# Patient Record
Sex: Female | Born: 1945
Health system: Southern US, Community
[De-identification: ages and names within clinical notes are randomized; demographics above are authoritative.]

## PROBLEM LIST (undated history)

## (undated) DIAGNOSIS — E785 Hyperlipidemia, unspecified: Secondary | ICD-10-CM

## (undated) DIAGNOSIS — D509 Iron deficiency anemia, unspecified: Secondary | ICD-10-CM

## (undated) DIAGNOSIS — Z85828 Personal history of other malignant neoplasm of skin: Secondary | ICD-10-CM

## (undated) DIAGNOSIS — F329 Major depressive disorder, single episode, unspecified: Secondary | ICD-10-CM

## (undated) DIAGNOSIS — G8929 Other chronic pain: Secondary | ICD-10-CM

## (undated) DIAGNOSIS — G43909 Migraine, unspecified, not intractable, without status migrainosus: Secondary | ICD-10-CM

## (undated) DIAGNOSIS — H269 Unspecified cataract: Secondary | ICD-10-CM

## (undated) DIAGNOSIS — N950 Postmenopausal bleeding: Secondary | ICD-10-CM

## (undated) DIAGNOSIS — F419 Anxiety disorder, unspecified: Secondary | ICD-10-CM

## (undated) DIAGNOSIS — C801 Malignant (primary) neoplasm, unspecified: Secondary | ICD-10-CM

## (undated) DIAGNOSIS — M810 Age-related osteoporosis without current pathological fracture: Secondary | ICD-10-CM

## (undated) DIAGNOSIS — Z9889 Other specified postprocedural states: Secondary | ICD-10-CM

## (undated) DIAGNOSIS — K219 Gastro-esophageal reflux disease without esophagitis: Secondary | ICD-10-CM

## (undated) DIAGNOSIS — K7689 Other specified diseases of liver: Secondary | ICD-10-CM

## (undated) DIAGNOSIS — M8580 Other specified disorders of bone density and structure, unspecified site: Secondary | ICD-10-CM

## (undated) DIAGNOSIS — M1990 Unspecified osteoarthritis, unspecified site: Secondary | ICD-10-CM

## (undated) DIAGNOSIS — F411 Generalized anxiety disorder: Secondary | ICD-10-CM

## (undated) DIAGNOSIS — Z973 Presence of spectacles and contact lenses: Secondary | ICD-10-CM

## (undated) DIAGNOSIS — D649 Anemia, unspecified: Secondary | ICD-10-CM

## (undated) DIAGNOSIS — M25569 Pain in unspecified knee: Secondary | ICD-10-CM

## (undated) DIAGNOSIS — T7840XA Allergy, unspecified, initial encounter: Secondary | ICD-10-CM

## (undated) DIAGNOSIS — C4491 Basal cell carcinoma of skin, unspecified: Secondary | ICD-10-CM

## (undated) DIAGNOSIS — J302 Other seasonal allergic rhinitis: Secondary | ICD-10-CM

## (undated) DIAGNOSIS — F32A Depression, unspecified: Secondary | ICD-10-CM

## (undated) DIAGNOSIS — R9389 Abnormal findings on diagnostic imaging of other specified body structures: Secondary | ICD-10-CM

## (undated) DIAGNOSIS — K635 Polyp of colon: Secondary | ICD-10-CM

## (undated) HISTORY — DX: Allergy, unspecified, initial encounter: T78.40XA

## (undated) HISTORY — DX: Presence of spectacles and contact lenses: Z97.3

## (undated) HISTORY — PX: MOHS SURGERY: SUR867

## (undated) HISTORY — DX: Polyp of colon: K63.5

## (undated) HISTORY — DX: Other specified diseases of liver: K76.89

## (undated) HISTORY — DX: Depression, unspecified: F32.A

## (undated) HISTORY — PX: COLONOSCOPY WITH PROPOFOL: SHX5780

## (undated) HISTORY — PX: APPENDECTOMY: SHX54

## (undated) HISTORY — PX: SKIN CANCER EXCISION: SHX779

## (undated) HISTORY — PX: COLONOSCOPY: SHX174

## (undated) HISTORY — DX: Iron deficiency anemia, unspecified: D50.9

## (undated) HISTORY — PX: ANKLE FRACTURE SURGERY: SHX122

## (undated) HISTORY — DX: Basal cell carcinoma of skin, unspecified: C44.91

## (undated) HISTORY — PX: JOINT REPLACEMENT: SHX530

## (undated) HISTORY — DX: Unspecified osteoarthritis, unspecified site: M19.90

## (undated) HISTORY — DX: Anemia, unspecified: D64.9

## (undated) HISTORY — PX: ORIF WRIST FRACTURE: SHX2133

## (undated) HISTORY — PX: FRACTURE SURGERY: SHX138

## (undated) HISTORY — DX: Age-related osteoporosis without current pathological fracture: M81.0

## (undated) HISTORY — DX: Unspecified cataract: H26.9

## (undated) HISTORY — DX: Major depressive disorder, single episode, unspecified: F32.9

---
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
## 1980-06-04 HISTORY — PX: TUBAL LIGATION: SHX77

## 1998-06-04 HISTORY — PX: ORIF ANKLE FRACTURE: SUR919

## 2000-06-04 HISTORY — PX: BREAST LUMPECTOMY: SHX2

## 2000-06-04 HISTORY — PX: BREAST EXCISIONAL BIOPSY: SUR124

## 2011-02-24 DIAGNOSIS — H9319 Tinnitus, unspecified ear: Secondary | ICD-10-CM | POA: Insufficient documentation

## 2011-02-24 DIAGNOSIS — G43909 Migraine, unspecified, not intractable, without status migrainosus: Secondary | ICD-10-CM | POA: Insufficient documentation

## 2011-02-24 DIAGNOSIS — F411 Generalized anxiety disorder: Secondary | ICD-10-CM | POA: Insufficient documentation

## 2011-02-24 DIAGNOSIS — D249 Benign neoplasm of unspecified breast: Secondary | ICD-10-CM | POA: Insufficient documentation

## 2011-02-26 DIAGNOSIS — I8390 Asymptomatic varicose veins of unspecified lower extremity: Secondary | ICD-10-CM | POA: Insufficient documentation

## 2011-06-05 HISTORY — PX: OTHER SURGICAL HISTORY: SHX169

## 2012-10-02 DIAGNOSIS — B86 Scabies: Secondary | ICD-10-CM | POA: Insufficient documentation

## 2013-01-29 LAB — LIPID PANEL
Cholesterol: 196 mg/dL (ref 0–200)
HDL: 54 mg/dL (ref 35–70)
LDL Cholesterol: 115 mg/dL
Triglycerides: 111 mg/dL (ref 40–160)

## 2013-01-29 LAB — CBC AND DIFFERENTIAL
HCT: 44 % (ref 36–46)
Hemoglobin: 14.4 g/dL (ref 12.0–16.0)
WBC: 4.6 10^3/mL

## 2013-01-29 LAB — T4, FREE
Albumin: 4.3
CO2: 27 mmol/L
Calcium: 9.4 mg/dL
Chloride: 106 mmol/L
Free T4: 0.8
Protein: 7
TSH: 0.818
Vit D, 25-Hydroxy: 31.9

## 2013-01-29 LAB — HEPATIC FUNCTION PANEL
ALT: 18 U/L (ref 7–35)
AST: 20 U/L (ref 13–35)
Alkaline Phosphatase: 43 U/L (ref 25–125)
Bilirubin, Total: 0.7 mg/dL

## 2013-01-29 LAB — BASIC METABOLIC PANEL
BUN: 18 mg/dL (ref 4–21)
Glucose: 105 mg/dL
Potassium: 3.8 mmol/L (ref 3.4–5.3)
Sodium: 140 mmol/L (ref 137–147)

## 2013-02-17 ENCOUNTER — Emergency Department
Admission: EM | Admit: 2013-02-17 | Discharge: 2013-02-17 | Disposition: A | Discharge status: Home / Self Care | Payer: Commercial Managed Care - PPO | Source: Home / Self Care | Attending: Family Medicine | Admitting: Family Medicine

## 2013-02-17 ENCOUNTER — Encounter: Payer: Self-pay | Admitting: *Deleted

## 2013-02-17 DIAGNOSIS — R21 Rash and other nonspecific skin eruption: Secondary | ICD-10-CM

## 2013-02-17 HISTORY — DX: Pain in unspecified knee: M25.569

## 2013-02-17 HISTORY — DX: Migraine, unspecified, not intractable, without status migrainosus: G43.909

## 2013-02-17 HISTORY — DX: Anxiety disorder, unspecified: F41.9

## 2013-02-17 HISTORY — DX: Other chronic pain: G89.29

## 2013-02-17 LAB — COMPREHENSIVE METABOLIC PANEL
ALT: 20 U/L (ref 0–35)
AST: 21 U/L (ref 0–37)
Alkaline Phosphatase: 46 U/L (ref 39–117)
CO2: 29 mEq/L (ref 19–32)
Sodium: 142 mEq/L (ref 135–145)
Total Bilirubin: 0.6 mg/dL (ref 0.3–1.2)
Total Protein: 6.9 g/dL (ref 6.0–8.3)

## 2013-02-17 MED ORDER — PERMETHRIN 5 % EX CREA: TOPICAL_CREAM | Freq: Once | CUTANEOUS | Status: DC

## 2013-02-17 MED ORDER — VALACYCLOVIR HCL 1 G PO TABS: 1000.0000 mg | ORAL_TABLET | Freq: Three times a day (TID) | ORAL | Status: DC

## 2013-02-17 NOTE — ED Notes (Signed)
Pt c/o rash on her buttocks x 1 day.

## 2013-02-17 NOTE — ED Provider Notes (Signed)
CSN: [id number]     Arrival date & time 02/17/13  0940 History   First MD Initiated Contact with Patient 02/17/13 1011     Chief Complaint  Patient presents with  . Rash   HPI  HPI  This patient complains of a RASH  Location: buttocks area   Onset: 1 day  Course: Has been helping with a local camp. Noticed itching on R buttock overnight as well as burning. Had previous diagnosis of scabies in the past. Wanted to make sure that she doesn't have scabies again. + burning over same time frame. Had shingles shot 1-11/2 years ago.    Self-treated with: nothing  Improvement with treatment: n/a  History  Itching: mild  Tenderness: no, burning over para gluteal area   New medications/antibiotics: no  Pet exposure: no  Recent travel or tropical exposure: no  New soaps, shampoos, detergent, clothing: no  Tick/insect exposure: no  Chemical Exposure: no  Red Flags  Feeling ill: no  Fever: no  Facial/tongue swelling/difficulty breathing: no  Diabetic or immunocompromised: no    Past Medical History  Diagnosis Date  . Anxiety   . Migraine   . Knee pain, chronic    Past Surgical History  Procedure Laterality Date  . Appendectomy    . Tubal ligation    . Skin cancer excision    . Upper endoscopy w/ sclerotherapy    . Ankle fracture surgery Left   . Vein surgery Left    Family History  Problem Relation Age of Onset  . Cancer Mother     brain  . Diabetes Father   . Hypertension Father   . CAD Father    History  Substance Use Topics  . Smoking status: Never Smoker   . Smokeless tobacco: Not on file  . Alcohol Use: No   OB History   Grav Para Term Preterm Abortions TAB SAB Ect Mult Living                 Review of Systems  All other systems reviewed and are negative.    Allergies  Excedrin migraine  Home Medications   Current Outpatient Rx  Name  Route  Sig  Dispense  Refill  . sertraline (ZOLOFT) 100 MG tablet   Oral   Take 100 mg by mouth daily.           . SUMAtriptan (IMITREX) 25 MG tablet   Oral   Take 25 mg by mouth every 2 (two) hours as needed for migraine. May repeat in 2 hours if headache persists or recurs.         . valACYclovir (VALTREX) 1000 MG tablet   Oral   Take 1 tablet (1,000 mg total) by mouth 3 (three) times daily.   21 tablet   0    BP 110/68  Pulse 70  Temp(Src) 98.1 F (36.7 C) (Oral)  Resp 16  Wt 189 lb (85.73 kg)  SpO2 97% Physical Exam  Constitutional: She appears well-developed and well-nourished.  HENT:  Head: Normocephalic and atraumatic.  Eyes: Conjunctivae are normal. Pupils are equal, round, and reactive to light.  Neck: Normal range of motion. Neck supple.  Cardiovascular: Normal rate and regular rhythm.   Pulmonary/Chest: Effort normal and breath sounds normal.  Abdominal: Soft.  Musculoskeletal: Normal range of motion.  Neurological: She is alert.  Skin:  ? rash on R lower buttock     ED Course  Procedures (including critical care time) Labs Review Labs Reviewed  VARICELLA ZOSTER ANTIBODY, IGM  VARICELLA ZOSTER ANTIBODY, IGG  HSV(HERPES SIMPLEX VRS) I + II AB-IGM  HSV(HERPES SIMPLEX VRS) I + II AB-IGG  COMPREHENSIVE METABOLIC PANEL   Imaging Review No results found.  MDM   1. Rash and nonspecific skin eruption    No discernible rash on exam. However, given outdoor exposure and recent history will cover her with permethrin for scabies coverage. There is also some concern for patient having a shingles/HSV prodrome given some radicular symptoms. Para gluteal burning does seem to fit distribution of S1/S2, though no definitive rash present currently. Will place patient on course of Valtrex for coverage. Will also check serologies including varicella zoster and HSV. Discussed with patient that if rash develops to come back in such that we can culture any potential lesions. Discuss infectious and dermatologic red flags at length with patient. Followup as needed.    The patient  and/or caregiver has been counseled thoroughly with regard to treatment plan and/or medications prescribed including dosage, schedule, interactions, rationale for use, and possible side effects and they verbalize understanding. Diagnoses and expected course of recovery discussed and will return if not improved as expected or if the condition worsens. Patient and/or caregiver verbalized understanding.         Doree Albee, MD 02/17/13 1046

## 2013-02-18 LAB — HSV(HERPES SIMPLEX VRS) I + II AB-IGG
HSV 1 Glycoprotein G Ab, IgG: 10.74 IV — ABNORMAL HIGH
HSV 2 Glycoprotein G Ab, IgG: 0.17 IV

## 2013-02-18 LAB — VARICELLA ZOSTER ANTIBODY, IGG: Varicella IgG: 2865 Index — ABNORMAL HIGH (ref <135.00)

## 2013-02-19 ENCOUNTER — Telehealth: Payer: Self-pay | Admitting: *Deleted

## 2013-03-03 ENCOUNTER — Ambulatory Visit (INDEPENDENT_AMBULATORY_CARE_PROVIDER_SITE_OTHER): Payer: Commercial Managed Care - PPO | Admitting: Family Medicine

## 2013-03-03 ENCOUNTER — Encounter: Payer: Self-pay | Admitting: Family Medicine

## 2013-03-03 VITALS — BP 116/69 | HR 66 | Ht 70.0 in | Wt 190.0 lb

## 2013-03-03 DIAGNOSIS — R238 Other skin changes: Secondary | ICD-10-CM

## 2013-03-03 DIAGNOSIS — E559 Vitamin D deficiency, unspecified: Secondary | ICD-10-CM

## 2013-03-03 DIAGNOSIS — M25562 Pain in left knee: Secondary | ICD-10-CM

## 2013-03-03 DIAGNOSIS — K7689 Other specified diseases of liver: Secondary | ICD-10-CM

## 2013-03-03 DIAGNOSIS — R233 Spontaneous ecchymoses: Secondary | ICD-10-CM

## 2013-03-03 DIAGNOSIS — F4010 Social phobia, unspecified: Secondary | ICD-10-CM | POA: Insufficient documentation

## 2013-03-03 DIAGNOSIS — L659 Nonscarring hair loss, unspecified: Secondary | ICD-10-CM

## 2013-03-03 DIAGNOSIS — Z0000 Encounter for general adult medical examination without abnormal findings: Secondary | ICD-10-CM

## 2013-03-03 DIAGNOSIS — G43009 Migraine without aura, not intractable, without status migrainosus: Secondary | ICD-10-CM

## 2013-03-03 DIAGNOSIS — Z85828 Personal history of other malignant neoplasm of skin: Secondary | ICD-10-CM

## 2013-03-03 NOTE — Progress Notes (Addendum)
  Subjective:    Patient ID: Lorraine Woods, female    DOB: [birth year], 67 y.o.   MRN: [id number]  HPI Here to estab care. She hs hx of social anxietey, migraine Hx of knee problems.  Had a bakers cyst fupture in her left knee back in July.  Given tramadol for pain relief.  Given volteran.  Given a brace and then got superficial blood clot in her superficial saphenous vein.  Then took baby ASA. Did get a shot and that helped for awhile, about a monbth.   Knee pain is getting worse lately. Hx of meniscal tear in the left knee.   Vit D def - Taking 2000IU daily.   Migraine - usually less than once a mohth overall but sometime they are clusteredd.  Imitrex works 100% of time.   Has felt more off balance for 6 months.  Notice it more when she gets up. Notices worse when bends over to file things.  No ear pain or pressure. Increase in headaches. No true room spinning or lightheadedness. Review of Systems  Constitutional: Negative for fever, diaphoresis and unexpected weight change.  HENT: Positive for rhinorrhea and tinnitus. Negative for hearing loss.   Eyes: Negative for visual disturbance.  Respiratory: Negative for cough and wheezing.   Cardiovascular: Negative for chest pain and palpitations.  Gastrointestinal: Negative for nausea, vomiting, diarrhea and blood in stool.  Genitourinary: Positive for enuresis. Negative for vaginal bleeding, vaginal discharge and difficulty urinating.  Musculoskeletal: Negative for myalgias and arthralgias.  Skin: Negative for rash.  Neurological: Positive for headaches.  Hematological: Negative for adenopathy. Bruises/bleeds easily.  Psychiatric/Behavioral: Negative for sleep disturbance and dysphoric mood. The patient is nervous/anxious.        Objective:   Physical Exam  Constitutional: She is oriented to person, place, and time. She appears well-developed and well-nourished.  HENT:  Head: Normocephalic and atraumatic.  Right Ear: External ear normal.   Left Ear: External ear normal.  Nose: Nose normal.  Tympanic membranes and canals are clear bilaterally. No fluid.  Eyes: Conjunctivae and EOM are normal. Pupils are equal, round, and reactive to light.  Wearing lenses  Neck: Neck supple. No thyromegaly present.  Cardiovascular: Normal rate, regular rhythm and normal heart sounds.   Pulmonary/Chest: Effort normal and breath sounds normal.  Lymphadenopathy:    She has no cervical adenopathy.  Neurological: She is alert and oriented to person, place, and time. No cranial nerve deficit.  Negative Dix-Hallpike maneuver.  Skin: Skin is warm and dry.  Psychiatric: She has a normal mood and affect. Her behavior is normal.          Assessment & Plan:  Vitamin D deficiency-due to recheck levels. Continue 2000 IUs daily.  Migraine-well controlled. Most headaches are once or less per month. Imitrex works well for her. She's not read refills today.  Balance problems-unclear etiology. No fluid on ear exam. Negative Dix-Hallpike maneuver today. Please let me know if changing or getting worse. Blood pressure looks great area  Social anxiety-well-controlled on Zoloft. She's been on for years and is tolerating it well without any side effects or problems.  History of liver cyst-will check liver enzymes.  She will schedule a followup physical in about 3 weeks.  Knee pain-she did get significant relief with the steroid injection. We'll give her the card for my partner Dr. Rodney Langton if she decides she would like to have another injection. Did discuss with her that we do limit these per year

## 2013-03-05 ENCOUNTER — Encounter: Payer: Self-pay | Admitting: *Deleted

## 2013-03-06 LAB — CBC
HCT: 39.8 % (ref 36.0–46.0)
Hemoglobin: 13.8 g/dL (ref 12.0–15.0)
MCH: 29.1 pg (ref 26.0–34.0)
MCHC: 34.7 g/dL (ref 30.0–36.0)
RBC: 4.75 MIL/uL (ref 3.87–5.11)

## 2013-03-06 LAB — COMPLETE METABOLIC PANEL WITH GFR
Alkaline Phosphatase: 43 U/L (ref 39–117)
BUN: 21 mg/dL (ref 6–23)
CO2: 27 mEq/L (ref 19–32)
Creat: 0.72 mg/dL (ref 0.50–1.10)
GFR, Est African American: >89 mL/min
GFR, Est Non African American: 88 mL/min
Glucose, Bld: 85 mg/dL (ref 70–99)
Sodium: 141 mEq/L (ref 135–145)
Total Bilirubin: 0.7 mg/dL (ref 0.3–1.2)

## 2013-03-06 LAB — LIPID PANEL
Cholesterol: 188 mg/dL (ref 0–200)
HDL: 53 mg/dL (ref >39)
Total CHOL/HDL Ratio: 3.5 Ratio

## 2013-03-24 ENCOUNTER — Encounter: Payer: Self-pay | Admitting: Family Medicine

## 2013-03-24 ENCOUNTER — Other Ambulatory Visit (HOSPITAL_COMMUNITY)
Admission: RE | Admit: 2013-03-24 | Discharge: 2013-03-24 | Disposition: A | Discharge status: Home / Self Care | Payer: Commercial Managed Care - PPO | Source: Ambulatory Visit | Attending: Family Medicine | Admitting: Family Medicine

## 2013-03-24 ENCOUNTER — Ambulatory Visit (INDEPENDENT_AMBULATORY_CARE_PROVIDER_SITE_OTHER): Payer: Commercial Managed Care - PPO | Admitting: Family Medicine

## 2013-03-24 VITALS — BP 125/73 | HR 66 | Wt 191.0 lb

## 2013-03-24 DIAGNOSIS — N889 Noninflammatory disorder of cervix uteri, unspecified: Secondary | ICD-10-CM

## 2013-03-24 DIAGNOSIS — Z1151 Encounter for screening for human papillomavirus (HPV): Secondary | ICD-10-CM | POA: Insufficient documentation

## 2013-03-24 DIAGNOSIS — N760 Acute vaginitis: Secondary | ICD-10-CM

## 2013-03-24 DIAGNOSIS — Z0000 Encounter for general adult medical examination without abnormal findings: Secondary | ICD-10-CM

## 2013-03-24 DIAGNOSIS — Z01419 Encounter for gynecological examination (general) (routine) without abnormal findings: Secondary | ICD-10-CM | POA: Insufficient documentation

## 2013-03-24 MED ORDER — SERTRALINE HCL 100 MG PO TABS: 100.0000 mg | ORAL_TABLET | Freq: Every day | ORAL | Status: DC

## 2013-03-24 MED ORDER — SUMATRIPTAN SUCCINATE 100 MG PO TABS: 100.0000 mg | ORAL_TABLET | ORAL | Status: DC | PRN

## 2013-03-24 NOTE — Patient Instructions (Signed)
Keep up a regular exercise program and make sure you are eating a healthy diet Try to eat 4 servings of dairy a day, or if you are lactose intolerant take a calcium with vitamin D daily.  Your vaccines are up to date.   

## 2013-03-24 NOTE — Progress Notes (Signed)
  Subjective:     Lorraine Woods is a 67 y.o. female and is here for a comprehensive physical exam. The patient reports no problems. Has had some vaginal itching for the past 2-3 days.  History   Social History  . Marital Status: Married    Spouse Name: John     Number of Children: 2  . Years of Education: N/A   Occupational History  . Echo sonographer     [employer] [facility]   Social History Main Topics  . Smoking status: Never Smoker   . Smokeless tobacco: Not on file  . Alcohol Use: Yes  . Drug Use: No  . Sexual Activity: Not on file   Other Topics Concern  . Not on file   Social History Narrative  . No narrative on file   Health Maintenance  Topic Date Due  . Pneumococcal Polysaccharide Vaccine Age 63 And Over  08/10/2011  . Influenza Vaccine  01/02/2014  . Mammogram  06/04/2014  . Colonoscopy  06/04/2016  . Tetanus/tdap  03/05/2019  . Zostavax  Completed    The following portions of the patient's history were reviewed and updated as appropriate: allergies, current medications, past family history, past medical history, past social history, past surgical history and problem list.  Review of Systems A comprehensive review of systems was negative.   Objective:     BP 125/73  Pulse 66  Wt 191 lb (86.637 kg)  BMI 27.41 kg/m2     BP 125/73  Pulse 66  Wt 191 lb (86.637 kg)  BMI 27.41 kg/m2 General appearance: alert, cooperative and appears stated age Head: Normocephalic, without obvious abnormality, atraumatic Eyes: conj clear, EOMi, PEERLA Ears: normal TM's and external ear canals both ears Nose: Nares normal. Septum midline. Mucosa normal. No drainage or sinus tenderness. Throat: lips, mucosa, and tongue normal; teeth and gums normal Neck: no adenopathy, no carotid bruit, no JVD, supple, symmetrical, trachea midline and thyroid not enlarged, symmetric, no tenderness/mass/nodules Back: symmetric, no curvature. ROM normal. No CVA tenderness. Lungs:  clear to auscultation bilaterally Breasts: normal appearance, no masses or tenderness Heart: regular rate and rhythm, S1, S2 normal, no murmur, click, rub or gallop Abdomen: soft, non-tender; bowel sounds normal; no masses,  no organomegaly Pelvic: external genitalia normal, no adnexal masses or tenderness, no cervical motion tenderness, rectovaginal septum normal, uterus normal size, shape, and consistency, vagina normal without discharge and cervix with a peduncular lesion at the 8 o'clock position Extremities: extremities normal, atraumatic, no cyanosis or edema Pulses: 2+ and symmetric Skin: Skin color, texture, turgor normal. No rashes or lesions Lymph nodes: Cervical, supraclavicular, and axillary nodes normal. Neurologic: Alert and oriented X 3, normal strength and tone. Normal symmetric reflexes. Normal coordination and gait  .   Assessment:    Healthy female exam.      Plan:     See After Visit Summary for Counseling Recommendations  Keep up a regular exercise program and make sure you are eating a healthy diet Try to eat 4 servings of dairy a day, or if you are lactose intolerant take a calcium with vitamin D daily.  Your vaccines are up to date.   Vaginitis- wet prep sent to evaluate for yeast infection.  Pap smear performed, will call with results as well.  Lesion on the cervix-will refer to GYN for further evaluation and possible biopsy.

## 2013-03-25 ENCOUNTER — Telehealth: Payer: Self-pay

## 2013-03-25 LAB — WET PREP, GENITAL
Clue Cells Wet Prep HPF POC: NONE SEEN
Trich, Wet Prep: NONE SEEN
Yeast Wet Prep HPF POC: NONE SEEN

## 2013-03-25 NOTE — Telephone Encounter (Signed)
Left message for patient that we received referral from Dr. Darra Lis and to call office to schedule appt.

## 2013-04-07 ENCOUNTER — Ambulatory Visit (INDEPENDENT_AMBULATORY_CARE_PROVIDER_SITE_OTHER): Payer: Commercial Managed Care - PPO | Admitting: Obstetrics & Gynecology

## 2013-04-07 ENCOUNTER — Encounter: Payer: Self-pay | Admitting: Obstetrics & Gynecology

## 2013-04-07 VITALS — BP 130/73 | HR 69 | Resp 16 | Ht 70.0 in | Wt 192.0 lb

## 2013-04-07 DIAGNOSIS — L293 Anogenital pruritus, unspecified: Secondary | ICD-10-CM

## 2013-04-07 DIAGNOSIS — N841 Polyp of cervix uteri: Secondary | ICD-10-CM

## 2013-04-07 DIAGNOSIS — L292 Pruritus vulvae: Secondary | ICD-10-CM

## 2013-04-07 MED ORDER — CLOBETASOL PROPIONATE 0.05 % EX OINT: TOPICAL_OINTMENT | CUTANEOUS | Status: DC

## 2013-04-07 NOTE — Progress Notes (Addendum)
  Subjective:    Patient ID: Lorraine Woods, female    DOB: [birth year], 67 y.o.   MRN: [id number]  HPI  Pt had yearly exam with Dr. Eppie Gibson recently and noted to have a cervical lesion.  Pt also c/o 2 weeks of vulvar itching.  Wet prep was negative.  Pt also c/o urinary incontinence and must wear a thin pad.  Pt is not sexually active at this time.  Review of Systems  Constitutional: Negative.   Respiratory: Negative.   Cardiovascular: Negative.   Gastrointestinal: Negative.   Genitourinary:       Urinary incont Vulvar itching  Psychiatric/Behavioral: Negative.        Objective:   Physical Exam  Vitals reviewed. Constitutional: She is oriented to person, place, and time. She appears well-developed and well-nourished. No distress.  HENT:  Head: Normocephalic and atraumatic.  Eyes: Conjunctivae are normal.  Pulmonary/Chest: Effort normal.  Abdominal: Soft.  Genitourinary: Vagina normal. No vaginal discharge found.  Vulvar normal Suspected endocervical polyp at 9 o'clock  Neurological: She is alert and oriented to person, place, and time.  Skin: Skin is warm and dry.  Psychiatric: She has a normal mood and affect.          Assessment & Plan:  67 yo female with   1-Endocervical polyp seen on exam--removed with Kevorkian.  Silver nitrate applied for hemostasis. 2-vulvar itching--No lesion seen.  Temovate for 3 weeks with taper.  RTC if not improving 3-Urinary incontinence--kegal exercises reviewed.

## 2013-04-16 ENCOUNTER — Encounter: Payer: Self-pay | Admitting: Obstetrics & Gynecology

## 2013-04-20 ENCOUNTER — Telehealth: Payer: Self-pay | Admitting: *Deleted

## 2013-04-20 NOTE — Telephone Encounter (Signed)
Pt was notified by Janit Bern, CMA of pathology results.

## 2013-04-21 ENCOUNTER — Encounter: Payer: Self-pay | Admitting: Obstetrics & Gynecology

## 2013-04-28 ENCOUNTER — Telehealth: Payer: Self-pay | Admitting: *Deleted

## 2013-04-28 NOTE — Telephone Encounter (Signed)
Pt notified of benign cervical polyp. 

## 2013-04-28 NOTE — Telephone Encounter (Signed)
Message copied by Granville Lewis on Tue Apr 28, 2013  9:46 AM ------      Message from: Lesly Dukes      Created: Tue Apr 28, 2013  7:18 AM       I was waiting fo r a relook.  Never got it from the pathologist.  Call her nad tell her all is normal.  No vaginitis nor precancer cells ------

## 2013-05-26 ENCOUNTER — Encounter: Payer: Self-pay | Admitting: Family Medicine

## 2013-05-27 ENCOUNTER — Other Ambulatory Visit: Payer: Self-pay | Admitting: Family Medicine

## 2013-05-29 ENCOUNTER — Telehealth: Payer: Self-pay | Admitting: *Deleted

## 2013-05-29 MED ORDER — SERTRALINE HCL 100 MG PO TABS: ORAL_TABLET | ORAL | Status: DC

## 2013-05-29 NOTE — Telephone Encounter (Signed)
Refill sent.Lorraine Woods [name]  

## 2013-07-22 ENCOUNTER — Encounter: Payer: Self-pay | Admitting: Family Medicine

## 2013-07-24 ENCOUNTER — Telehealth: Payer: Self-pay | Admitting: Family Medicine

## 2013-07-24 DIAGNOSIS — K7689 Other specified diseases of liver: Secondary | ICD-10-CM

## 2013-07-24 DIAGNOSIS — R1011 Right upper quadrant pain: Secondary | ICD-10-CM

## 2013-07-24 NOTE — Telephone Encounter (Signed)
Seth Bake, Will you please let Mrs. Rosebrook know that Dr. Jerilynn Mages is out on leave for a family emergency.  As coverage for her I'd encourage her to have a formal ultrasound of her liver so that we have a report on file.  Additionally, I'll send a referral to GI requesting a hepatic sub-specialist consult.  Please let us know if she has not been contacted about this by next week.

## 2013-07-24 NOTE — Telephone Encounter (Signed)
Left message on pt.'s vm.

## 2013-07-27 ENCOUNTER — Telehealth: Payer: Self-pay | Admitting: Internal Medicine

## 2013-07-27 NOTE — Telephone Encounter (Signed)
Left a message for patient to call me. 

## 2013-07-28 ENCOUNTER — Ambulatory Visit (INDEPENDENT_AMBULATORY_CARE_PROVIDER_SITE_OTHER): Payer: Commercial Managed Care - PPO

## 2013-07-28 ENCOUNTER — Telehealth: Payer: Self-pay | Admitting: Family Medicine

## 2013-07-28 DIAGNOSIS — K7689 Other specified diseases of liver: Secondary | ICD-10-CM

## 2013-07-28 DIAGNOSIS — R932 Abnormal findings on diagnostic imaging of liver and biliary tract: Secondary | ICD-10-CM

## 2013-07-28 DIAGNOSIS — R1011 Right upper quadrant pain: Secondary | ICD-10-CM

## 2013-07-28 NOTE — Telephone Encounter (Signed)
Pt notified of results; pt states when [facility] finally called her they insisted that she see Dr. Olevia Perches because that she saw her last. The pt last saw this Dr. Elwin Mocha years ago... And her first avail appt is not until may. Pt wanted to know if another referral could be place somewhere else. (maybe [facility]?)

## 2013-07-28 NOTE — Telephone Encounter (Signed)
Seth Bake, Will you please let Mrs. Jasper know that her formal ultrasound confirmed the presence of two cysts in the liver 11x10cm and 3.5x3.8cm.  The reading radiologist has recommended a CT scan of the abdomen which I've placed an order for in hopes that this makes her upcoming gastroenterology consult more productive.

## 2013-07-28 NOTE — Telephone Encounter (Signed)
New referral has been placed.  °

## 2013-07-28 NOTE — Telephone Encounter (Signed)
Called and the phone rang several times with no vm;will try later

## 2013-07-29 ENCOUNTER — Telehealth: Payer: Self-pay | Admitting: *Deleted

## 2013-07-29 NOTE — Telephone Encounter (Signed)
Spoke with patient and she states she is having a CT scan later this week or early next week. Scheduled OV on 08/07/13 at 2:00 PM with Dr. Olevia Perches.

## 2013-07-29 NOTE — Telephone Encounter (Signed)
Pt called and states she was able to get an appt with Dr. Olevia Perches next Friday she did get a call from [place] specialist also. She wanted to know if it was ok to go ahead and see Dr. Olevia Perches since that was who she preferred...Marland KitchenMarland KitchenShe can see whoever she wishes to see. Called home # and no answer. Closing the encounter

## 2013-07-29 NOTE — Telephone Encounter (Signed)
No PA required for CT ABD w/contrast since it is outpatient per [facility].  Oscar La, LPN

## 2013-07-31 ENCOUNTER — Telehealth: Payer: Self-pay | Admitting: *Deleted

## 2013-07-31 ENCOUNTER — Encounter: Payer: Self-pay | Admitting: *Deleted

## 2013-07-31 DIAGNOSIS — K7689 Other specified diseases of liver: Secondary | ICD-10-CM

## 2013-07-31 NOTE — Telephone Encounter (Signed)
Needs labs for CT. Pt is going to lab today

## 2013-08-01 LAB — BASIC METABOLIC PANEL WITH GFR
BUN: 17 mg/dL (ref 6–23)
CHLORIDE: 104 meq/L (ref 96–112)
CO2: 29 meq/L (ref 19–32)
Calcium: 9.4 mg/dL (ref 8.4–10.5)
Creat: 0.75 mg/dL (ref 0.50–1.10)
GFR, Est African American: >89 mL/min
GFR, Est Non African American: 83 mL/min
Glucose, Bld: 93 mg/dL (ref 70–99)
Potassium: 4.1 mEq/L (ref 3.5–5.3)
SODIUM: 140 meq/L (ref 135–145)

## 2013-08-04 ENCOUNTER — Encounter (HOSPITAL_BASED_OUTPATIENT_CLINIC_OR_DEPARTMENT_OTHER): Payer: Self-pay

## 2013-08-04 ENCOUNTER — Encounter: Payer: Self-pay | Admitting: Family Medicine

## 2013-08-04 ENCOUNTER — Ambulatory Visit (HOSPITAL_BASED_OUTPATIENT_CLINIC_OR_DEPARTMENT_OTHER)
Admission: RE | Admit: 2013-08-04 | Discharge: 2013-08-04 | Disposition: A | Discharge status: Home / Self Care | Payer: Commercial Managed Care - PPO | Source: Ambulatory Visit | Attending: Family Medicine | Admitting: Family Medicine

## 2013-08-04 DIAGNOSIS — K7689 Other specified diseases of liver: Secondary | ICD-10-CM | POA: Insufficient documentation

## 2013-08-04 DIAGNOSIS — R911 Solitary pulmonary nodule: Secondary | ICD-10-CM | POA: Insufficient documentation

## 2013-08-04 HISTORY — DX: Malignant (primary) neoplasm, unspecified: C80.1

## 2013-08-04 IMAGING — CT CT ABDOMEN W/ CM
2 of 5 series · 15 of 46 positions shown, 17 images · IV contrast (APPLIED)
Comparison: US ABDOMEN COMPLETE dated [DATE]

CLINICAL DATA: Right upper quadrant abdominal pain. Follow-up
hepatic cysts demonstrated on ultrasound. History of nasal basal
cell carcinoma.

EXAM:
CT ABDOMEN WITH CONTRAST
TECHNIQUE: Multidetector CT imaging of the abdomen was performed using the
standard protocol following bolus administration of intravenous
contrast.
CONTRAST:  100mL OMNIPAQUE IOHEXOL 300 MG/ML  SOLN

[Series 2: abd/pelvis 5.0 b31f · axial · 0.82mm/px · z∈[-252,+8]mm · 12 of 61 slices shown, 14 images]
[im 5/61  soft-tissue]
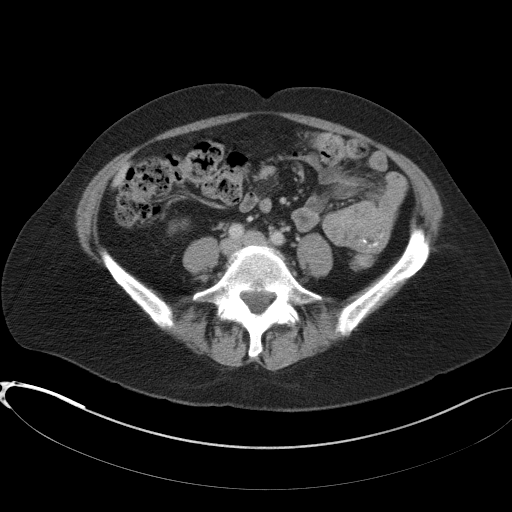
[im 5/61  bone]
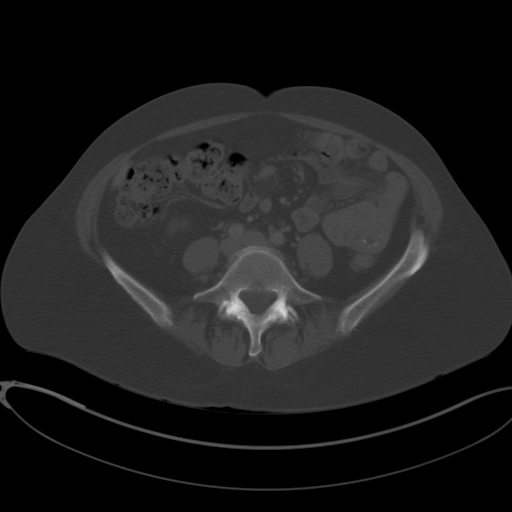
[im 9/61  soft-tissue]
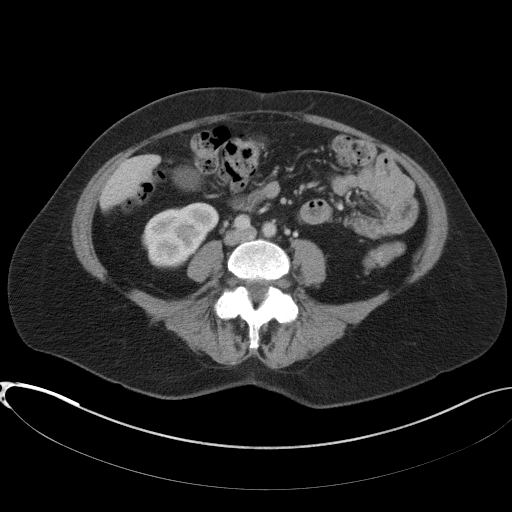
[im 13/61  soft-tissue]
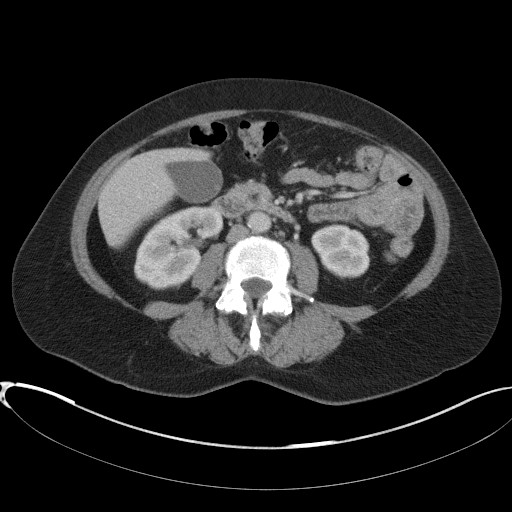
[im 21/61  soft-tissue]
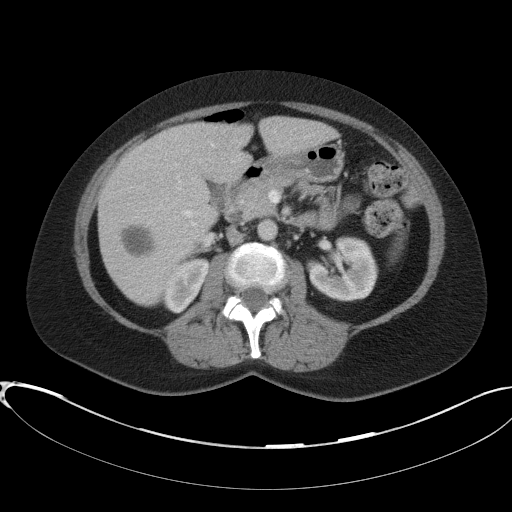
[im 25/61  soft-tissue]
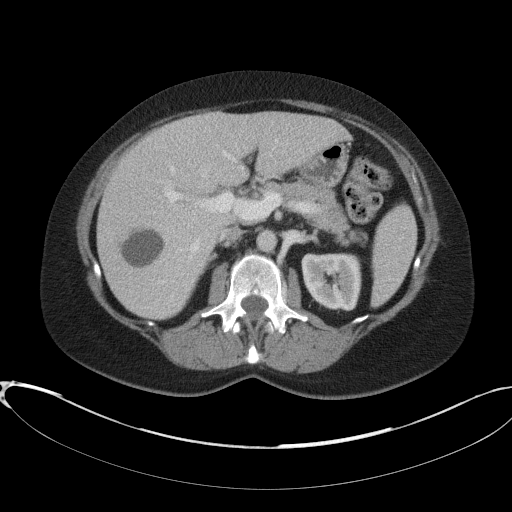
[im 29/61  soft-tissue]
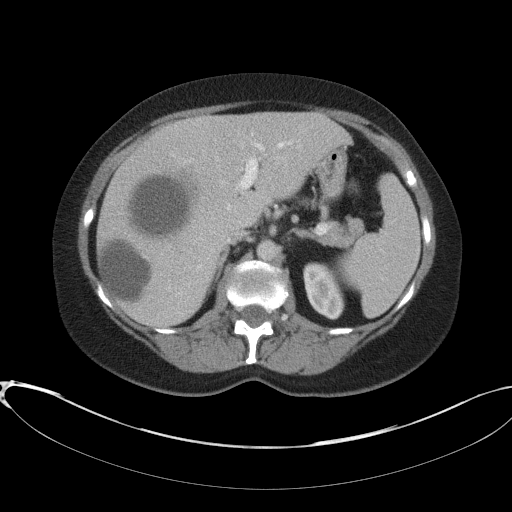
[im 33/61  soft-tissue]
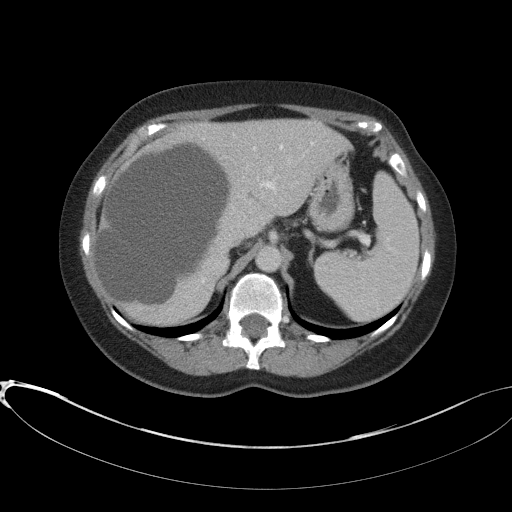
[im 37/61  soft-tissue]
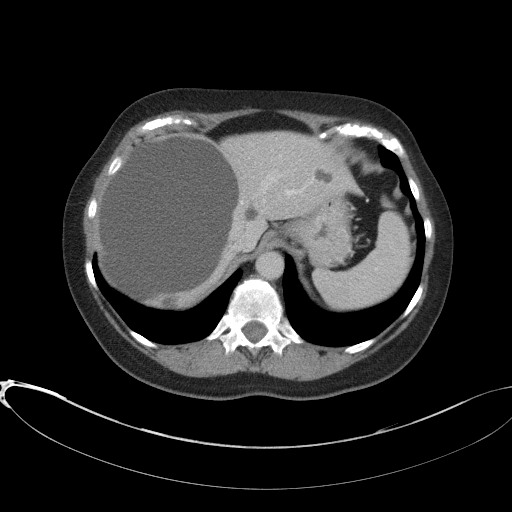
[im 41/61  soft-tissue]
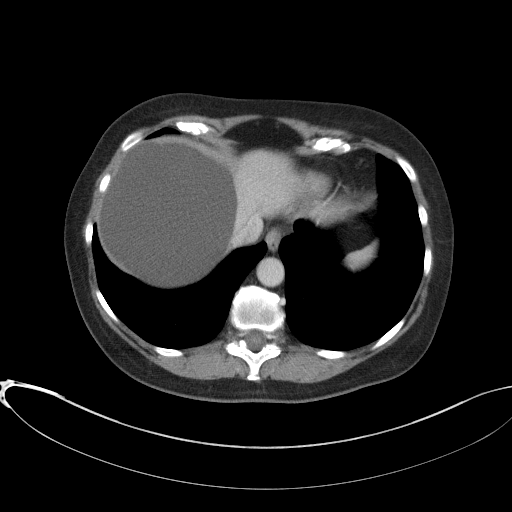
[im 41/61  bone]
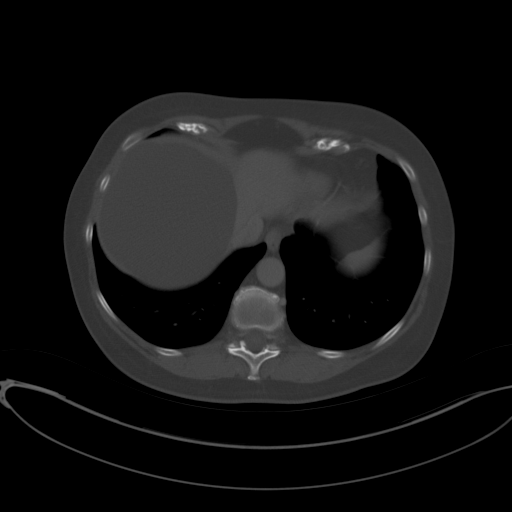
[im 49/61  soft-tissue]
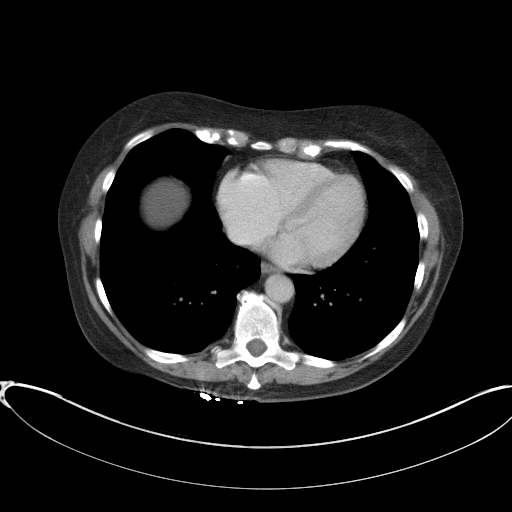
[im 53/61  soft-tissue]
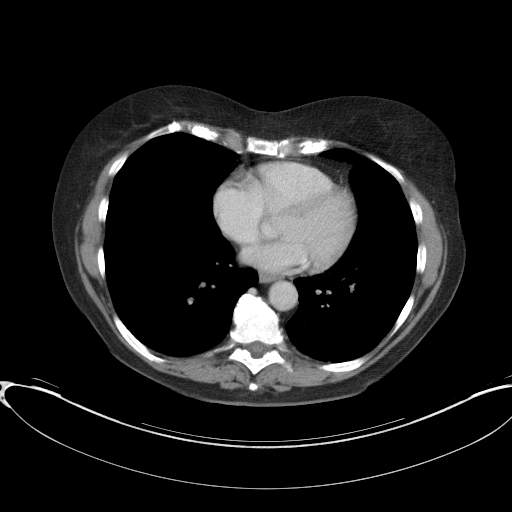
[im 57/61  soft-tissue]
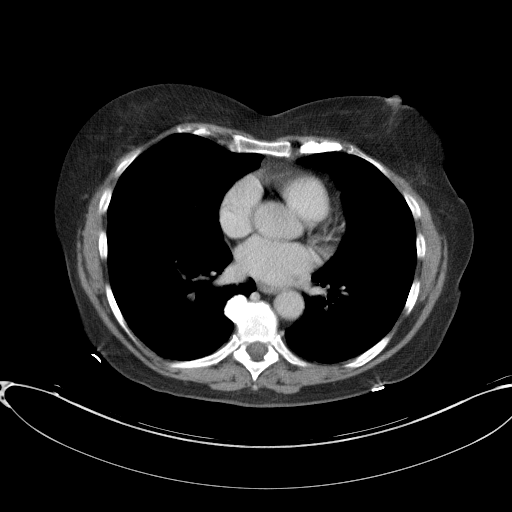

[Series 5: abd/pelvis 3.0 coronal · coronal · 0.70mm/px · 3 of 94 slices shown]
[im 32/94  soft-tissue]
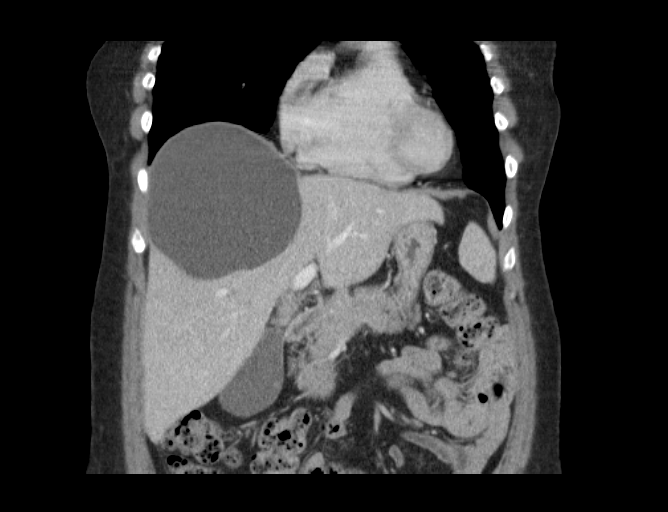
[im 42/94  soft-tissue]
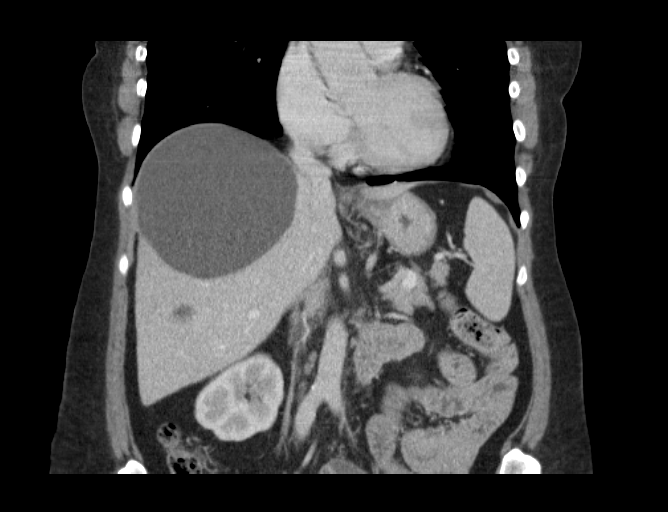
[im 52/94  soft-tissue]
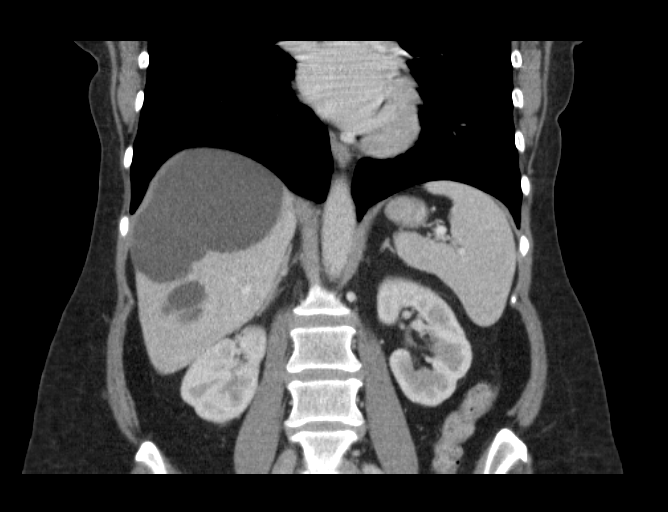

[15 of 46 positions shown; findings below may reference images not displayed]

FINDINGS: There is a 7 mm left lower lobe nodule on image 7. This appears
somewhat tubular and could be vascular; its evaluation is mildly
limited by motion. No other basilar pulmonary nodules are
identified. There is mild dependent atelectasis or scarring in both
lung bases. There is no significant pleural or pericardial effusion.

Multiple hepatic cysts are demonstrated. The dominant lesion is in
the dome of the right hepatic lobe, measuring 11.2 by 12.9 cm on
image 24. There is an immediately adjacent mildly septated cystic
lesion measuring 7.6 x 4.7 cm on image 30. There is a 3.8 cm cystic
lesion inferiorly in the right hepatic lobe on image 39. Multiple
other smaller lesions are identified. No enhancing liver lesions,
thickened septations or associated calcifications are evident.

There is a possible small gallbladder polyp on image 49,
corresponding with the ultrasound finding. The gallbladder otherwise
appears normal. There is no biliary dilatation. There are no
suspicious pancreatic findings. There is invagination of
retroperitoneal fat between the pancreatic body and the duodenum on
axial images 46 through 48. There is no pancreatic ductal
dilatation. The spleen appears normal.

Both adrenal glands and kidneys appear normal. There is no
hydronephrosis.

The stomach and visualized portions of the small bowel and colon
appear normal. No inflammatory changes or enlarged lymph nodes are
demonstrated.

There are degenerative changes within the lower lumbar spine. No
worrisome osseous findings are demonstrated.

The pelvis was not imaged.
IMPRESSION: 1. There are multiple hepatic cysts, some of which demonstrate thin
septations. No aggressive features are demonstrated.
2. Possible small gallbladder polyp as correlated with prior
ultrasound.
3. Left lower lobe pulmonary nodule versus small AVM. If the patient
is at high risk for bronchogenic carcinoma, follow-up chest CT at
3-[PO] is recommended. If the patient is at low risk for
bronchogenic carcinoma, follow-up chest CT at 6-12 months is
recommended. This recommendation follows the consensus statement:
Guidelines for Management of Small Pulmonary Nodules Detected on CT
Scans: A Statement from the [HOSPITAL] as published in

## 2013-08-04 MED ORDER — IOHEXOL 300 MG/ML  SOLN
100.0000 mL | Freq: Once | INTRAMUSCULAR | Status: AC | PRN
  Administered 2013-08-04: 100 mL via INTRAVENOUS

## 2013-08-07 ENCOUNTER — Ambulatory Visit (INDEPENDENT_AMBULATORY_CARE_PROVIDER_SITE_OTHER): Payer: Commercial Managed Care - PPO | Admitting: Internal Medicine

## 2013-08-07 ENCOUNTER — Other Ambulatory Visit (INDEPENDENT_AMBULATORY_CARE_PROVIDER_SITE_OTHER): Payer: Commercial Managed Care - PPO

## 2013-08-07 ENCOUNTER — Encounter: Payer: Self-pay | Admitting: Internal Medicine

## 2013-08-07 ENCOUNTER — Ambulatory Visit: Payer: Commercial Managed Care - PPO | Admitting: Sports Medicine

## 2013-08-07 VITALS — BP 128/70 | HR 80 | Ht 69.0 in | Wt 192.4 lb

## 2013-08-07 DIAGNOSIS — R1011 Right upper quadrant pain: Secondary | ICD-10-CM

## 2013-08-07 DIAGNOSIS — K7689 Other specified diseases of liver: Secondary | ICD-10-CM

## 2013-08-07 LAB — HEPATIC FUNCTION PANEL
ALT: 19 U/L (ref 0–35)
AST: 24 U/L (ref 0–37)
Albumin: 4.3 g/dL (ref 3.5–5.2)
Alkaline Phosphatase: 38 U/L — ABNORMAL LOW (ref 39–117)
BILIRUBIN DIRECT: 0.2 mg/dL (ref 0.0–0.3)
BILIRUBIN TOTAL: 0.8 mg/dL (ref 0.3–1.2)
Total Protein: 7.3 g/dL (ref 6.0–8.3)

## 2013-08-07 LAB — AMYLASE: Amylase: 66 U/L (ref 27–131)

## 2013-08-07 LAB — LIPASE: Lipase: 34 U/L (ref 11.0–59.0)

## 2013-08-07 NOTE — Progress Notes (Signed)
Lorraine Woods [facility] 1945-06-21 [id number]  Note: This dictation was prepared with Dragon digital system. Any transcriptional errors that result from this procedure are unintentional.   History of Present Illness:  This is a 68 year old white female who had a sudden onset of right upper quadrant pain localized to the right costal margin and radiating to the back. The pain  started about 3 weeks ago. It is relieved when she lays down at night and it is worse when she is up, if she coughs or if she reaches with her arms or bends over. It is not associated with eating or with having bowel movements. An upper abdominal ultrasound showed a gallbladder polyp, normal common bile duct of 4.3 mm and large cyst in the right lobe of the liver. A subsequent CT scan of the abdomen on 07/28/2013 showed multiple hepatic cysts, the largest one in the right lobe was 11.2 x12.9 cm with adjacent septated cysts of 7.6 cm and 4.7 cm. There was another 3.8 cm cyst in the right lobe. I saw the patient about 25 years ago for unrelated problems and at that time an upper abdominal ultrasound showed a small liver cyst. She had a screening colonoscopy about 7 years ago. Liver function tests in September and October 2014 were normal. She doesn't drink excessive alcohol. There is no history of liver disease or hepatitis.    Past Medical History  Diagnosis Date  . Anxiety   . Migraine   . Knee pain, chronic   . Wears glasses   . Liver cyst   . Basal cell carcinoma     nose    Past Surgical History  Procedure Laterality Date  . Appendectomy  1959  . Tubal ligation  1982  . Skin cancer excision    . Upper endoscopy w/ sclerotherapy    . Ankle fracture surgery Left   . Vein surgery Left   . Breast lumpectomy Right     on HRT, fibromadenoma    Allergies  Allergen Reactions  . Excedrin Migraine [Aspirin-Acetaminophen-Caffeine] Other (See Comments)    Bruising     Family history and social history have been  reviewed.  Review of Systems: Negative for dyspepsia dysphagia heartburn  The remainder of the 10 point ROS is negative except as outlined in the H&P  Physical Exam: General Appearance Well developed, in no distress healthy appearing Eyes  Non icteric  HEENT  Non traumatic, normocephalic  Mouth No lesion, tongue papillated, no cheilosis Neck Supple without adenopathy, thyroid not enlarged, no carotid bruits, no JVD Lungs Clear to auscultation bilaterally COR Normal S1, normal S2, regular rhythm, no murmur, quiet precordium Abdomen soft nontender with normoactive bowel sounds. Liver edge at costal margin. Overall span about 15 cm by percussion. No CVA tenderness, no tenderness of the liver area and spleen not enlarged Rectal not done Extremities  No pedal edema Skin No lesions Neurological Alert and oriented x 3 Psychological Normal mood and affect  Assessment and Plan:   Problem #1 interval enlargement of hepatic cysts; the largest being 11.2x12.9 cm in the right lobe of the liver. Her symptoms are likely related to the pressure from the cysts. The RUQ abd.pain is not associated with meals or bowel function. The pain feels like a pressure and it is partly positional.The patient has not taken any estrogens. We will repeat liver function tests today. I feel that the cyst should  be assessed from a surgical standpoint. In order to prevent the dominant cyst from accumulating  it may have  To be  unroofed. We will obtain a surgical opinion from Dr. Barry Dienes. Patient agrees with the plan.     [name] 08/07/2013

## 2013-08-07 NOTE — Patient Instructions (Signed)
Your physician has requested that you go to the basement for the following lab work before leaving today: Hepatic Function, Amylase, Lipase  You have been scheduled for an appointment with Dr Barry Dienes at [facility]. Your appointment is on __________ at _________. Please arrive at __________ for registration. Make certain to bring a list of current medications, including any over the counter medications or vitamins. Also bring your co-pay if you have one as well as your insurance cards. [facility] is located at [address], [address]. Should you need to reschedule your appointment, please contact them at [phone].  CC: Dr [name], Dr Barry Dienes

## 2013-08-11 ENCOUNTER — Telehealth: Payer: Self-pay | Admitting: Internal Medicine

## 2013-08-11 DIAGNOSIS — K7689 Other specified diseases of liver: Secondary | ICD-10-CM

## 2013-08-11 NOTE — Telephone Encounter (Signed)
I have advised patient that per [facility] @  [facility], new patient referral coordinator has been out sick and they are "back logged" on appointments at this time. I advised that I will be in contact with them about appointment when we have gotten time and date.

## 2013-08-13 NOTE — Telephone Encounter (Signed)
I have spoken to [facility] @ [place] and she states that patient's information has been sent to [facility], Dr Marlowe Aschoff nurse to get patient worked in sooner for an appointment. I have advised patient's husband of this.

## 2013-08-17 ENCOUNTER — Encounter (INDEPENDENT_AMBULATORY_CARE_PROVIDER_SITE_OTHER): Payer: Self-pay | Admitting: General Surgery

## 2013-08-17 ENCOUNTER — Ambulatory Visit (INDEPENDENT_AMBULATORY_CARE_PROVIDER_SITE_OTHER): Payer: Commercial Managed Care - PPO | Admitting: General Surgery

## 2013-08-17 VITALS — BP 118/68 | HR 80 | Temp 100.0°F | Resp 14 | Ht 70.0 in | Wt 191.8 lb

## 2013-08-17 DIAGNOSIS — K824 Cholesterolosis of gallbladder: Secondary | ICD-10-CM

## 2013-08-17 DIAGNOSIS — K7689 Other specified diseases of liver: Secondary | ICD-10-CM

## 2013-08-17 NOTE — Patient Instructions (Signed)
Laparoscopic Cholecystectomy Laparoscopic cholecystectomy is surgery to remove the gallbladder. The gallbladder is located in the upper right part of the abdomen, behind the liver. It is a storage sac for bile produced in the liver. Bile aids in the digestion and absorption of fats. Cholecystectomy is often done for inflammation of the gallbladder (cholecystitis). This condition is usually caused by a buildup of gallstones (cholelithiasis) in your gallbladder. Gallstones can block the flow of bile, resulting in inflammation and pain. In severe cases, emergency surgery may be required. When emergency surgery is not required, you will have time to prepare for the procedure. Laparoscopic surgery is an alternative to open surgery. Laparoscopic surgery has a shorter recovery time. Your common bile duct may also need to be examined during the procedure. If stones are found in the common bile duct, they may be removed. LET [facility] CARE PROVIDER KNOW ABOUT:  Any allergies you have.  All medicines you are taking, including vitamins, herbs, eye drops, creams, and over-the-counter medicines.  Previous problems you or members of your family have had with the use of anesthetics.  Any blood disorders you have.  Previous surgeries you have had.  Medical conditions you have. RISKS AND COMPLICATIONS Generally, this is a safe procedure. However, as with any procedure, complications can occur. Possible complications include:  Infection.  Damage to the common bile duct, nerves, arteries, veins, or other internal organs such as the stomach, liver, or intestines.  Bleeding.  A stone may remain in the common bile duct.  A bile leak from the cyst duct that is clipped when your gallbladder is removed.  The need to convert to open surgery, which requires a larger incision in the abdomen. This may be necessary if your surgeon thinks it is not safe to continue with a laparoscopic procedure. BEFORE THE  PROCEDURE  Ask your health care provider about changing or stopping any regular medicines. You will need to stop taking aspirin or blood thinners at least 5 days prior to surgery.  Do not eat or drink anything after midnight the night before surgery.  Let your health care provider know if you develop a cold or other infectious problem before surgery. PROCEDURE   You will be given medicine to make you sleep through the procedure (general anesthetic). A breathing tube will be placed in your mouth.  When you are asleep, your surgeon will make several small cuts (incisions) in your abdomen.  A thin, lighted tube with a tiny camera on the end (laparoscope) is inserted through one of the small incisions. The camera on the laparoscope sends a picture to a TV screen in the operating room. This gives the surgeon a good view inside your abdomen.  A gas will be pumped into your abdomen. This expands your abdomen so that the surgeon has more room to perform the surgery.  Other tools needed for the procedure are inserted through the other incisions. The gallbladder is removed through one of the incisions.  After the removal of your gallbladder, the incisions will be closed with stitches, staples, or skin glue. AFTER THE PROCEDURE  You will be taken to a recovery area where your progress will be checked often.  You may be allowed to go home the same day if your pain is controlled and you can tolerate liquids.    Main risks are bleeding, infection, damage to adjacent structures, risk for needing additional surgeries or procedures, risk of bile leak, risk of cardiac or pulmonary issues, risk of blood  clot.

## 2013-08-19 DIAGNOSIS — K824 Cholesterolosis of gallbladder: Secondary | ICD-10-CM | POA: Insufficient documentation

## 2013-08-19 NOTE — Assessment & Plan Note (Addendum)
Pt has symptomatic large right sided liver cyst.   Will plan laparoscopic liver cyst unroofing and cholecystectomy.   I discussed the proximity of the portal vein and hepatic vein branches to the cysts.    The gallbladder will need to go for surgical purposes, but she also has gallbladder polyp and post prandial pain.    We reviewed the risks of bleeding, infection, recurrent cyst, bile leak, fatigue, pain, and other cardiopulmonary complications.    I described the incisions.  She is eager to get this done since she has significant discomfort.

## 2013-08-19 NOTE — Assessment & Plan Note (Signed)
Pt will get cholecystectomy.  The surgical procedure was described to the patient in detail.  The patient was given Neurosurgeon. .  I discussed the incision type and location, the location of the gallbladder, the anatomy of the bile ducts and arteries, and the typical progression of surgery.  I discussed the possibility of converting to an open operation.  I advised of the risks of bleeding, infection, damage to other structures (such as the bile duct, intestine or liver), bile leak, need for other procedures or surgeries, and post op diarrhea/constipation.  We discussed the risk of blood clot.  We discussed the recovery period and post operative restrictions.  The patient was advised against taking blood thinners the week before surgery.

## 2013-08-19 NOTE — Progress Notes (Signed)
Chief Complaint  Patient presents with  . New Evaluation    liver cyst Right side    HISTORY: Patient is a 68 year old female who has been experiencing around 4 weeks of severe right upper quadrant pain and fullness. She describes this as very achy discomfort. She has also started to have a cough and back pain. When she takes a deep breath it hurts worse. It also hurts worse after eating. She has experienced some early satiety as well, but no weight loss.  Dr. Olevia Perches of gastroenterology saw her and found a very large liver cyst when she performed an ultrasound. The patient subsequently had a CT. She has an extremely large dominant right hepatic cyst that does appear to stretch the capsule significantly. There is adjacent cyst that appears to communicate with it. These both appear very bland and banal.  There is another smaller cyst in the parenchyma of the liver that does not appear to communicate with the other cysts. There are several other small cysts in the left lobe. She denies fevers and chills. She has no symptoms of heart failure.  Past Medical History  Diagnosis Date  . Anxiety   . Migraine   . Knee pain, chronic   . Wears glasses   . Liver cyst   . Basal cell carcinoma     nose    Past Surgical History  Procedure Laterality Date  . Appendectomy  1959  . Tubal ligation  1982  . Skin cancer excision    . Upper endoscopy w/ sclerotherapy    . Ankle fracture surgery Left   . Vein surgery Left   . Breast lumpectomy Right     on HRT, fibromadenoma  . Orif ankle fracture  2000    left  . Breast excisional biopsy  2002    noncancerous lump  . Skin cancer excision      basil cell lerion on nose    Current Outpatient Prescriptions  Medication Sig Dispense Refill  . BIOTIN PO Take 1,000 mcg by mouth daily.      . Cholecalciferol (VITAMIN D3) 2000 UNITS TABS Take by mouth daily.      . minoxidil (ROGAINE) 2 % external solution Apply topically 2 (two) times daily.      .  Multiple Vitamin (MULTIVITAMIN) capsule Take 1 capsule by mouth daily.      . sertraline (ZOLOFT) 100 MG tablet TAKE ONE TABLET BY MOUTH DAILY  90 tablet  1  . SUMAtriptan (IMITREX) 100 MG tablet Take 1 tablet (100 mg total) by mouth every 2 (two) hours as needed for migraine. May repeat in 2 hours if headache persists or recurs.  9 tablet  3   No current facility-administered medications for this visit.     Allergies  Allergen Reactions  . Excedrin Migraine [Aspirin-Acetaminophen-Caffeine] Other (See Comments)    Bruising      Family History  Problem Relation Age of Onset  . Brain cancer Mother 33  . Diabetes Father   . Hypertension Father   . CAD Father   . Heart disease Father   . Colon polyps Son     Puetz-jehgers syndrome     History   Social History  . Marital Status: Married    Spouse Name: John     Number of Children: 2  . Years of Education: N/A   Occupational History  . Echo sonographer     [place] History Main Topics  . Smoking  status: Never Smoker   . Smokeless tobacco: Never Used  . Alcohol Use: Yes     Comment: occasional  . Drug Use: No  . Sexual Activity: None   Other Topics Concern  . None   Social History Narrative  . None     REVIEW OF SYSTEMS - PERTINENT POSITIVES ONLY: 12 point review of systems negative other than HPI and PMH except for headaches, easy bruising, and RUQ abdominal pain.    EXAM: Filed Vitals:   08/17/13 1408  BP: 118/68  Pulse: 80  Temp: 100 F (37.8 C)  Resp: 14    Wt Readings from Last 3 Encounters:  08/17/13 191 lb 12.8 oz (87 kg)  08/07/13 192 lb 6 oz (87.261 kg)  04/07/13 192 lb (87.091 kg)     Gen:  No acute distress.  Well nourished and well groomed.   Neurological: Alert and oriented to person, place, and time. Coordination normal.  Head: Normocephalic and atraumatic.  Eyes: Conjunctivae are normal. Pupils are equal, round, and reactive to light. No scleral icterus.   Neck: Normal range of motion. Neck supple. No tracheal deviation or thyromegaly present.  Cardiovascular: Normal rate, regular rhythm, normal heart sounds and intact distal pulses.  Exam reveals no gallop and no friction rub.  No murmur heard. Respiratory: Effort normal.  No respiratory distress. No chest wall tenderness. Breath sounds normal.  No wheezes, rales or rhonchi.  GI: Soft. Bowel sounds are normal. The abdomen is soft and nondistended.  There is some RUQ tenderness.  There is no rebound and no guarding.  Musculoskeletal: Normal range of motion. Extremities are nontender.  Lymphadenopathy: No cervical, preauricular, postauricular or axillary adenopathy is present Skin: Skin is warm and dry. No rash noted. No diaphoresis. No erythema. No pallor. No clubbing, cyanosis, or edema.   Psychiatric: Normal mood and affect. Behavior is normal. Judgment and thought content normal.    LABORATORY RESULTS: Available labs are reviewed   Recent Results (from the past 2160 hour(s))  BASIC METABOLIC PANEL WITH GFR     Status: None   Collection Time    07/31/13 12:01 AM      Result Value Ref Range   Sodium 140  135 - 145 mEq/L   Potassium 4.1  3.5 - 5.3 mEq/L   Chloride 104  96 - 112 mEq/L   CO2 29  19 - 32 mEq/L   Glucose, Bld 93  70 - 99 mg/dL   BUN 17  6 - 23 mg/dL   Creat 0.75  0.50 - 1.10 mg/dL   Calcium 9.4  8.4 - 10.5 mg/dL   GFR, Est African American >89     GFR, Est Non African American 83     Comment:       The estimated GFR is a calculation valid for adults (>=72 years old)     that uses the CKD-EPI algorithm to adjust for age and sex. It is       not to be used for children, pregnant women, hospitalized patients,        patients on dialysis, or with rapidly changing kidney function.     According to the NKDEP, eGFR >89 is normal, 60-89 shows mild     impairment, 30-59 shows moderate impairment, 15-29 shows severe     impairment and <15 is ESRD.        HEPATIC FUNCTION PANEL      Status: Abnormal   Collection Time    08/07/13  2:59  PM      Result Value Ref Range   Total Bilirubin 0.8  0.3 - 1.2 mg/dL   Bilirubin, Direct 0.2  0.0 - 0.3 mg/dL   Alkaline Phosphatase 38 (*) 39 - 117 U/L   AST 24  0 - 37 U/L   ALT 19  0 - 35 U/L   Total Protein 7.3  6.0 - 8.3 g/dL   Albumin 4.3  3.5 - 5.2 g/dL  AMYLASE     Status: None   Collection Time    08/07/13  2:59 PM      Result Value Ref Range   Amylase 66  27 - 131 U/L  LIPASE     Status: None   Collection Time    08/07/13  2:59 PM      Result Value Ref Range   Lipase 34.0  11.0 - 59.0 U/L     RADIOLOGY RESULTS: See E-Chart or I-Site for most recent results.  Images and reports are reviewed.  Ct Abdomen W Contrast  08/04/2013   CLINICAL DATA:  Right upper quadrant abdominal pain. Follow-up hepatic cysts demonstrated on ultrasound. History of nasal basal cell carcinoma.  EXAM: CT ABDOMEN WITH CONTRAST  TECHNIQUE: Multidetector CT imaging of the abdomen was performed using the standard protocol following bolus administration of intravenous contrast.  CONTRAST:  187mL OMNIPAQUE IOHEXOL 300 MG/ML  SOLN  COMPARISON:  US ABDOMEN COMPLETE dated 07/28/2013  FINDINGS: There is a 7 mm left lower lobe nodule on image 7. This appears somewhat tubular and could be vascular; its evaluation is mildly limited by motion. No other basilar pulmonary nodules are identified. There is mild dependent atelectasis or scarring in both lung bases. There is no significant pleural or pericardial effusion.  Multiple hepatic cysts are demonstrated. The dominant lesion is in the dome of the right hepatic lobe, measuring 11.2 by 12.9 cm on image 24. There is an immediately adjacent mildly septated cystic lesion measuring 7.6 x 4.7 cm on image 30. There is a 3.8 cm cystic lesion inferiorly in the right hepatic lobe on image 39. Multiple other smaller lesions are identified. No enhancing liver lesions, thickened septations or associated calcifications are  evident.  There is a possible small gallbladder polyp on image 49, corresponding with the ultrasound finding. The gallbladder otherwise appears normal. There is no biliary dilatation. There are no suspicious pancreatic findings. There is invagination of retroperitoneal fat between the pancreatic body and the duodenum on axial images 46 through 48. There is no pancreatic ductal dilatation. The spleen appears normal.  Both adrenal glands and kidneys appear normal. There is no hydronephrosis.  The stomach and visualized portions of the small bowel and colon appear normal. No inflammatory changes or enlarged lymph nodes are demonstrated.  There are degenerative changes within the lower lumbar spine. No worrisome osseous findings are demonstrated.  The pelvis was not imaged.  IMPRESSION: 1. There are multiple hepatic cysts, some of which demonstrate thin septations. No aggressive features are demonstrated. 2. Possible small gallbladder polyp as correlated with prior ultrasound. 3. Left lower lobe pulmonary nodule versus small AVM. If the patient is at high risk for bronchogenic carcinoma, follow-up chest CT at 3-10months is recommended. If the patient is at low risk for bronchogenic carcinoma, follow-up chest CT at 6-12 months is recommended. This recommendation follows the consensus statement: Guidelines for Management of Small Pulmonary Nodules Detected on CT Scans: A Statement from the [place] as published in Radiology 2005; 237:395-400.   Electronically Signed  By: Camie Patience M.D.   On: 08/04/2013 09:57   US Abdomen Complete  07/28/2013   CLINICAL DATA:  Pain.  EXAM: ULTRASOUND ABDOMEN COMPLETE  COMPARISON:  None.  FINDINGS: Gallbladder:  Non mobile 5 mm echogenic focus on the posterior wall of gallbladder. This could represent tumefactive sludge, non shadowing gallstone, or polyp. Gallbladder wall thickness 1.9 mm. Negative sonographic Murphy sign.  Common bile duct:  Diameter: 4.3 mm.  Liver:  A  11.3 x 10.2 x 10.6 cm complex septated cyst right lobe of the liver. There is adjacent 3.8 x 3.5 x 3.8 cm complex septated cyst right lobe of the liver. CT of the abdomen is suggested for further evaluation.  IVC:  No abnormality visualized.  Pancreas:  Visualized portion unremarkable.  Spleen:  Size and appearance within normal limits.  Right Kidney:  Length: 10.6 cm. Echogenicity within normal limits. No mass or hydronephrosis visualized.  Left Kidney:  Length: 11.4 cm. Echogenicity within normal limits. No mass or hydronephrosis visualized.  Abdominal aorta:  No aneurysm visualized.  Other findings:  None.  IMPRESSION: 1. Two large complex septated cysts right lobe of the liver for which CT of the abdomen is suggested for further evaluation. 2. 5 mm echogenic non mobile focus along posterior gallbladder wall . Differential diagnosis includes tumefactive sludge, non shadowing gallstone, or polyp.   Electronically Signed   By: Marcello Moores  Register   On: 07/28/2013 13:29      ASSESSMENT AND PLAN: Liver cyst Pt has symptomatic large right sided liver cyst.   Will plan laparoscopic liver cyst unroofing and cholecystectomy.   I discussed the proximity of the portal vein and hepatic vein branches to the cysts.    The gallbladder will need to go for surgical purposes, but she also has gallbladder polyp and post prandial pain.    We reviewed the risks of bleeding, infection, recurrent cyst, bile leak, fatigue, pain, and other cardiopulmonary complications.    I described the incisions.  She is eager to get this done since she has significant discomfort.     Gallbladder polyp Pt will get cholecystectomy.  The surgical procedure was described to the patient in detail.  The patient was given Neurosurgeon. .  I discussed the incision type and location, the location of the gallbladder, the anatomy of the bile ducts and arteries, and the typical progression of surgery.  I discussed the possibility of  converting to an open operation.  I advised of the risks of bleeding, infection, damage to other structures (such as the bile duct, intestine or liver), bile leak, need for other procedures or surgeries, and post op diarrhea/constipation.  We discussed the risk of blood clot.  We discussed the recovery period and post operative restrictions.  The patient was advised against taking blood thinners the week before surgery.          Milus Height MD Surgical Oncology, General and [facility]      Visit Diagnoses: 1. Liver cyst   2. Gallbladder polyp     Primary Care Physician: [name], MD

## 2013-08-28 NOTE — Patient Instructions (Addendum)
[place]  09/01/2013   Your procedure is scheduled on:4-16   -2015  Report to [facility] at      [place]  AM .  Call this number if you have problems the morning of surgery: [phone]  Or Presurgical Testing [phone](Lorraine Woods) For Living Will and/or Health Care Power Attorney Forms: please provide copy for your medical record,may bring AM of surgery(Forms should be already notarized -we do not provide this service).(09-01-13 Pt. Has both forms-will provide if absolutely needed -safe deposit box-prefer not to provide at this time.)     Do not eat food:After Midnight.    Take these medicines the morning of surgery with A SIP OF WATER: Sertraline(Zoloft).   Do not wear jewelry, make-up or nail polish.  Do not wear lotions, powders, or perfumes. You may wear deodorant.  Do not shave 48 hours(2 days) prior to first CHG shower(legs and under arms).(Shaving face and neck okay.)  Do not bring valuables to the hospital.(Hospital is not responsible for lost valuables).  Contacts, dentures or removable bridgework, body piercing, hair pins may not be worn into surgery.  Leave suitcase in the car. After surgery it may be brought to your room.  For patients admitted to the hospital, checkout time is 11:00 AM the day of discharge.(Restricted visitors-Any Persons displaying flu-like symptoms or illness).    Patients discharged the day of surgery will not be allowed to drive home. Must have responsible person with you x 24 hours once discharged.  Name and phone number of your driver: Lorraine Woods, spouse [phone] h Special Instructions: CHG(Chlorhedine 4%-"Hibiclens","Betasept","Aplicare") Shower Use Special Wash: see special instructions.(avoid face and genitals)     Failure to follow these instructions may result in Cancellation of your surgery.   Patient signature_______________________________________________________

## 2013-09-01 ENCOUNTER — Encounter (HOSPITAL_COMMUNITY)
Admission: RE | Admit: 2013-09-01 | Discharge: 2013-09-01 | Disposition: A | Discharge status: Home / Self Care | Payer: Commercial Managed Care - PPO | Source: Ambulatory Visit | Attending: General Surgery | Admitting: General Surgery

## 2013-09-01 ENCOUNTER — Encounter (HOSPITAL_COMMUNITY): Payer: Self-pay | Admitting: Pharmacy Technician

## 2013-09-01 ENCOUNTER — Encounter (HOSPITAL_COMMUNITY): Payer: Self-pay

## 2013-09-01 DIAGNOSIS — Z01812 Encounter for preprocedural laboratory examination: Secondary | ICD-10-CM | POA: Insufficient documentation

## 2013-09-01 LAB — COMPREHENSIVE METABOLIC PANEL
ALT: 16 U/L (ref 0–35)
AST: 19 U/L (ref 0–37)
Albumin: 3.9 g/dL (ref 3.5–5.2)
Alkaline Phosphatase: 48 U/L (ref 39–117)
BILIRUBIN TOTAL: 0.5 mg/dL (ref 0.3–1.2)
BUN: 21 mg/dL (ref 6–23)
CALCIUM: 9.6 mg/dL (ref 8.4–10.5)
CHLORIDE: 104 meq/L (ref 96–112)
CO2: 27 mEq/L (ref 19–32)
Creatinine, Ser: 0.67 mg/dL (ref 0.50–1.10)
GFR calc Af Amer: >90 mL/min (ref >90)
GFR, EST NON AFRICAN AMERICAN: 89 mL/min — AB (ref >90)
Glucose, Bld: 95 mg/dL (ref 70–99)
Potassium: 4.1 mEq/L (ref 3.7–5.3)
SODIUM: 142 meq/L (ref 137–147)
Total Protein: 7.3 g/dL (ref 6.0–8.3)

## 2013-09-01 LAB — CBC WITH DIFFERENTIAL/PLATELET
BASOS ABS: 0 10*3/uL (ref 0.0–0.1)
Basophils Relative: 1 % (ref 0–1)
Eosinophils Absolute: 0.2 10*3/uL (ref 0.0–0.7)
Eosinophils Relative: 3 % (ref 0–5)
HCT: 42 % (ref 36.0–46.0)
Hemoglobin: 14 g/dL (ref 12.0–15.0)
LYMPHS PCT: 28 % (ref 12–46)
Lymphs Abs: 1.5 10*3/uL (ref 0.7–4.0)
MCH: 29.1 pg (ref 26.0–34.0)
MCHC: 33.3 g/dL (ref 30.0–36.0)
MCV: 87.3 fL (ref 78.0–100.0)
Monocytes Absolute: 0.5 10*3/uL (ref 0.1–1.0)
Monocytes Relative: 10 % (ref 3–12)
NEUTROS ABS: 3.1 10*3/uL (ref 1.7–7.7)
Neutrophils Relative %: 58 % (ref 43–77)
PLATELETS: 222 10*3/uL (ref 150–400)
RBC: 4.81 MIL/uL (ref 3.87–5.11)
RDW: 12.5 % (ref 11.5–15.5)
WBC: 5.4 10*3/uL (ref 4.0–10.5)

## 2013-09-01 NOTE — Pre-Procedure Instructions (Signed)
09-01-13 CT abd, U/S - 2'15 Epic.

## 2013-09-16 NOTE — Anesthesia Preprocedure Evaluation (Addendum)
Anesthesia Evaluation  Patient identified by MRN, date of birth, ID band Patient awake    Reviewed: Allergy & Precautions, H&P , NPO status , Patient's Chart, lab work & pertinent test results  Airway Mallampati: II  TM Distance: >3 FB Neck ROM: Full    Dental no notable dental hx.    Pulmonary neg pulmonary ROS,  breath sounds clear to auscultation  Pulmonary exam normal       Cardiovascular negative cardio ROS  Rhythm:Regular Rate:Normal     Neuro/Psych negative neurological ROS  negative psych ROS   GI/Hepatic negative GI ROS, Neg liver ROS,   Endo/Other  negative endocrine ROS  Renal/GU negative Renal ROS  negative genitourinary   Musculoskeletal negative musculoskeletal ROS (+)   Abdominal   Peds negative pediatric ROS (+)  Hematology negative hematology ROS (+)   Anesthesia Other Findings   Reproductive/Obstetrics negative OB ROS                            Anesthesia Physical Anesthesia Plan  ASA: II  Anesthesia Plan: General   Post-op Pain Management:    Induction: Intravenous  Airway Management Planned: Oral ETT  Additional Equipment:   Intra-op Plan:   Post-operative Plan: Extubation in OR  Informed Consent: I have reviewed the patients History and Physical, chart, labs and discussed the procedure including the risks, benefits and alternatives for the proposed anesthesia with the patient or authorized representative who has indicated his/her understanding and acceptance.   Dental advisory given  Plan Discussed with: CRNA and Surgeon  Anesthesia Plan Comments:         Anesthesia Quick Evaluation  

## 2013-09-17 ENCOUNTER — Encounter (HOSPITAL_COMMUNITY): Payer: Commercial Managed Care - PPO | Admitting: Anesthesiology

## 2013-09-17 ENCOUNTER — Ambulatory Visit (HOSPITAL_COMMUNITY): Payer: Commercial Managed Care - PPO

## 2013-09-17 ENCOUNTER — Ambulatory Visit (HOSPITAL_COMMUNITY)
Admission: RE | Admit: 2013-09-17 | Discharge: 2013-09-18 | Disposition: A | Discharge status: Home / Self Care | Payer: Commercial Managed Care - PPO | Source: Ambulatory Visit | Attending: General Surgery | Admitting: General Surgery

## 2013-09-17 ENCOUNTER — Encounter (HOSPITAL_COMMUNITY)
Admission: RE | Disposition: A | Discharge status: Home / Self Care | Payer: Self-pay | Source: Ambulatory Visit | Attending: General Surgery

## 2013-09-17 ENCOUNTER — Ambulatory Visit (HOSPITAL_COMMUNITY): Payer: Commercial Managed Care - PPO | Admitting: Anesthesiology

## 2013-09-17 ENCOUNTER — Encounter (HOSPITAL_COMMUNITY): Payer: Self-pay | Admitting: *Deleted

## 2013-09-17 DIAGNOSIS — Z85828 Personal history of other malignant neoplasm of skin: Secondary | ICD-10-CM | POA: Insufficient documentation

## 2013-09-17 DIAGNOSIS — K7689 Other specified diseases of liver: Secondary | ICD-10-CM

## 2013-09-17 DIAGNOSIS — K824 Cholesterolosis of gallbladder: Secondary | ICD-10-CM | POA: Insufficient documentation

## 2013-09-17 DIAGNOSIS — K811 Chronic cholecystitis: Secondary | ICD-10-CM | POA: Insufficient documentation

## 2013-09-17 DIAGNOSIS — Z79899 Other long term (current) drug therapy: Secondary | ICD-10-CM | POA: Insufficient documentation

## 2013-09-17 DIAGNOSIS — Z9089 Acquired absence of other organs: Secondary | ICD-10-CM | POA: Insufficient documentation

## 2013-09-17 HISTORY — PX: LAPAROSCOPIC LIVER CYST UNROOFING: SHX5901

## 2013-09-17 HISTORY — PX: CHOLECYSTECTOMY: SHX55

## 2013-09-17 SURGERY — UNROOFING, CYST, LIVER, LAPAROSCOPIC
Anesthesia: General | Site: Abdomen

## 2013-09-17 MED ORDER — SUCCINYLCHOLINE CHLORIDE 20 MG/ML IJ SOLN
INTRAMUSCULAR | Status: DC | PRN
  Administered 2013-09-17: 100 mg via INTRAVENOUS

## 2013-09-17 MED ORDER — FENTANYL CITRATE 0.05 MG/ML IJ SOLN
INTRAMUSCULAR | Status: DC | PRN
  Administered 2013-09-17 (×5): 50 ug via INTRAVENOUS

## 2013-09-17 MED ORDER — CHLORHEXIDINE GLUCONATE 0.12 % MT SOLN
15.0000 mL | Freq: Two times a day (BID) | OROMUCOSAL | Status: DC
  Filled 2013-09-17 (×2): qty 15

## 2013-09-17 MED ORDER — ONDANSETRON HCL 4 MG/2ML IJ SOLN: 4.0000 mg | Freq: Four times a day (QID) | INTRAMUSCULAR | Status: DC | PRN

## 2013-09-17 MED ORDER — GLYCOPYRROLATE 0.2 MG/ML IJ SOLN
INTRAMUSCULAR | Status: DC | PRN
  Administered 2013-09-17: 0.6 mg via INTRAVENOUS
  Administered 2013-09-17: 0.2 mg via INTRAVENOUS

## 2013-09-17 MED ORDER — OXYCODONE-ACETAMINOPHEN 5-325 MG PO TABS: 1.0000 | ORAL_TABLET | ORAL | Status: DC | PRN

## 2013-09-17 MED ORDER — OXYCODONE-ACETAMINOPHEN 5-325 MG PO TABS
1.0000 | ORAL_TABLET | ORAL | Status: DC | PRN
  Filled 2013-09-17: qty 2

## 2013-09-17 MED ORDER — PHENYLEPHRINE 40 MCG/ML (10ML) SYRINGE FOR IV PUSH (FOR BLOOD PRESSURE SUPPORT)
PREFILLED_SYRINGE | INTRAVENOUS | Status: AC
  Filled 2013-09-17: qty 10

## 2013-09-17 MED ORDER — FENTANYL CITRATE 0.05 MG/ML IJ SOLN
INTRAMUSCULAR | Status: AC
  Filled 2013-09-17: qty 5

## 2013-09-17 MED ORDER — CEFAZOLIN SODIUM-DEXTROSE 2-3 GM-% IV SOLR
2.0000 g | INTRAVENOUS | Status: AC
Start: 2013-09-17 — End: 2013-09-17
  Administered 2013-09-17: 2 g via INTRAVENOUS

## 2013-09-17 MED ORDER — ONDANSETRON HCL 4 MG PO TABS: 4.0000 mg | ORAL_TABLET | Freq: Four times a day (QID) | ORAL | Status: DC | PRN

## 2013-09-17 MED ORDER — DEXAMETHASONE SODIUM PHOSPHATE 10 MG/ML IJ SOLN
INTRAMUSCULAR | Status: DC | PRN
  Administered 2013-09-17: 10 mg via INTRAVENOUS

## 2013-09-17 MED ORDER — CEFAZOLIN SODIUM-DEXTROSE 2-3 GM-% IV SOLR
INTRAVENOUS | Status: AC
  Filled 2013-09-17: qty 50

## 2013-09-17 MED ORDER — IOHEXOL 300 MG/ML  SOLN
INTRAMUSCULAR | Status: DC | PRN
  Administered 2013-09-17: 4 mL

## 2013-09-17 MED ORDER — PHENYLEPHRINE HCL 10 MG/ML IJ SOLN
INTRAMUSCULAR | Status: DC | PRN
  Administered 2013-09-17 (×3): 80 ug via INTRAVENOUS

## 2013-09-17 MED ORDER — LIDOCAINE HCL (CARDIAC) 20 MG/ML IV SOLN
INTRAVENOUS | Status: DC | PRN
  Administered 2013-09-17: 100 mg via INTRAVENOUS

## 2013-09-17 MED ORDER — MORPHINE SULFATE 2 MG/ML IJ SOLN
1.0000 mg | INTRAMUSCULAR | Status: DC | PRN
  Administered 2013-09-17 (×2): 2 mg via INTRAVENOUS
  Filled 2013-09-17 (×2): qty 1

## 2013-09-17 MED ORDER — BUPIVACAINE-EPINEPHRINE PF 0.25-1:200000 % IJ SOLN
INTRAMUSCULAR | Status: AC
Start: 2013-09-17 — End: 2013-09-17
  Filled 2013-09-17: qty 30

## 2013-09-17 MED ORDER — KCL IN DEXTROSE-NACL 20-5-0.45 MEQ/L-%-% IV SOLN
INTRAVENOUS | Status: DC
  Administered 2013-09-17: 100 mL/h via INTRAVENOUS
  Administered 2013-09-18: via INTRAVENOUS
  Filled 2013-09-17 (×5): qty 1000

## 2013-09-17 MED ORDER — OXYCODONE-ACETAMINOPHEN 5-325 MG PO TABS
1.0000 | ORAL_TABLET | ORAL | Status: DC | PRN
  Administered 2013-09-17 – 2013-09-18 (×2): 2 via ORAL
  Filled 2013-09-17: qty 2

## 2013-09-17 MED ORDER — ONDANSETRON HCL 4 MG/2ML IJ SOLN
INTRAMUSCULAR | Status: AC
  Filled 2013-09-17: qty 2

## 2013-09-17 MED ORDER — HYDROMORPHONE HCL PF 1 MG/ML IJ SOLN
INTRAMUSCULAR | Status: DC | PRN
  Administered 2013-09-17 (×2): 0.5 mg via INTRAVENOUS

## 2013-09-17 MED ORDER — BUPIVACAINE-EPINEPHRINE 0.25% -1:200000 IJ SOLN
INTRAMUSCULAR | Status: DC | PRN
  Administered 2013-09-17: 6.5 mL

## 2013-09-17 MED ORDER — SUMATRIPTAN SUCCINATE 100 MG PO TABS
100.0000 mg | ORAL_TABLET | ORAL | Status: DC | PRN
  Filled 2013-09-17: qty 1

## 2013-09-17 MED ORDER — MIDAZOLAM HCL 5 MG/5ML IJ SOLN
INTRAMUSCULAR | Status: DC | PRN
  Administered 2013-09-17: 2 mg via INTRAVENOUS

## 2013-09-17 MED ORDER — LIDOCAINE HCL (CARDIAC) 20 MG/ML IV SOLN
INTRAVENOUS | Status: AC
  Filled 2013-09-17: qty 5

## 2013-09-17 MED ORDER — LIDOCAINE HCL 1 % IJ SOLN
INTRAMUSCULAR | Status: DC | PRN
  Administered 2013-09-17: 6.5 mL

## 2013-09-17 MED ORDER — PROPOFOL 10 MG/ML IV BOLUS
INTRAVENOUS | Status: AC
  Filled 2013-09-17: qty 20

## 2013-09-17 MED ORDER — ONDANSETRON HCL 4 MG/2ML IJ SOLN
INTRAMUSCULAR | Status: DC | PRN
  Administered 2013-09-17: 4 mg via INTRAVENOUS

## 2013-09-17 MED ORDER — HYDROMORPHONE HCL PF 2 MG/ML IJ SOLN
INTRAMUSCULAR | Status: AC
  Filled 2013-09-17: qty 1

## 2013-09-17 MED ORDER — GLYCOPYRROLATE 0.2 MG/ML IJ SOLN
INTRAMUSCULAR | Status: AC
  Filled 2013-09-17: qty 3

## 2013-09-17 MED ORDER — LIDOCAINE HCL 1 % IJ SOLN
INTRAMUSCULAR | Status: AC
  Filled 2013-09-17: qty 20

## 2013-09-17 MED ORDER — NEOSTIGMINE METHYLSULFATE 1 MG/ML IJ SOLN
INTRAMUSCULAR | Status: AC
  Filled 2013-09-17: qty 10

## 2013-09-17 MED ORDER — PROMETHAZINE HCL 25 MG/ML IJ SOLN: 6.2500 mg | INTRAMUSCULAR | Status: DC | PRN

## 2013-09-17 MED ORDER — CHLORHEXIDINE GLUCONATE 4 % EX LIQD: 1.0000 "application " | Freq: Once | CUTANEOUS | Status: DC

## 2013-09-17 MED ORDER — LACTATED RINGERS IV SOLN
INTRAVENOUS | Status: DC | PRN
  Administered 2013-09-17 (×2): via INTRAVENOUS

## 2013-09-17 MED ORDER — SERTRALINE HCL 100 MG PO TABS
100.0000 mg | ORAL_TABLET | Freq: Every morning | ORAL | Status: DC
  Administered 2013-09-18: 100 mg via ORAL
  Filled 2013-09-17: qty 1

## 2013-09-17 MED ORDER — EPHEDRINE SULFATE 50 MG/ML IJ SOLN
INTRAMUSCULAR | Status: DC | PRN
  Administered 2013-09-17 (×2): 10 mg via INTRAVENOUS

## 2013-09-17 MED ORDER — CEFAZOLIN SODIUM 1-5 GM-% IV SOLN
1.0000 g | Freq: Four times a day (QID) | INTRAVENOUS | Status: AC
  Administered 2013-09-17 – 2013-09-18 (×3): 1 g via INTRAVENOUS
  Filled 2013-09-17 (×3): qty 50

## 2013-09-17 MED ORDER — BIOTENE DRY MOUTH MT LIQD: 15.0000 mL | OROMUCOSAL | Status: DC | PRN

## 2013-09-17 MED ORDER — PROPOFOL 10 MG/ML IV BOLUS
INTRAVENOUS | Status: DC | PRN
  Administered 2013-09-17: 140 mg via INTRAVENOUS

## 2013-09-17 MED ORDER — GLYCOPYRROLATE 0.2 MG/ML IJ SOLN
INTRAMUSCULAR | Status: AC
  Filled 2013-09-17: qty 1

## 2013-09-17 MED ORDER — ROCURONIUM BROMIDE 100 MG/10ML IV SOLN
INTRAVENOUS | Status: DC | PRN
  Administered 2013-09-17: 50 mg via INTRAVENOUS
  Administered 2013-09-17: 5 mg via INTRAVENOUS

## 2013-09-17 MED ORDER — DEXAMETHASONE SODIUM PHOSPHATE 10 MG/ML IJ SOLN
INTRAMUSCULAR | Status: AC
  Filled 2013-09-17: qty 1

## 2013-09-17 MED ORDER — EPHEDRINE SULFATE 50 MG/ML IJ SOLN
INTRAMUSCULAR | Status: AC
  Filled 2013-09-17: qty 1

## 2013-09-17 MED ORDER — ROCURONIUM BROMIDE 100 MG/10ML IV SOLN
INTRAVENOUS | Status: AC
  Filled 2013-09-17: qty 1

## 2013-09-17 MED ORDER — SODIUM CHLORIDE 0.9 % IJ SOLN
INTRAMUSCULAR | Status: AC
  Filled 2013-09-17: qty 10

## 2013-09-17 MED ORDER — NEOSTIGMINE METHYLSULFATE 1 MG/ML IJ SOLN
INTRAMUSCULAR | Status: DC | PRN
  Administered 2013-09-17: 5 mg via INTRAVENOUS

## 2013-09-17 MED ORDER — MIDAZOLAM HCL 2 MG/2ML IJ SOLN
INTRAMUSCULAR | Status: AC
  Filled 2013-09-17: qty 2

## 2013-09-17 MED ORDER — HYDROMORPHONE HCL PF 1 MG/ML IJ SOLN: 0.2500 mg | INTRAMUSCULAR | Status: DC | PRN

## 2013-09-17 SURGICAL SUPPLY — 49 items
APPLIER CLIP 5 13 M/L LIGAMAX5 (MISCELLANEOUS) ×2
APPLIER CLIP ROT 10 11.4 M/L (STAPLE) ×2
CANISTER SUCTION 2500CC (MISCELLANEOUS) IMPLANT
CHLORAPREP W/TINT 26ML (MISCELLANEOUS) ×2 IMPLANT
CLIP APPLIE 5 13 M/L LIGAMAX5 (MISCELLANEOUS) ×1 IMPLANT
CLIP APPLIE ROT 10 11.4 M/L (STAPLE) ×1 IMPLANT
CLIP LIGATING HEMO O LOK GREEN (MISCELLANEOUS) IMPLANT
COVER MAYO STAND STRL (DRAPES) ×2 IMPLANT
DECANTER SPIKE VIAL GLASS SM (MISCELLANEOUS) ×2 IMPLANT
DERMABOND ADVANCED (GAUZE/BANDAGES/DRESSINGS) ×1
DERMABOND ADVANCED .7 DNX12 (GAUZE/BANDAGES/DRESSINGS) ×1 IMPLANT
DRAIN CHANNEL 19F RND (DRAIN) IMPLANT
DRAPE C-ARM 42X120 X-RAY (DRAPES) IMPLANT
DRAPE LAPAROSCOPIC ABDOMINAL (DRAPES) ×2 IMPLANT
DRAPE UTILITY XL STRL (DRAPES) IMPLANT
DRESSING TELFA ISLAND 4X8 (GAUZE/BANDAGES/DRESSINGS) IMPLANT
DRSG TELFA 4X10 ISLAND STR (GAUZE/BANDAGES/DRESSINGS) IMPLANT
DRSG TELFA PLUS 4X6 ADH ISLAND (GAUZE/BANDAGES/DRESSINGS) IMPLANT
ELECT REM PT RETURN 9FT ADLT (ELECTROSURGICAL) ×2
ELECTRODE REM PT RTRN 9FT ADLT (ELECTROSURGICAL) ×1 IMPLANT
EVACUATOR SILICONE 100CC (DRAIN) IMPLANT
GLOVE BIO SURGEON STRL SZ 6 (GLOVE) ×4 IMPLANT
GLOVE INDICATOR 6.5 STRL GRN (GLOVE) ×4 IMPLANT
GOWN SPEC L3 XXLG W/TWL (GOWN DISPOSABLE) ×6 IMPLANT
GOWN STRL REUS W/TWL XL LVL3 (GOWN DISPOSABLE) ×4 IMPLANT
KIT BASIN OR (CUSTOM PROCEDURE TRAY) ×2 IMPLANT
POUCH SPECIMEN RETRIEVAL 10MM (ENDOMECHANICALS) ×4 IMPLANT
SCALPEL HARMONIC ACE (MISCELLANEOUS) IMPLANT
SCISSORS ENDO CVD 5DCS (MISCELLANEOUS) ×2 IMPLANT
SET CHOLANGIOGRAPH MIX (MISCELLANEOUS) ×2 IMPLANT
SET IRRIG TUBING LAPAROSCOPIC (IRRIGATION / IRRIGATOR) ×2 IMPLANT
SLEEVE XCEL OPT CAN 5 100 (ENDOMECHANICALS) ×4 IMPLANT
SOLUTION ANTI FOG 6CC (MISCELLANEOUS) ×2 IMPLANT
SPONGE DRAIN TRACH 4X4 STRL 2S (GAUZE/BANDAGES/DRESSINGS) IMPLANT
SPONGE LAP 18X18 X RAY DECT (DISPOSABLE) IMPLANT
STAPLER VISISTAT 35W (STAPLE) IMPLANT
STRIP CLOSURE SKIN 1/2X4 (GAUZE/BANDAGES/DRESSINGS) IMPLANT
SUT MNCRL AB 4-0 PS2 18 (SUTURE) ×2 IMPLANT
SUT PROLENE 5 0 CC 1 (SUTURE) IMPLANT
TOWEL OR 17X26 10 PK STRL BLUE (TOWEL DISPOSABLE) ×2 IMPLANT
TOWEL OR NON WOVEN STRL DISP B (DISPOSABLE) ×2 IMPLANT
TRAP SPECIMEN MUCOUS 40CC (MISCELLANEOUS) ×2 IMPLANT
TRAY LAP CHOLE (CUSTOM PROCEDURE TRAY) ×2 IMPLANT
TROCAR BLADELESS OPT 5 100 (ENDOMECHANICALS) ×2 IMPLANT
TROCAR BLADELESS OPT 5 75 (ENDOMECHANICALS) IMPLANT
TROCAR SLEEVE XCEL 5X75 (ENDOMECHANICALS) ×4 IMPLANT
TROCAR XCEL BLUNT TIP 100MML (ENDOMECHANICALS) ×2 IMPLANT
TROCAR XCEL NON-BLD 11X100MML (ENDOMECHANICALS) IMPLANT
TUBING INSUFFLATION 10FT LAP (TUBING) ×2 IMPLANT

## 2013-09-17 NOTE — Anesthesia Postprocedure Evaluation (Signed)
  Anesthesia Post-op Note  Patient: Lorraine Woods  Procedure(s) Performed: Procedure(s) (LRB): LAPAROSCOPIC LIVER CYST UNROOFING (N/A) LAPAROSCOPIC CHOLECYSTECTOMY (N/A)  Patient Location: PACU  Anesthesia Type: General  Level of Consciousness: awake and alert   Airway and Oxygen Therapy: Patient Spontanous Breathing  Post-op Pain: mild  Post-op Assessment: Post-op Vital signs reviewed, Patient's Cardiovascular Status Stable, Respiratory Function Stable, Patent Airway and No signs of Nausea or vomiting  Last Vitals:  Filed Vitals:   09/17/13 1015  BP: 146/70  Pulse: 71  Temp: 36.5 C  Resp: 15    Post-op Vital Signs: stable   Complications: No apparent anesthesia complications

## 2013-09-17 NOTE — Interval H&P Note (Signed)
History and Physical Interval Note:  09/17/2013 7:44 AM  Lorraine Woods  has presented today for surgery, with the diagnosis of giant liver cyst and gall bladder polyp   The various methods of treatment have been discussed with the patient and family. After consideration of risks, benefits and other options for treatment, the patient has consented to  Procedure(s): LAPAROSCOPIC LIVER CYST UNROOFING (N/A) LAPAROSCOPIC CHOLECYSTECTOMY (N/A) as a surgical intervention .  The patient's history has been reviewed, patient examined, no change in status, stable for surgery.  I have reviewed the patient's chart and labs.  Questions were answered to the patient's satisfaction.     Stark Klein

## 2013-09-17 NOTE — H&P (View-Only) (Signed)
Chief Complaint  Patient presents with  . New Evaluation    liver cyst Right side    HISTORY: Patient is a 68 year old female who has been experiencing around 4 weeks of severe right upper quadrant pain and fullness. She describes this as very achy discomfort. She has also started to have a cough and back pain. When she takes a deep breath it hurts worse. It also hurts worse after eating. She has experienced some early satiety as well, but no weight loss.  Dr. Olevia Perches of gastroenterology saw her and found a very large liver cyst when she performed an ultrasound. The patient subsequently had a CT. She has an extremely large dominant right hepatic cyst that does appear to stretch the capsule significantly. There is adjacent cyst that appears to communicate with it. These both appear very bland and banal.  There is another smaller cyst in the parenchyma of the liver that does not appear to communicate with the other cysts. There are several other small cysts in the left lobe. She denies fevers and chills. She has no symptoms of heart failure.  Past Medical History  Diagnosis Date  . Anxiety   . Migraine   . Knee pain, chronic   . Wears glasses   . Liver cyst   . Basal cell carcinoma     nose    Past Surgical History  Procedure Laterality Date  . Appendectomy  1959  . Tubal ligation  1982  . Skin cancer excision    . Upper endoscopy w/ sclerotherapy    . Ankle fracture surgery Left   . Vein surgery Left   . Breast lumpectomy Right     on HRT, fibromadenoma  . Orif ankle fracture  2000    left  . Breast excisional biopsy  2002    noncancerous lump  . Skin cancer excision      basil cell lerion on nose    Current Outpatient Prescriptions  Medication Sig Dispense Refill  . BIOTIN PO Take 1,000 mcg by mouth daily.      . Cholecalciferol (VITAMIN D3) 2000 UNITS TABS Take by mouth daily.      . minoxidil (ROGAINE) 2 % external solution Apply topically 2 (two) times daily.      .  Multiple Vitamin (MULTIVITAMIN) capsule Take 1 capsule by mouth daily.      . sertraline (ZOLOFT) 100 MG tablet TAKE ONE TABLET BY MOUTH DAILY  90 tablet  1  . SUMAtriptan (IMITREX) 100 MG tablet Take 1 tablet (100 mg total) by mouth every 2 (two) hours as needed for migraine. May repeat in 2 hours if headache persists or recurs.  9 tablet  3   No current facility-administered medications for this visit.     Allergies  Allergen Reactions  . Excedrin Migraine [Aspirin-Acetaminophen-Caffeine] Other (See Comments)    Bruising      Family History  Problem Relation Age of Onset  . Brain cancer Mother 64  . Diabetes Father   . Hypertension Father   . CAD Father   . Heart disease Father   . Colon polyps Son     Puetz-jehgers syndrome     History   Social History  . Marital Status: Married    Spouse Name: John     Number of Children: 2  . Years of Education: N/A   Occupational History  . Echo sonographer     [place] History Main Topics  . Smoking  status: Never Smoker   . Smokeless tobacco: Never Used  . Alcohol Use: Yes     Comment: occasional  . Drug Use: No  . Sexual Activity: None   Other Topics Concern  . None   Social History Narrative  . None     REVIEW OF SYSTEMS - PERTINENT POSITIVES ONLY: 12 point review of systems negative other than HPI and PMH except for headaches, easy bruising, and RUQ abdominal pain.    EXAM: Filed Vitals:   08/17/13 1408  BP: 118/68  Pulse: 80  Temp: 100 F (37.8 C)  Resp: 14    Wt Readings from Last 3 Encounters:  08/17/13 191 lb 12.8 oz (87 kg)  08/07/13 192 lb 6 oz (87.261 kg)  04/07/13 192 lb (87.091 kg)     Gen:  No acute distress.  Well nourished and well groomed.   Neurological: Alert and oriented to person, place, and time. Coordination normal.  Head: Normocephalic and atraumatic.  Eyes: Conjunctivae are normal. Pupils are equal, round, and reactive to light. No scleral icterus.   Neck: Normal range of motion. Neck supple. No tracheal deviation or thyromegaly present.  Cardiovascular: Normal rate, regular rhythm, normal heart sounds and intact distal pulses.  Exam reveals no gallop and no friction rub.  No murmur heard. Respiratory: Effort normal.  No respiratory distress. No chest wall tenderness. Breath sounds normal.  No wheezes, rales or rhonchi.  GI: Soft. Bowel sounds are normal. The abdomen is soft and nondistended.  There is some RUQ tenderness.  There is no rebound and no guarding.  Musculoskeletal: Normal range of motion. Extremities are nontender.  Lymphadenopathy: No cervical, preauricular, postauricular or axillary adenopathy is present Skin: Skin is warm and dry. No rash noted. No diaphoresis. No erythema. No pallor. No clubbing, cyanosis, or edema.   Psychiatric: Normal mood and affect. Behavior is normal. Judgment and thought content normal.    LABORATORY RESULTS: Available labs are reviewed   Recent Results (from the past 2160 hour(s))  BASIC METABOLIC PANEL WITH GFR     Status: None   Collection Time    07/31/13 12:01 AM      Result Value Ref Range   Sodium 140  135 - 145 mEq/L   Potassium 4.1  3.5 - 5.3 mEq/L   Chloride 104  96 - 112 mEq/L   CO2 29  19 - 32 mEq/L   Glucose, Bld 93  70 - 99 mg/dL   BUN 17  6 - 23 mg/dL   Creat 0.75  0.50 - 1.10 mg/dL   Calcium 9.4  8.4 - 10.5 mg/dL   GFR, Est African American >89     GFR, Est Non African American 83     Comment:       The estimated GFR is a calculation valid for adults (>=30 years old)     that uses the CKD-EPI algorithm to adjust for age and sex. It is       not to be used for children, pregnant women, hospitalized patients,        patients on dialysis, or with rapidly changing kidney function.     According to the NKDEP, eGFR >89 is normal, 60-89 shows mild     impairment, 30-59 shows moderate impairment, 15-29 shows severe     impairment and <15 is ESRD.        HEPATIC FUNCTION PANEL      Status: Abnormal   Collection Time    08/07/13  2:59  PM      Result Value Ref Range   Total Bilirubin 0.8  0.3 - 1.2 mg/dL   Bilirubin, Direct 0.2  0.0 - 0.3 mg/dL   Alkaline Phosphatase 38 (*) 39 - 117 U/L   AST 24  0 - 37 U/L   ALT 19  0 - 35 U/L   Total Protein 7.3  6.0 - 8.3 g/dL   Albumin 4.3  3.5 - 5.2 g/dL  AMYLASE     Status: None   Collection Time    08/07/13  2:59 PM      Result Value Ref Range   Amylase 66  27 - 131 U/L  LIPASE     Status: None   Collection Time    08/07/13  2:59 PM      Result Value Ref Range   Lipase 34.0  11.0 - 59.0 U/L     RADIOLOGY RESULTS: See E-Chart or I-Site for most recent results.  Images and reports are reviewed.  Ct Abdomen W Contrast  08/04/2013   CLINICAL DATA:  Right upper quadrant abdominal pain. Follow-up hepatic cysts demonstrated on ultrasound. History of nasal basal cell carcinoma.  EXAM: CT ABDOMEN WITH CONTRAST  TECHNIQUE: Multidetector CT imaging of the abdomen was performed using the standard protocol following bolus administration of intravenous contrast.  CONTRAST:  OMNIPAQUE IOHEXOL 300 MG/ML  SOLN  COMPARISON:  US ABDOMEN COMPLETE dated 07/28/2013  FINDINGS: There is a 7 mm left lower lobe nodule on image 7. This appears somewhat tubular and could be vascular; its evaluation is mildly limited by motion. No other basilar pulmonary nodules are identified. There is mild dependent atelectasis or scarring in both lung bases. There is no significant pleural or pericardial effusion.  Multiple hepatic cysts are demonstrated. The dominant lesion is in the dome of the right hepatic lobe, measuring 11.2 by 12.9 cm on image 24. There is an immediately adjacent mildly septated cystic lesion measuring 7.6 x 4.7 cm on image 30. There is a 3.8 cm cystic lesion inferiorly in the right hepatic lobe on image 39. Multiple other smaller lesions are identified. No enhancing liver lesions, thickened septations or associated calcifications are  evident.  There is a possible small gallbladder polyp on image 49, corresponding with the ultrasound finding. The gallbladder otherwise appears normal. There is no biliary dilatation. There are no suspicious pancreatic findings. There is invagination of retroperitoneal fat between the pancreatic body and the duodenum on axial images 46 through 48. There is no pancreatic ductal dilatation. The spleen appears normal.  Both adrenal glands and kidneys appear normal. There is no hydronephrosis.  The stomach and visualized portions of the small bowel and colon appear normal. No inflammatory changes or enlarged lymph nodes are demonstrated.  There are degenerative changes within the lower lumbar spine. No worrisome osseous findings are demonstrated.  The pelvis was not imaged.  IMPRESSION: 1. There are multiple hepatic cysts, some of which demonstrate thin septations. No aggressive features are demonstrated. 2. Possible small gallbladder polyp as correlated with prior ultrasound. 3. Left lower lobe pulmonary nodule versus small AVM. If the patient is at high risk for bronchogenic carcinoma, follow-up chest CT at 3-49months is recommended. If the patient is at low risk for bronchogenic carcinoma, follow-up chest CT at 6-12 months is recommended. This recommendation follows the consensus statement: Guidelines for Management of Small Pulmonary Nodules Detected on CT Scans: A Statement from the Fleischner Society as published in Radiology 2005; 237:395-400.   Electronically Signed  By: Camie Patience M.D.   On: 08/04/2013 09:57   US Abdomen Complete  07/28/2013   CLINICAL DATA:  Pain.  EXAM: ULTRASOUND ABDOMEN COMPLETE  COMPARISON:  None.  FINDINGS: Gallbladder:  Non mobile 5 mm echogenic focus on the posterior wall of gallbladder. This could represent tumefactive sludge, non shadowing gallstone, or polyp. Gallbladder wall thickness 1.9 mm. Negative sonographic Murphy sign.  Common bile duct:  Diameter: 4.3 mm.  Liver:  A  11.3 x 10.2 x 10.6 cm complex septated cyst right lobe of the liver. There is adjacent 3.8 x 3.5 x 3.8 cm complex septated cyst right lobe of the liver. CT of the abdomen is suggested for further evaluation.  IVC:  No abnormality visualized.  Pancreas:  Visualized portion unremarkable.  Spleen:  Size and appearance within normal limits.  Right Kidney:  Length: 10.6 cm. Echogenicity within normal limits. No mass or hydronephrosis visualized.  Left Kidney:  Length: 11.4 cm. Echogenicity within normal limits. No mass or hydronephrosis visualized.  Abdominal aorta:  No aneurysm visualized.  Other findings:  None.  IMPRESSION: 1. Two large complex septated cysts right lobe of the liver for which CT of the abdomen is suggested for further evaluation. 2. 5 mm echogenic non mobile focus along posterior gallbladder wall . Differential diagnosis includes tumefactive sludge, non shadowing gallstone, or polyp.   Electronically Signed   By: Marcello Moores  Register   On: 07/28/2013 13:29      ASSESSMENT AND PLAN: Liver cyst Pt has symptomatic large right sided liver cyst.   Will plan laparoscopic liver cyst unroofing and cholecystectomy.   I discussed the proximity of the portal vein and hepatic vein branches to the cysts.    The gallbladder will need to go for surgical purposes, but she also has gallbladder polyp and post prandial pain.    We reviewed the risks of bleeding, infection, recurrent cyst, bile leak, fatigue, pain, and other cardiopulmonary complications.    I described the incisions.  She is eager to get this done since she has significant discomfort.     Gallbladder polyp Pt will get cholecystectomy.  The surgical procedure was described to the patient in detail.  The patient was given Neurosurgeon. .  I discussed the incision type and location, the location of the gallbladder, the anatomy of the bile ducts and arteries, and the typical progression of surgery.  I discussed the possibility of  converting to an open operation.  I advised of the risks of bleeding, infection, damage to other structures (such as the bile duct, intestine or liver), bile leak, need for other procedures or surgeries, and post op diarrhea/constipation.  We discussed the risk of blood clot.  We discussed the recovery period and post operative restrictions.  The patient was advised against taking blood thinners the week before surgery.          Milus Height MD Surgical Oncology, General and [facility]      Visit Diagnoses: 1. Liver cyst   2. Gallbladder polyp     Primary Care Physician: [name], MD

## 2013-09-17 NOTE — Transfer of Care (Signed)
Immediate Anesthesia Transfer of Care Note  Patient: Lorraine Woods  Procedure(s) Performed: Procedure(s) (LRB): LAPAROSCOPIC LIVER CYST UNROOFING (N/A) LAPAROSCOPIC CHOLECYSTECTOMY (N/A)  Patient Location: PACU  Anesthesia Type: General  Level of Consciousness: sedated, patient cooperative and responds to stimulation  Airway & Oxygen Therapy: Patient Spontanous Breathing and Patient connected to face mask oxgen  Post-op Assessment: Report given to PACU RN and Post -op Vital signs reviewed and stable  Post vital signs: Reviewed and stable  Complications: No apparent anesthesia complications

## 2013-09-17 NOTE — Discharge Instructions (Addendum)
[facility], [place] [phone]  ABDOMINAL SURGERY: POST OP INSTRUCTIONS  Always review your discharge instruction sheet given to you by the facility where your surgery was performed.  IF YOU HAVE DISABILITY OR FAMILY LEAVE FORMS, YOU MUST BRING THEM TO THE OFFICE FOR PROCESSING.  PLEASE DO NOT GIVE THEM TO YOUR DOCTOR.  1. A prescription for pain medication may be given to you upon discharge.  Take your pain medication as prescribed, if needed.  If narcotic pain medicine is not needed, then you may take acetaminophen (Tylenol) or ibuprofen (Advil) as needed. 2. Take your usually prescribed medications unless otherwise directed. 3. If you need a refill on your pain medication, please contact your pharmacy. They will contact our office to request authorization.  Prescriptions will not be filled after 5pm or on week-ends. 4. You should follow a light diet the first few days after arrival home, such as soup and crackers, pudding, etc.unless your doctor has advised otherwise. A high-fiber, low fat diet can be resumed as tolerated.   Be sure to include lots of fluids daily. Most patients will experience some swelling and bruising on the chest and neck area.  Ice packs will help.  Swelling and bruising can take several days to resolve 5. Most patients will experience some swelling and bruising in the area of the incision. Ice pack will help. Swelling and bruising can take several days to resolve..  6. It is common to experience some constipation if taking pain medication after surgery.  Increasing fluid intake and taking a stool softener will usually help or prevent this problem from occurring.  A mild laxative (Milk of Magnesia or Miralax) should be taken according to package directions if there are no bowel movements after 48 hours. 7.  You may have steri-strips (small skin tapes) in place directly over the incision.  These strips should be left on the skin for 10-14 days.  If your  surgeon used skin glue on the incision, you may shower in 48 hours.  The glue will flake off over the next 2-3 weeks.  Any sutures or staples will be removed at the office during your follow-up visit. You may find that a light gauze bandage over your incision may keep your staples from being rubbed or pulled. You may shower and replace the bandage daily. 8. ACTIVITIES:  You may resume regular (light) daily activities beginning the next day--such as daily self-care, walking, climbing stairs--gradually increasing activities as tolerated.  You may have sexual intercourse when it is comfortable.  Refrain from any heavy lifting or straining until approved by your doctor. a. You may drive when you no longer are taking prescription pain medication, you can comfortably wear a seatbelt, and you can safely maneuver your car and apply brakes b. Return to Work: __________3-7 weeks if applicable_________________________ 9. You should see your doctor in the office for a follow-up appointment approximately two weeks after your surgery.  Make sure that you call for this appointment within a day or two after you arrive home to insure a convenient appointment time. OTHER INSTRUCTIONS:  _____________________________________________________________ _____________________________________________________________  WHEN TO CALL YOUR DOCTOR: 1. Fever over 101.0 2. Inability to urinate 3. Nausea and/or vomiting 4. Extreme swelling or bruising 5. Continued bleeding from incision. 6. Increased pain, redness, or drainage from the incision. 7. Difficulty swallowing or breathing 8. Muscle cramping or spasms. 9. Numbness or tingling in hands or feet or around lips.  The clinic staff is  available to answer your questions during regular business hours.  Please don’t hesitate to call and ask to speak to one of the nurses if you have concerns. ° °For further questions, please visit [email or web address] ° ° ° °

## 2013-09-17 NOTE — Op Note (Signed)
Laparoscopic liver cyst unroofing and Laparoscopic Cholecystectomy with IOC Procedure Note  Indications: This patient presents with right upper quadrant abdominal pain, liver cyst, and gallbladder sludge vs polyp and will undergo lap liver cyst unroofing and laparoscopic cholecystectomy.  Pre-operative Diagnosis: see above  Post-operative Diagnosis: Same  Surgeon: Stark Klein   Assistants: none  Anesthesia: General endotracheal anesthesia and local  ASA Class: 2  Procedure Details  The patient was seen again in the Holding Room. The risks, benefits, complications, treatment options, and expected outcomes were discussed with the patient. The possibilities of  bleeding, recurrent infection, damage to nearby structures, the need for additional procedures, failure to diagnose a condition, the possible need to convert to an open procedure, and creating a complication requiring transfusion or operation were discussed with the patient. The likelihood of improving the patient's symptoms with return to their baseline status is good.    The patient and/or family concurred with the proposed plan, giving informed consent. The site of surgery properly noted. The patient was taken to Operating Room, and the procedure verified as Laparoscopic Cholecystectomy with Intraoperative Cholangiogram. A Time Out was held and the above information confirmed.  Prior to the induction of general anesthesia, antibiotic prophylaxis was administered. General endotracheal anesthesia was then administered and tolerated well. After the induction, the abdomen was prepped with Chloraprep and draped in the sterile fashion. The patient was positioned in the supine position.  Local anesthetic agent was injected into the skin near the umbilicus and an incision made. We dissected down to the abdominal fascia with blunt dissection.  The fascia was incised vertically and we entered the peritoneal cavity bluntly.  A pursestring suture  of 0-Vicryl was placed around the fascial opening.  The Hasson cannula was inserted and secured with the stay suture.  Pneumoperitoneum was then created with CO2 and tolerated well without any adverse changes in the patient's vital signs. An 11-mm port was placed in the subxiphoid position.  Two 5-mm ports were placed in the right upper quadrant. All skin incisions were infiltrated with a local anesthetic agent before making the incision and placing the trocars.   We positioned the patient in reverse Trendelenburg, tilted slightly to the patient's left.  The gallbladder was identified, the fundus grasped and retracted cephalad. Adhesions were lysed bluntly and with the electrocautery where indicated, taking care not to injure any adjacent organs or viscus. The infundibulum was grasped and retracted laterally, exposing the peritoneum overlying the triangle of Calot. This was then divided and exposed in a blunt fashion. A critical view of the cystic duct and cystic artery was obtained.  The cystic duct was clearly identified and bluntly dissected circumferentially. The cystic duct was ligated with a clip distally.   An incision was made in the cystic duct and the [facility] cholangiogram catheter introduced. The catheter was secured using a clip. A cholangiogram was then performed, demonstrating sl dilated CBD, but no filling defect, and filling into left and right hepatic duct.  .  The cystic duct was then ligated with clips and divided. The cystic artery was identified, dissected free, ligated with clips and divided as well.   The gallbladder was dissected from the liver bed in retrograde fashion with the electrocautery. The gallbladder was removed and placed in an Endocatch bag.  The gallbladder and Endocatch bag were then removed through the umbilical port site.  The liver bed was irrigated and inspected. Hemostasis was achieved with the electrocautery. Copious irrigation was utilized and was repeatedly aspirated  until clear.    The liver cyst was then addressed.  The cyst was opened with the harmonic, and the cyst fluid was sent for cytology.  The cyst wall was then carefully retracted and taken off the dome of the liver with the harmonic.  The posterior aspect was NOT cauterized due to close proximity of the right hepatic vein. A second contiguous cyst was opened and marsupialized as well.  The area was inspected for hemostasis and bile leakage.  None was seen.    Pneumoperitoneum was released as we removed the trocars.   The pursestring suture was used to close the umbilical fascia.  4-0 Monocryl was used to close the skin.   The skin was cleaned and dry, and Dermabond was applied. The patient was then extubated and brought to the recovery room in stable condition. Instrument, sponge, and needle counts were correct at closure and at the conclusion of the case.   Findings: Large liver cyst without complex features, mild chronic inflammation of gallbladder.    Estimated Blood Loss: min         Drains: none          Specimens: Gallbladder to pathology, cyst fluid to cytology, cyst wall to pathology       Complications: None; patient tolerated the procedure well.         Disposition: PACU - hemodynamically stable.         Condition: stable

## 2013-09-18 ENCOUNTER — Encounter (HOSPITAL_COMMUNITY): Payer: Self-pay | Admitting: General Surgery

## 2013-09-18 LAB — CBC
HCT: 37.4 % (ref 36.0–46.0)
HEMOGLOBIN: 12.6 g/dL (ref 12.0–15.0)
MCH: 29.2 pg (ref 26.0–34.0)
MCHC: 33.7 g/dL (ref 30.0–36.0)
MCV: 86.8 fL (ref 78.0–100.0)
Platelets: 203 10*3/uL (ref 150–400)
RBC: 4.31 MIL/uL (ref 3.87–5.11)
RDW: 12.7 % (ref 11.5–15.5)
WBC: 8.6 10*3/uL (ref 4.0–10.5)

## 2013-09-18 LAB — COMPREHENSIVE METABOLIC PANEL
ALK PHOS: 33 U/L — AB (ref 39–117)
ALT: 39 U/L — AB (ref 0–35)
AST: 50 U/L — ABNORMAL HIGH (ref 0–37)
Albumin: 3.2 g/dL — ABNORMAL LOW (ref 3.5–5.2)
BUN: 9 mg/dL (ref 6–23)
CALCIUM: 8.6 mg/dL (ref 8.4–10.5)
CO2: 27 mEq/L (ref 19–32)
Chloride: 106 mEq/L (ref 96–112)
Creatinine, Ser: 0.66 mg/dL (ref 0.50–1.10)
GFR calc Af Amer: >90 mL/min (ref >90)
GFR calc non Af Amer: 89 mL/min — ABNORMAL LOW (ref >90)
Glucose, Bld: 126 mg/dL — ABNORMAL HIGH (ref 70–99)
Potassium: 4.1 mEq/L (ref 3.7–5.3)
SODIUM: 141 meq/L (ref 137–147)
TOTAL PROTEIN: 5.9 g/dL — AB (ref 6.0–8.3)
Total Bilirubin: 0.5 mg/dL (ref 0.3–1.2)

## 2013-09-18 LAB — PHOSPHORUS: Phosphorus: 3.7 mg/dL (ref 2.3–4.6)

## 2013-09-18 LAB — MAGNESIUM: Magnesium: 1.9 mg/dL (ref 1.5–2.5)

## 2013-09-18 MED ORDER — OXYCODONE-ACETAMINOPHEN 5-325 MG PO TABS: 1.0000 | ORAL_TABLET | ORAL | Status: DC | PRN

## 2013-09-18 NOTE — Discharge Summary (Signed)
Physician Discharge Summary  Patient ID: Lorraine Woods MRN: [id number] DOB/AGE: [birth year] 67 y.o.  Admit date: 09/17/2013 Discharge date: 09/18/2013  Admission Diagnoses: Patient Active Problem List   Diagnosis Date Noted  . Gallbladder polyp 08/19/2013    Priority: High  . Solitary pulmonary nodule 08/04/2013  . Vulvar itching 04/07/2013  . Social anxiety disorder 03/03/2013  . Migraine without aura, not refractory 03/03/2013  . Unspecified vitamin D deficiency 03/03/2013  . Alopecia of scalp 03/03/2013  . History of basal cell carcinoma of skin 03/03/2013  . Liver cyst 03/03/2013    Discharge Diagnoses:  Active Problems:   Liver cyst (same)  Discharged Condition: good  Hospital Course:  Pt admitted to the floor following lap liver cyst unroofing and lap chole.  She did well overnight.  She did not have any nausea or vomiting.  Her pain was controlled with oral narcotics.  She is ambulating independently and is voiding spontaneously.  The right upper quadrant pain is improved.    Consults: None  Significant Diagnostic Studies: labs: see epic.  LFTs sl increased, bili OK, HCT 37.4  Treatments: surgery: see above  Discharge Exam: Blood pressure 127/74, pulse 65, temperature 98 F (36.7 C), temperature source Oral, resp. rate 18, height 5\' 10"  (1.778 m), weight 191 lb 8 oz (86.864 kg), SpO2 97.00%. General appearance: alert, cooperative and no distress Resp: breathing comfortably GI: soft, non tender, non distended, bruising at umbilical incision  Disposition: 01-Home or Self Care  Discharge Orders   Future Orders Complete By Expires   Call MD for:  difficulty breathing, headache or visual disturbances  As directed    Call MD for:  hives  As directed    Call MD for:  persistant nausea and vomiting  As directed    Call MD for:  redness, tenderness, or signs of infection (pain, swelling, redness, odor or green/yellow discharge around incision site)  As directed    Call  MD for:  severe uncontrolled pain  As directed    Call MD for:  temperature >100.4  As directed    Diet - low sodium heart healthy  As directed    Increase activity slowly  As directed        Medication List         BIOTIN PO  Take 1,000 mcg by mouth every morning.     minoxidil 2 % external solution  Commonly known as:  ROGAINE  Apply topically 2 (two) times daily.     multivitamin with minerals Tabs tablet  Take 1 tablet by mouth every morning.     oxyCODONE-acetaminophen 5-325 MG per tablet  Commonly known as:  PERCOCET/ROXICET  Take 1-2 tablets by mouth every 4 (four) hours as needed for moderate pain.     oxyCODONE-acetaminophen 5-325 MG per tablet  Commonly known as:  PERCOCET/ROXICET  Take 1-2 tablets by mouth every 4 (four) hours as needed for moderate pain.     sertraline 100 MG tablet  Commonly known as:  ZOLOFT  Take 100 mg by mouth every morning.     SUMAtriptan 100 MG tablet  Commonly known as:  IMITREX  Take 1 tablet (100 mg total) by mouth every 2 (two) hours as needed for migraine. May repeat in 2 hours if headache persists or recurs.     Vitamin D3 2000 UNITS Tabs  Take 2,000 Units by mouth every morning.           Follow-up Information   Follow up with  Tris Howell, MD. Schedule an appointment as soon as possible for a visit in 2 weeks.   Specialty:  General Surgery   Contact information:   [address] [place] [address] [phone]       Signed: Stark Klein 09/18/2013, 8:58 AM

## 2013-09-18 NOTE — Progress Notes (Signed)
Pt was d/c to home and taken down by CNA. Pt verbalized understanding of all D/c meds. Pt is hopeful and anticipating a speedy recovery. No new changes since am assessment.   Birdie Hopes 09/18/2013

## 2013-10-12 ENCOUNTER — Ambulatory Visit (INDEPENDENT_AMBULATORY_CARE_PROVIDER_SITE_OTHER): Payer: Commercial Managed Care - PPO | Admitting: General Surgery

## 2013-10-12 ENCOUNTER — Encounter (INDEPENDENT_AMBULATORY_CARE_PROVIDER_SITE_OTHER): Payer: Self-pay

## 2013-10-12 ENCOUNTER — Encounter (INDEPENDENT_AMBULATORY_CARE_PROVIDER_SITE_OTHER): Payer: Self-pay | Admitting: General Surgery

## 2013-10-12 VITALS — BP 130/76 | HR 74 | Temp 97.8°F | Ht 70.0 in | Wt 186.0 lb

## 2013-10-12 DIAGNOSIS — K824 Cholesterolosis of gallbladder: Secondary | ICD-10-CM

## 2013-10-12 DIAGNOSIS — K7689 Other specified diseases of liver: Secondary | ICD-10-CM

## 2013-10-12 NOTE — Patient Instructions (Signed)
Follow up as needed.  Call for concerns.    Ok to go back to work.

## 2013-10-12 NOTE — Progress Notes (Signed)
HISTORY: Pt is 3-4 weeks s/p lap chole/lap liver cyst unroofing.  She is doing very well other than being slightly tired.  She is not having any pain, nausea, or vomiting.  She is eating OK.      EXAM: General:  Alert and oriented   Incision:  Healing well.  No evidence of infection or hernia.     PATHOLOGY: Diagnosis 1. Gallbladder - MILD CHRONIC CHOLECYSTITIS WITH BENIGN CHOLESTEROL POLYP. 2. Liver, partial resection, cyst wall - BENIGN CYST. - NO EVIDENCE OF MALIGNANCY. - SEE MICROSCOPIC DESCRIPTION.   ASSESSMENT AND PLAN:   Liver cyst Benign pathology  Follow up as needed.    Gallbladder polyp Benign, chronic cholecystitis.    No evidence of surgical complications.        Milus Height, MD Surgical Oncology, [facility]  METHENEY,CATHERINE, MD Hali Marry, *

## 2013-10-12 NOTE — Assessment & Plan Note (Addendum)
Benign, chronic cholecystitis.    No evidence of surgical complications.

## 2013-10-12 NOTE — Assessment & Plan Note (Signed)
Benign pathology  Follow up as needed.

## 2013-12-21 ENCOUNTER — Other Ambulatory Visit: Payer: Self-pay | Admitting: Family Medicine

## 2013-12-29 ENCOUNTER — Ambulatory Visit (INDEPENDENT_AMBULATORY_CARE_PROVIDER_SITE_OTHER): Payer: Commercial Managed Care - PPO | Admitting: Family Medicine

## 2013-12-29 ENCOUNTER — Encounter: Payer: Self-pay | Admitting: Family Medicine

## 2013-12-29 VITALS — BP 118/61 | HR 93 | Wt 190.0 lb

## 2013-12-29 DIAGNOSIS — F4010 Social phobia, unspecified: Secondary | ICD-10-CM

## 2013-12-29 DIAGNOSIS — Z23 Encounter for immunization: Secondary | ICD-10-CM

## 2013-12-29 DIAGNOSIS — J302 Other seasonal allergic rhinitis: Secondary | ICD-10-CM

## 2013-12-29 DIAGNOSIS — J3089 Other allergic rhinitis: Secondary | ICD-10-CM

## 2013-12-29 MED ORDER — SERTRALINE HCL 100 MG PO TABS: 100.0000 mg | ORAL_TABLET | Freq: Every morning | ORAL | Status: DC

## 2013-12-29 NOTE — Progress Notes (Signed)
   Subjective:    Patient ID: Lorraine Woods, female    DOB: 15-Sep-1945, 68 y.o.   MRN: [id number]  HPI Here today for followup of social anxiety-she does describe feeling nervous and on edge several days a week and often feeling like she worries too much about things several days a week. She currently takes sertraline 100 mg daily.No side effects.   She has had cyst removed off her liver and had her gallbladder removed since I last saw her. She was having some right upper quadrant pain and had a CT scan showing cysts and stones in her gallbladder.   Has been having some allergies and right ear pain.  Getting scratchy nose or throat.  Using claritin and nasla spray as recommended by the nurse at her workplace. She says it has helped some but not completely. The sore throat is still coming and going every few days. She's also having some postnasal drip. No cough or significant nasal congestion or fevers or chills or sweats.   Review of Systems     Objective:   Physical Exam  Constitutional: She is oriented to person, place, and time. She appears well-developed and well-nourished.  HENT:  Head: Normocephalic and atraumatic.  Right Ear: External ear normal.  Left Ear: External ear normal.  Nose: Nose normal.  Mouth/Throat: Oropharynx is clear and moist.  TMs and canals are clear.   Eyes: Conjunctivae and EOM are normal. Pupils are equal, round, and reactive to light.  Neck: Neck supple. No thyromegaly present.  Cardiovascular: Normal rate, regular rhythm and normal heart sounds.   Pulmonary/Chest: Effort normal and breath sounds normal. She has no wheezes.  Lymphadenopathy:    She has no cervical adenopathy.  Neurological: She is alert and oriented to person, place, and time.  Skin: Skin is warm and dry.  Psychiatric: She has a normal mood and affect.          Assessment & Plan:  Social anxiety disorder-gad 7 score of 2 today. Well controlled. Continue current regimen. She's not  interested in weaning the medication at this time.  AR- Discussed switching to Allegra versus Claritin. Continue nasal steroid. Avoid exposure. Limit activities outside [employer]. If she's not improving then please let me know. Or she develops any fevers chills or sweats and please call back.

## 2014-03-23 ENCOUNTER — Ambulatory Visit (INDEPENDENT_AMBULATORY_CARE_PROVIDER_SITE_OTHER): Payer: Commercial Managed Care - PPO | Admitting: Family Medicine

## 2014-03-23 ENCOUNTER — Telehealth: Payer: Self-pay | Admitting: *Deleted

## 2014-03-23 ENCOUNTER — Encounter: Payer: Self-pay | Admitting: Family Medicine

## 2014-03-23 VITALS — BP 110/63 | HR 75 | Ht 70.0 in | Wt 193.0 lb

## 2014-03-23 DIAGNOSIS — B351 Tinea unguium: Secondary | ICD-10-CM

## 2014-03-23 DIAGNOSIS — Z780 Asymptomatic menopausal state: Secondary | ICD-10-CM

## 2014-03-23 DIAGNOSIS — N889 Noninflammatory disorder of cervix uteri, unspecified: Secondary | ICD-10-CM

## 2014-03-23 DIAGNOSIS — R911 Solitary pulmonary nodule: Secondary | ICD-10-CM

## 2014-03-23 DIAGNOSIS — E559 Vitamin D deficiency, unspecified: Secondary | ICD-10-CM

## 2014-03-23 DIAGNOSIS — N760 Acute vaginitis: Secondary | ICD-10-CM

## 2014-03-23 DIAGNOSIS — Z0000 Encounter for general adult medical examination without abnormal findings: Secondary | ICD-10-CM

## 2014-03-23 MED ORDER — SUMATRIPTAN SUCCINATE 100 MG PO TABS: 100.0000 mg | ORAL_TABLET | ORAL | Status: DC | PRN

## 2014-03-23 MED ORDER — CICLOPIROX 8 % EX SOLN: Freq: Every day | CUTANEOUS | Status: DC

## 2014-03-23 NOTE — Progress Notes (Signed)
Subjective:     Lorraine Woods is a 68 y.o. female and is here for a comprehensive physical exam. The patient reports problems - toenail fungus. She would like to have something topical for treatment. She does not want to take oral Lamisil. Reports that she did have her mammogram done at [facility] in January. She says it's been several years since her last bone density test.  History   Social History  . Marital Status: Married    Spouse Name: John     Number of Children: 2  . 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 Smoking status: Never Smoker   . Smokeless tobacco: Never Used  . Alcohol Use: Yes     Comment: occasional wine -once monthly  . Drug Use: No  . Sexual Activity: Yes   Other Topics Concern  . Not on file   Social History Narrative  . No narrative on file   Health Maintenance  Topic Date Due  . Pneumococcal Polysaccharide Vaccine Age 51 And Over  08/10/2011  . Influenza Vaccine  01/02/2014  . Mammogram  06/04/2014  . Colonoscopy  06/04/2016  . Tetanus/tdap  03/05/2019  . Zostavax  Completed    The following portions of the patient's history were reviewed and updated as appropriate: allergies, current medications, past family history, past medical history, past social history, past surgical history and problem list.  Review of Systems A comprehensive review of systems was negative.   Objective:    BP 110/63  Pulse 75  Ht 5\' 10"  (1.778 m)  Wt 193 lb (87.544 kg)  BMI 27.69 kg/m2 General appearance: alert, cooperative and appears stated age Head: Normocephalic, without obvious abnormality, atraumatic Eyes: conj clare, EOMI, PEERLA Ears: normal TM's and external ear canals both ears Nose: Nares normal. Septum midline. Mucosa normal. No drainage or sinus tenderness. Throat: lips, mucosa, and tongue normal; teeth and gums normal Neck: no adenopathy, no carotid  bruit, no JVD, supple, symmetrical, trachea midline and thyroid not enlarged, symmetric, no tenderness/mass/nodules Back: symmetric, no curvature. ROM normal. No CVA tenderness. Lungs: clear to auscultation bilaterally Breasts: normal appearance, no masses or tenderness Heart: regular rate and rhythm, S1, S2 normal, no murmur, click, rub or gallop Abdomen: soft, non-tender; bowel sounds normal; no masses,  no organomegaly Pelvic: cervix normal in appearance, external genitalia normal, no adnexal masses or tenderness, no cervical motion tenderness, rectovaginal septum normal, uterus normal size, shape, and consistency and vagina normal without discharge Extremities: extremities normal, atraumatic, no cyanosis or edema Pulses: 2+ and symmetric Skin: Skin color, texture, turgor normal. No rashes or lesions Lymph nodes: Cervical, supraclavicular, and axillary nodes normal. Neurologic: Alert and oriented X 3, normal strength and tone. Normal symmetric reflexes. Normal coordination and gait    Assessment:    Healthy female exam.      Plan:     See After Visit Summary for Counseling Recommendations  Keep up a regular exercise program and make sure you are eating a healthy diet Try to eat 4 servings of dairy a day, or if you are lactose intolerant take a calcium with vitamin D daily.  Your vaccines are up to date.   oncychomycosis - will tx with penlac. She prefers topical tx. Will need to tx for 9-12 months.    We'll plan to order a DEXA scan in January with her mammogram  So  that  times and dates can be coordinated  Due to f/u pulm nodule seen on Abdominal CT.

## 2014-03-23 NOTE — Telephone Encounter (Signed)
No prior auth required as per Amy/[facility] call center. Radiology notified. Margette Fast, CMA

## 2014-03-23 NOTE — Patient Instructions (Signed)
Keep up a regular exercise program and make sure you are eating a healthy diet Try to eat 4 servings of dairy a day, or if you are lactose intolerant take a calcium with vitamin D daily.  Your vaccines are up to date.   

## 2014-03-24 LAB — COMPLETE METABOLIC PANEL WITH GFR
ALT: 16 U/L (ref 0–35)
AST: 20 U/L (ref 0–37)
Albumin: 4.3 g/dL (ref 3.5–5.2)
Alkaline Phosphatase: 47 U/L (ref 39–117)
BILIRUBIN TOTAL: 0.8 mg/dL (ref 0.2–1.2)
BUN: 17 mg/dL (ref 6–23)
CALCIUM: 9.1 mg/dL (ref 8.4–10.5)
CHLORIDE: 106 meq/L (ref 96–112)
CO2: 25 meq/L (ref 19–32)
CREATININE: 0.62 mg/dL (ref 0.50–1.10)
GLUCOSE: 94 mg/dL (ref 70–99)
Potassium: 4.1 mEq/L (ref 3.5–5.3)
Sodium: 140 mEq/L (ref 135–145)
Total Protein: 7 g/dL (ref 6.0–8.3)

## 2014-03-24 LAB — LIPID PANEL
Cholesterol: 200 mg/dL (ref 0–200)
HDL: 57 mg/dL (ref >39)
LDL Cholesterol: 121 mg/dL — ABNORMAL HIGH (ref 0–99)
TRIGLYCERIDES: 108 mg/dL (ref <150)
Total CHOL/HDL Ratio: 3.5 Ratio
VLDL: 22 mg/dL (ref 0–40)

## 2014-03-24 LAB — TSH: TSH: 1.527 u[IU]/mL (ref 0.350–4.500)

## 2014-03-24 LAB — VITAMIN D 25 HYDROXY (VIT D DEFICIENCY, FRACTURES): Vit D, 25-Hydroxy: 54 ng/mL (ref 30–89)

## 2014-03-30 ENCOUNTER — Other Ambulatory Visit: Payer: Commercial Managed Care - PPO

## 2014-04-06 ENCOUNTER — Ambulatory Visit (INDEPENDENT_AMBULATORY_CARE_PROVIDER_SITE_OTHER): Payer: Commercial Managed Care - PPO

## 2014-04-06 DIAGNOSIS — R918 Other nonspecific abnormal finding of lung field: Secondary | ICD-10-CM

## 2014-04-06 DIAGNOSIS — R911 Solitary pulmonary nodule: Secondary | ICD-10-CM

## 2014-04-06 DIAGNOSIS — Z0389 Encounter for observation for other suspected diseases and conditions ruled out: Secondary | ICD-10-CM

## 2014-06-08 ENCOUNTER — Encounter: Payer: Self-pay | Admitting: Family Medicine

## 2014-06-30 ENCOUNTER — Encounter: Payer: Self-pay | Admitting: Family Medicine

## 2014-06-30 ENCOUNTER — Telehealth: Payer: Self-pay | Admitting: Family Medicine

## 2014-06-30 DIAGNOSIS — M8580 Other specified disorders of bone density and structure, unspecified site: Secondary | ICD-10-CM | POA: Insufficient documentation

## 2014-06-30 NOTE — Telephone Encounter (Signed)
Please call patient with her know that I got her bone density report. Her T score in the lumbar spine was -1.3. This does put her into the category of osteopenia which is mildly thin bones. Main treatment is regular exercise in addition to adequate calcium. This is approximately 12 mg total per day. In addition to vitamin D. 800 international units daily. Recommend repeat bone density in 2 years.

## 2014-07-05 ENCOUNTER — Encounter: Payer: Self-pay | Admitting: Family Medicine

## 2014-07-06 ENCOUNTER — Encounter: Payer: Self-pay | Admitting: Family Medicine

## 2014-07-07 NOTE — Telephone Encounter (Signed)
Pt was responded to by Dr. Madilyn Fireman via pt email.Audelia Hives [place]

## 2014-08-10 ENCOUNTER — Encounter: Payer: Self-pay | Admitting: Family Medicine

## 2014-08-10 ENCOUNTER — Ambulatory Visit (INDEPENDENT_AMBULATORY_CARE_PROVIDER_SITE_OTHER): Payer: Commercial Managed Care - PPO | Admitting: Family Medicine

## 2014-08-10 VITALS — BP 134/66 | HR 75 | Wt 193.0 lb

## 2014-08-10 DIAGNOSIS — R1012 Left upper quadrant pain: Secondary | ICD-10-CM

## 2014-08-10 LAB — COMPLETE METABOLIC PANEL WITH GFR
ALBUMIN: 4.3 g/dL (ref 3.5–5.2)
ALK PHOS: 42 U/L (ref 39–117)
ALT: 14 U/L (ref 0–35)
AST: 18 U/L (ref 0–37)
BUN: 20 mg/dL (ref 6–23)
CALCIUM: 9.3 mg/dL (ref 8.4–10.5)
CO2: 27 mEq/L (ref 19–32)
Chloride: 104 mEq/L (ref 96–112)
Creat: 0.62 mg/dL (ref 0.50–1.10)
GFR, Est African American: >89 mL/min
GFR, Est Non African American: >89 mL/min
Glucose, Bld: 97 mg/dL (ref 70–99)
Potassium: 4 mEq/L (ref 3.5–5.3)
SODIUM: 139 meq/L (ref 135–145)
TOTAL PROTEIN: 6.9 g/dL (ref 6.0–8.3)
Total Bilirubin: 0.6 mg/dL (ref 0.2–1.2)

## 2014-08-10 LAB — CBC WITH DIFFERENTIAL/PLATELET
BASOS ABS: 0.1 10*3/uL (ref 0.0–0.1)
Basophils Relative: 1 % (ref 0–1)
EOS ABS: 0.2 10*3/uL (ref 0.0–0.7)
Eosinophils Relative: 3 % (ref 0–5)
HCT: 42.2 % (ref 36.0–46.0)
Hemoglobin: 14.5 g/dL (ref 12.0–15.0)
LYMPHS ABS: 1.5 10*3/uL (ref 0.7–4.0)
Lymphocytes Relative: 28 % (ref 12–46)
MCH: 29.7 pg (ref 26.0–34.0)
MCHC: 34.4 g/dL (ref 30.0–36.0)
MCV: 86.3 fL (ref 78.0–100.0)
MPV: 9.8 fL (ref 8.6–12.4)
Monocytes Absolute: 0.4 10*3/uL (ref 0.1–1.0)
Monocytes Relative: 7 % (ref 3–12)
Neutro Abs: 3.3 10*3/uL (ref 1.7–7.7)
Neutrophils Relative %: 61 % (ref 43–77)
PLATELETS: 266 10*3/uL (ref 150–400)
RBC: 4.89 MIL/uL (ref 3.87–5.11)
RDW: 12.9 % (ref 11.5–15.5)
WBC: 5.4 10*3/uL (ref 4.0–10.5)

## 2014-08-10 LAB — LIPASE: Lipase: 29 U/L (ref 0–75)

## 2014-08-10 LAB — AMYLASE: Amylase: 51 U/L (ref 0–105)

## 2014-08-10 NOTE — Progress Notes (Signed)
Subjective:    Patient ID: Lorraine Woods, female    DOB: December 09, 1945, 69 y.o.   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  Comes and goes, dull pain. Tried some topcial creams with no relief. Taking an OTC NSAID and it did help. Thinks TUMs might help too. Worse in the AM.  Movement doesn't make it worse. No changes with eating.  No N/V/D.Does have some intermittant indigestion when drinks coffee without eating.  Pain was sharp about 3 times. Hx of cholecystectomy 1 years ago.  Says occ wakes up feeling hot with this pain as well. She is a side sleeper and sleeps on the right side. Pain is mild to moderate.  No pleurisy. Never smokes.     Review of Systems     BP 134/66 mmHg  Pulse 75  Wt 193 lb (87.544 kg)  SpO2 96%    Allergies  Allergen Reactions  . Aspirin Other (See Comments)    Bruising.   . Excedrin Migraine [Aspirin-Acetaminophen-Caffeine] Other (See Comments)    Bruising     Past Medical History  Diagnosis Date  . Anxiety   . Knee pain, chronic   . Wears glasses   . Liver cyst   . Basal cell carcinoma     nose  . Migraine     possible 1 every 2 months    Past Surgical History  Procedure Laterality Date  . Appendectomy  1959  . Tubal ligation  1982  . Skin cancer excision    . Upper endoscopy w/ sclerotherapy    . Ankle fracture surgery Left   . Vein surgery Left   . Breast lumpectomy Right     on HRT, fibromadenoma  . Orif ankle fracture  2000    left  . Breast excisional biopsy  2002    noncancerous lump  . Skin cancer excision      basil cell lerion on nose  . Sclerotherapy on left leg  2013  . Laparoscopic liver cyst unroofing N/A 09/17/2013    Procedure: LAPAROSCOPIC LIVER CYST UNROOFING;  Surgeon: Stark Klein, MD;  Location: [facility];  Service: General;   Laterality: N/A;  . Cholecystectomy N/A 09/17/2013    Procedure: LAPAROSCOPIC CHOLECYSTECTOMY;  Surgeon: Stark Klein, MD;  Location: [facility];  Service: General;  Laterality: N/A;    History   Social History  . Marital Status: Married    Spouse Name: Jenny Reichmann   . Number of Children: 2  . 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 Smoking status: Never Smoker   . Smokeless tobacco: Never Used  . Alcohol Use: Yes     Comment: occasional wine -once monthly  . Drug Use: No  . Sexual Activity: Yes   Other Topics Concern  . Not on file   Social History Narrative    Family History  Problem Relation Age of Onset  . Brain cancer Mother 28  . Diabetes Father   . Hypertension Father   . CAD Father   . Heart disease Father   . Colon polyps Son     Puetz-jehgers syndrome  Outpatient Encounter Prescriptions as of 08/10/2014  Medication Sig  . BIOTIN PO Take 1,000 mcg by mouth every morning.   . calcium gluconate (CALCIUM GLUCONATE) 100 mg/mL SOLN Take 500 mg by mouth daily.  . Cholecalciferol (VITAMIN D3) 2000 UNITS TABS Take 2,000 Units by mouth every morning.   . ciclopirox (PENLAC) 8 % solution Apply topically at bedtime. Apply over nail and surrounding skin. Apply daily over previous coat. After seven (7) days, may remove with alcohol and continue cycle.  . loratadine (CLARITIN) 10 MG tablet Take 10 mg by mouth daily.  . minoxidil (ROGAINE) 2 % external solution Apply topically 2 (two) times daily.  . Multiple Vitamin (MULTIVITAMIN WITH MINERALS) TABS tablet Take 1 tablet by mouth every morning.  . sertraline (ZOLOFT) 100 MG tablet Take 1 tablet (100 mg total) by mouth every morning.  . SUMAtriptan (IMITREX) 100 MG tablet Take 1 tablet (100 mg total) by mouth every 2 (two) hours as needed for migraine. May repeat in 2 hours if headache persists or recurs.       Objective:   Physical Exam   Constitutional: She is oriented to person, place, and time. She appears well-developed and well-nourished.  HENT:  Head: Normocephalic and atraumatic.  Cardiovascular: Normal rate, regular rhythm and normal heart sounds.   Pulmonary/Chest: Effort normal and breath sounds normal.  Abdominal: Soft. Bowel sounds are normal. She exhibits no distension and no mass. There is tenderness. There is no rebound and no guarding.  + TTP over the epigastrum.  Nontender over the ribs or over the lower edge of the breast. No nodules.    Neurological: She is alert and oriented to person, place, and time.  Skin: Skin is warm and dry.  Psychiatric: She has a normal mood and affect. Her behavior is normal.          Assessment & Plan:  Flank Pain -  unclear etiology. She does get a little better relief when she eats Tums and she is more tender in epigastric area but her pain is clearly in the right upper quadrant just below the breast area underneath the ribs. Will schedule her for ultrasound. She does have a history of having her gallbladder removed but we can certainly look at the liver for other abnormalities and see if the Doppler duct is dilated. We'll also check liver enzymes as well as a complete blood count to look for infectious causes. If everything comes back normal then consider chest x-ray for further evaluation.

## 2014-08-12 ENCOUNTER — Ambulatory Visit (INDEPENDENT_AMBULATORY_CARE_PROVIDER_SITE_OTHER): Payer: Commercial Managed Care - PPO

## 2014-08-12 DIAGNOSIS — K7689 Other specified diseases of liver: Secondary | ICD-10-CM

## 2014-08-12 DIAGNOSIS — Z9049 Acquired absence of other specified parts of digestive tract: Secondary | ICD-10-CM

## 2014-08-12 DIAGNOSIS — R1012 Left upper quadrant pain: Secondary | ICD-10-CM

## 2014-09-03 ENCOUNTER — Telehealth: Payer: Self-pay | Admitting: Family Medicine

## 2014-09-03 NOTE — Telephone Encounter (Signed)
Received documentation from Pt's insurance stating she was 75% adherence on her medication (Sertraline tab 100mg ). Called Pt to see if there was a barrier preventing her from taking this medication, no answer. Left voicemail and provided callback information.

## 2014-09-21 ENCOUNTER — Ambulatory Visit: Payer: Commercial Managed Care - PPO | Admitting: Family Medicine

## 2014-10-12 ENCOUNTER — Encounter: Payer: Self-pay | Admitting: Family Medicine

## 2014-10-12 ENCOUNTER — Ambulatory Visit (INDEPENDENT_AMBULATORY_CARE_PROVIDER_SITE_OTHER): Payer: Commercial Managed Care - PPO | Admitting: Family Medicine

## 2014-10-12 VITALS — BP 123/55 | HR 74 | Ht 70.0 in | Wt 192.0 lb

## 2014-10-12 DIAGNOSIS — N889 Noninflammatory disorder of cervix uteri, unspecified: Secondary | ICD-10-CM

## 2014-10-12 DIAGNOSIS — F4010 Social phobia, unspecified: Secondary | ICD-10-CM

## 2014-10-12 DIAGNOSIS — M25562 Pain in left knee: Secondary | ICD-10-CM | POA: Diagnosis not present

## 2014-10-12 DIAGNOSIS — N760 Acute vaginitis: Secondary | ICD-10-CM | POA: Diagnosis not present

## 2014-10-12 MED ORDER — SUMATRIPTAN SUCCINATE 100 MG PO TABS: 100.0000 mg | ORAL_TABLET | ORAL | Status: DC | PRN

## 2014-10-12 MED ORDER — SERTRALINE HCL 100 MG PO TABS: 100.0000 mg | ORAL_TABLET | Freq: Every morning | ORAL | Status: DC

## 2014-10-12 MED ORDER — DICLOFENAC SODIUM 2 % TD SOLN: 1.0000 "application " | Freq: Two times a day (BID) | TRANSDERMAL | Status: DC

## 2014-10-12 NOTE — Progress Notes (Signed)
   Subjective:    Patient ID: Lorraine Woods, female    DOB: December 13, 1945, 69 y.o.   MRN: [id number]  HPI Follow up social anxiety disorder-she is currently on sertraline 100 mg daily.gad 7 score of 2 today. Previous was 2 as well. She rates her symptoms as not difficult.  No concern or S.E.  Her husband recently started CPAP and that has been bothering her sleep.  She has been working long days.    Follow-up migraine headaches-she's not on anything for prophylaxis currently but does take Imitrex as needed.  She is having maybe one or less per month.    She's also had left knee pain from her severe arthritis for the last several years. She got an injection of most 2 years ago and it worked well  For him as to year and half. She started having pain again a few months ago. She has a lot of crepitus and notices more discomfort going up steps. She has a ruptured Baker cyst in that knee at one point in time. She wants to know when she should schedule another shot. She has a trip coming up in the fall.  Review of Systems     Objective:   Physical Exam  Constitutional: She is oriented to person, place, and time. She appears well-developed and well-nourished.  HENT:  Head: Normocephalic and atraumatic.  Cardiovascular: Normal rate, regular rhythm and normal heart sounds.   Pulmonary/Chest: Effort normal and breath sounds normal.  Neurological: She is alert and oriented to person, place, and time.  Skin: Skin is warm and dry.  Psychiatric: She has a normal mood and affect. Her behavior is normal.          Assessment & Plan:  Social anxiety  - doing well on current regimen. Will refill sertraline. Follow-up in 6 months for wellness exam. F/U  In 6-9 month.   Migraine HA - well-controlled. Continue. Imitrex PRN.  Left knee pain - recommend a trial of topical Pennsaid. Certainly can schedule for an injection before she leaves for her trip in the fall.

## 2015-04-19 ENCOUNTER — Encounter: Payer: Self-pay | Admitting: Family Medicine

## 2015-04-19 ENCOUNTER — Ambulatory Visit (INDEPENDENT_AMBULATORY_CARE_PROVIDER_SITE_OTHER): Payer: Commercial Managed Care - PPO | Admitting: Family Medicine

## 2015-04-19 VITALS — BP 131/57 | HR 74 | Ht 70.0 in | Wt 191.7 lb

## 2015-04-19 DIAGNOSIS — J069 Acute upper respiratory infection, unspecified: Secondary | ICD-10-CM

## 2015-04-19 DIAGNOSIS — Z1159 Encounter for screening for other viral diseases: Secondary | ICD-10-CM | POA: Diagnosis not present

## 2015-04-19 DIAGNOSIS — N760 Acute vaginitis: Secondary | ICD-10-CM

## 2015-04-19 DIAGNOSIS — Z0000 Encounter for general adult medical examination without abnormal findings: Secondary | ICD-10-CM | POA: Diagnosis not present

## 2015-04-19 DIAGNOSIS — N889 Noninflammatory disorder of cervix uteri, unspecified: Secondary | ICD-10-CM | POA: Diagnosis not present

## 2015-04-19 DIAGNOSIS — M8580 Other specified disorders of bone density and structure, unspecified site: Secondary | ICD-10-CM | POA: Diagnosis not present

## 2015-04-19 DIAGNOSIS — J040 Acute laryngitis: Secondary | ICD-10-CM

## 2015-04-19 LAB — COMPLETE METABOLIC PANEL WITH GFR
ALBUMIN: 4.1 g/dL (ref 3.6–5.1)
ALT: 16 U/L (ref 6–29)
AST: 21 U/L (ref 10–35)
Alkaline Phosphatase: 39 U/L (ref 33–130)
BILIRUBIN TOTAL: 0.8 mg/dL (ref 0.2–1.2)
BUN: 15 mg/dL (ref 7–25)
CALCIUM: 9.2 mg/dL (ref 8.6–10.4)
CHLORIDE: 103 mmol/L (ref 98–110)
CO2: 27 mmol/L (ref 20–31)
CREATININE: 0.65 mg/dL (ref 0.50–0.99)
GFR, Est Non African American: >89 mL/min (ref >=60)
Glucose, Bld: 106 mg/dL — ABNORMAL HIGH (ref 65–99)
Potassium: 4.1 mmol/L (ref 3.5–5.3)
Sodium: 141 mmol/L (ref 135–146)
TOTAL PROTEIN: 6.9 g/dL (ref 6.1–8.1)

## 2015-04-19 LAB — LIPID PANEL
CHOLESTEROL: 188 mg/dL (ref 125–200)
HDL: 56 mg/dL (ref >=46)
LDL Cholesterol: 110 mg/dL (ref <130)
Total CHOL/HDL Ratio: 3.4 Ratio (ref <=5.0)
Triglycerides: 109 mg/dL (ref <150)
VLDL: 22 mg/dL (ref <30)

## 2015-04-19 MED ORDER — SUMATRIPTAN SUCCINATE 100 MG PO TABS: 100.0000 mg | ORAL_TABLET | ORAL | Status: DC | PRN

## 2015-04-19 MED ORDER — HYDROCODONE-HOMATROPINE 5-1.5 MG/5ML PO SYRP: 5.0000 mL | ORAL_SOLUTION | Freq: Every evening | ORAL | Status: DC | PRN
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
## 2015-04-20 LAB — VITAMIN D 25 HYDROXY (VIT D DEFICIENCY, FRACTURES): Vit D, 25-Hydroxy: 42 ng/mL (ref 30–100)

## 2015-04-20 LAB — HEPATITIS C ANTIBODY: HCV AB: NEGATIVE

## 2015-07-07 LAB — HM MAMMOGRAPHY

## 2015-07-12 ENCOUNTER — Encounter: Payer: Self-pay | Admitting: Family Medicine

## 2015-07-26 ENCOUNTER — Other Ambulatory Visit: Payer: Self-pay

## 2015-07-26 ENCOUNTER — Encounter: Payer: Self-pay | Admitting: Family Medicine

## 2015-07-26 ENCOUNTER — Ambulatory Visit (INDEPENDENT_AMBULATORY_CARE_PROVIDER_SITE_OTHER): Payer: Commercial Managed Care - PPO | Admitting: Family Medicine

## 2015-07-26 VITALS — BP 121/64 | HR 120 | Temp 100.2°F | Wt 188.0 lb

## 2015-07-26 DIAGNOSIS — R509 Fever, unspecified: Secondary | ICD-10-CM | POA: Diagnosis not present

## 2015-07-26 DIAGNOSIS — J101 Influenza due to other identified influenza virus with other respiratory manifestations: Secondary | ICD-10-CM | POA: Diagnosis not present

## 2015-07-26 LAB — POCT INFLUENZA A/B
INFLUENZA A, POC: POSITIVE — AB
INFLUENZA B, POC: NEGATIVE

## 2015-07-26 MED ORDER — OSELTAMIVIR PHOSPHATE 75 MG PO CAPS: 75.0000 mg | ORAL_CAPSULE | Freq: Two times a day (BID) | ORAL | Status: DC

## 2015-07-26 NOTE — Patient Instructions (Signed)

## 2015-07-26 NOTE — Progress Notes (Signed)
   Subjective:    Patient ID: Lorraine Woods, female    DOB: 1946-02-03, 70 y.o.   MRN: [id number]  HPI Patient says yesterday afternoon she started coughing. By last night she started to feel worse. She started getting a runny nose, sinus drainage, sinus pressure increased cough fevers bodyaches and chills. Around someone at work he was out with the flu. No GI symptoms. She did take some ibuprofen for the fever. Highest temperature was 101.   Review of Systems     Objective:   Physical Exam  Constitutional: She is oriented to person, place, and time. She appears well-developed and well-nourished.  HENT:  Head: Normocephalic and atraumatic.  Right Ear: External ear normal.  Left Ear: External ear normal.  Nose: Nose normal.  Mouth/Throat: Oropharynx is clear and moist.  TMs and canals are clear.   Eyes: Conjunctivae and EOM are normal. Pupils are equal, round, and reactive to light.  Neck: Neck supple. No thyromegaly present.  Cardiovascular: Normal rate, regular rhythm and normal heart sounds.   Pulmonary/Chest: Effort normal and breath sounds normal. She has no wheezes.  Lymphadenopathy:    She has no cervical adenopathy.  Neurological: She is alert and oriented to person, place, and time.  Skin: Skin is warm and dry.  Psychiatric: She has a normal mood and affect.          Assessment & Plan:  Influenza A-Tamiflu prescribed. Recommend sent Medicare. Work note given for today and tomorrow. She needs to be fever free for at least 24 hours before returning to work. Call if not improving or if not better by the end of the week. Make sure stay well-hydrated.

## 2015-08-25 ENCOUNTER — Ambulatory Visit (INDEPENDENT_AMBULATORY_CARE_PROVIDER_SITE_OTHER): Payer: Commercial Managed Care - PPO | Admitting: Family Medicine

## 2015-08-25 ENCOUNTER — Encounter: Payer: Self-pay | Admitting: Family Medicine

## 2015-08-25 VITALS — BP 127/73 | HR 80 | Wt 189.0 lb

## 2015-08-25 DIAGNOSIS — M722 Plantar fascial fibromatosis: Secondary | ICD-10-CM

## 2015-08-25 DIAGNOSIS — M2242 Chondromalacia patellae, left knee: Secondary | ICD-10-CM | POA: Diagnosis not present

## 2015-08-25 NOTE — Progress Notes (Signed)
   Subjective:    I'm seeing this patient as a consultation for:  Dr. Madilyn Fireman  CC: Foot pain  HPI: Right foot pain. Patient is a 3 week history of right plantar calcaneal foot pain. Pain is worse with activity and better with rest. She denies any specific injury. She's tried using some over-the-counter orthotics have not helped much.  Additionally she notes continued left anterior knee pain. She's had an injection in the past that helped some. She notes a crunching and popping and pain with climbing stairs. Otherwise she is essentially pain free. She denies significant swelling. She denies any significant injury. She's been told that she has arthritis under her kneecap.  Past medical history, Surgical history, Family history not pertinant except as noted below, Social history, Allergies, and medications have been entered into the medical record, reviewed, and no changes needed.   Review of Systems: No headache, visual changes, nausea, vomiting, diarrhea, constipation, dizziness, abdominal pain, skin rash, fevers, chills, night sweats, weight loss, swollen lymph nodes, body aches, joint swelling, muscle aches, chest pain, shortness of breath, mood changes, visual or auditory hallucinations.   Objective:    Filed Vitals:   08/25/15 0903  BP: 127/73  Pulse: 80   General: Well Developed, well nourished, and in no acute distress.  Neuro/Psych: Alert and oriented x3, extra-ocular muscles intact, able to move all 4 extremities, sensation grossly intact. Skin: Warm and dry, no rashes noted.  Respiratory: Not using accessory muscles, speaking in full sentences, trachea midline.  Cardiovascular: Pulses palpable, no extremity edema. Abdomen: Does not appear distended. MSK: Right foot is relatively normal-appearing Tender palpation plantar calcaneus. Foot and ankle with normal motion otherwise nontender. Pulses capillary refill sensation intact. Left knee normal-appearing nontender range of  motion 0-120 with 2+ retropatellar crepitations on extension. Nontender stable ligaments exam negative McMurray's testing negative anterior posterior drawer and valgus varus stress.  No results found for this or any previous visit (from the past 24 hour(s)). No results found.  Impression and Recommendations:   This case required medical decision making of moderate complexity.

## 2015-08-25 NOTE — Assessment & Plan Note (Signed)
Right foot. Treat with gel heel cups eccentric exercises and ice massage. Recheck in one month. Consider orthotics at that time.

## 2015-08-25 NOTE — Assessment & Plan Note (Signed)
Plan for straight leg raise exercises. Discussed injections. Return as needed for injection.

## 2015-08-25 NOTE — Patient Instructions (Signed)
Thank you for coming in today. Do the heel exercises and ice massage for plantar fasciitis.  Return in 1 month or so. Schedule a 30 minute visit for orthotics if you want orthotics.   Plantar Fasciitis With Rehab The plantar fascia is a fibrous, ligament-like, soft-tissue structure that spans the bottom of the foot. Plantar fasciitis, also called heel spur syndrome, is a condition that causes pain in the foot due to inflammation of the tissue. SYMPTOMS   Pain and tenderness on the underneath side of the foot.  Pain that worsens with standing or walking. CAUSES  Plantar fasciitis is caused by irritation and injury to the plantar fascia on the underneath side of the foot. Common mechanisms of injury include:  Direct trauma to bottom of the foot.  Damage to a small nerve that runs under the foot where the main fascia attaches to the heel bone.  Stress placed on the plantar fascia due to bone spurs. RISK INCREASES WITH:   Activities that place stress on the plantar fascia (running, jumping, pivoting, or cutting).  Poor strength and flexibility.  Improperly fitted shoes.  Tight calf muscles.  Flat feet.  Failure to warm-up properly before activity.  Obesity. PREVENTION  Warm up and stretch properly before activity.  Allow for adequate recovery between workouts.  Maintain physical fitness:  Strength, flexibility, and endurance.  Cardiovascular fitness.  Maintain a health body weight.  Avoid stress on the plantar fascia.  Wear properly fitted shoes, including arch supports for individuals who have flat feet. PROGNOSIS  If treated properly, then the symptoms of plantar fasciitis usually resolve without surgery. However, occasionally surgery is necessary. RELATED COMPLICATIONS   Recurrent symptoms that may result in a chronic condition.  Problems of the lower back that are caused by compensating for the injury, such as limping.  Pain or weakness of the foot during  push-off following surgery.  Chronic inflammation, scarring, and partial or complete fascia tear, occurring more often from repeated injections. TREATMENT  Treatment initially involves the use of ice and medication to help reduce pain and inflammation. The use of strengthening and stretching exercises may help reduce pain with activity, especially stretches of the Achilles tendon. These exercises may be performed at home or with a therapist. Your caregiver may recommend that you use heel cups of arch supports to help reduce stress on the plantar fascia. Occasionally, corticosteroid injections are given to reduce inflammation. If symptoms persist for greater than 6 months despite non-surgical (conservative), then surgery may be recommended.  MEDICATION   If pain medication is necessary, then nonsteroidal anti-inflammatory medications, such as aspirin and ibuprofen, or other minor pain relievers, such as acetaminophen, are often recommended.  Do not take pain medication within 7 days before surgery.  Prescription pain relievers may be given if deemed necessary by your caregiver. Use only as directed and only as much as you need.  Corticosteroid injections may be given by your caregiver. These injections should be reserved for the most serious cases, because they may only be given a certain number of times. HEAT AND COLD  Cold treatment (icing) relieves pain and reduces inflammation. Cold treatment should be applied for 10 to 15 minutes every 2 to 3 hours for inflammation and pain and immediately after any activity that aggravates your symptoms. Use ice packs or massage the area with a piece of ice (ice massage).  Heat treatment may be used prior to performing the stretching and strengthening activities prescribed by your caregiver, physical therapist, or  Product/process development scientist. Use a heat pack or soak the injury in warm water. SEEK IMMEDIATE MEDICAL CARE IF:  Treatment seems to offer no benefit, or the  condition worsens.  Any medications produce adverse side effects. EXERCISES RANGE OF MOTION (ROM) AND STRETCHING EXERCISES - Plantar Fasciitis (Heel Spur Syndrome) These exercises may help you when beginning to rehabilitate your injury. Your symptoms may resolve with or without further involvement from your physician, physical therapist or athletic trainer. While completing these exercises, remember:   Restoring tissue flexibility helps normal motion to return to the joints. This allows healthier, less painful movement and activity.  An effective stretch should be held for at least 30 seconds.  A stretch should never be painful. You should only feel a gentle lengthening or release in the stretched tissue. RANGE OF MOTION - Toe Extension, Flexion  Sit with your right / left leg crossed over your opposite knee.  Grasp your toes and gently pull them back toward the top of your foot. You should feel a stretch on the bottom of your toes and/or foot.  Hold this stretch for __________ seconds.  Now, gently pull your toes toward the bottom of your foot. You should feel a stretch on the top of your toes and or foot.  Hold this stretch for __________ seconds. Repeat __________ times. Complete this stretch __________ times per day.  RANGE OF MOTION - Ankle Dorsiflexion, Active Assisted  Remove shoes and sit on a chair that is preferably not on a carpeted surface.  Place right / left foot under knee. Extend your opposite leg for support.  Keeping your heel down, slide your right / left foot back toward the chair until you feel a stretch at your ankle or calf. If you do not feel a stretch, slide your bottom forward to the edge of the chair, while still keeping your heel down.  Hold this stretch for __________ seconds. Repeat __________ times. Complete this stretch __________ times per day.  STRETCH - Gastroc, Standing  Place hands on wall.  Extend right / left leg, keeping the front knee  somewhat bent.  Slightly point your toes inward on your back foot.  Keeping your right / left heel on the floor and your knee straight, shift your weight toward the wall, not allowing your back to arch.  You should feel a gentle stretch in the right / left calf. Hold this position for __________ seconds. Repeat __________ times. Complete this stretch __________ times per day. STRETCH - Soleus, Standing  Place hands on wall.  Extend right / left leg, keeping the other knee somewhat bent.  Slightly point your toes inward on your back foot.  Keep your right / left heel on the floor, bend your back knee, and slightly shift your weight over the back leg so that you feel a gentle stretch deep in your back calf.  Hold this position for __________ seconds. Repeat __________ times. Complete this stretch __________ times per day. STRETCH - Gastrocsoleus, Standing  Note: This exercise can place a lot of stress on your foot and ankle. Please complete this exercise only if specifically instructed by your caregiver.   Place the ball of your right / left foot on a step, keeping your other foot firmly on the same step.  Hold on to the wall or a rail for balance.  Slowly lift your other foot, allowing your body weight to press your heel down over the edge of the step.  You should feel a  stretch in your right / left calf.  Hold this position for __________ seconds.  Repeat this exercise with a slight bend in your right / left knee. Repeat __________ times. Complete this stretch __________ times per day.  STRENGTHENING EXERCISES - Plantar Fasciitis (Heel Spur Syndrome)  These exercises may help you when beginning to rehabilitate your injury. They may resolve your symptoms with or without further involvement from your physician, physical therapist or athletic trainer. While completing these exercises, remember:   Muscles can gain both the endurance and the strength needed for everyday activities  through controlled exercises.  Complete these exercises as instructed by your physician, physical therapist or athletic trainer. Progress the resistance and repetitions only as guided. STRENGTH - Towel Curls  Sit in a chair positioned on a non-carpeted surface.  Place your foot on a towel, keeping your heel on the floor.  Pull the towel toward your heel by only curling your toes. Keep your heel on the floor.  If instructed by your physician, physical therapist or athletic trainer, add ____________________ at the end of the towel. Repeat __________ times. Complete this exercise __________ times per day. STRENGTH - Ankle Inversion  Secure one end of a rubber exercise band/tubing to a fixed object (table, pole). Loop the other end around your foot just before your toes.  Place your fists between your knees. This will focus your strengthening at your ankle.  Slowly, pull your big toe up and in, making sure the band/tubing is positioned to resist the entire motion.  Hold this position for __________ seconds.  Have your muscles resist the band/tubing as it slowly pulls your foot back to the starting position. Repeat __________ times. Complete this exercises __________ times per day.    This information is not intended to replace advice given to you by your health care provider. Make sure you discuss any questions you have with your health care provider.   Document Released: 05/21/2005 Document Revised: 10/05/2014 Document Reviewed: 09/02/2008 Elsevier Interactive Patient Education [employer].

## 2015-09-26 ENCOUNTER — Other Ambulatory Visit: Payer: Self-pay | Admitting: Family Medicine

## 2015-10-18 ENCOUNTER — Ambulatory Visit: Payer: Commercial Managed Care - PPO | Admitting: Family Medicine

## 2015-10-20 ENCOUNTER — Encounter: Payer: Self-pay | Admitting: Family Medicine

## 2015-10-20 ENCOUNTER — Ambulatory Visit (INDEPENDENT_AMBULATORY_CARE_PROVIDER_SITE_OTHER): Payer: Commercial Managed Care - PPO | Admitting: Family Medicine

## 2015-10-20 VITALS — BP 119/63 | HR 67 | Wt 191.0 lb

## 2015-10-20 DIAGNOSIS — M25562 Pain in left knee: Secondary | ICD-10-CM

## 2015-10-20 DIAGNOSIS — Z23 Encounter for immunization: Secondary | ICD-10-CM

## 2015-10-20 DIAGNOSIS — F4010 Social phobia, unspecified: Secondary | ICD-10-CM | POA: Diagnosis not present

## 2015-10-20 MED ORDER — DICLOFENAC SODIUM 2 % TD SOLN: 1.0000 | Freq: Two times a day (BID) | TRANSDERMAL | Status: DC | PRN

## 2015-10-20 NOTE — Patient Instructions (Signed)

## 2015-10-20 NOTE — Progress Notes (Signed)
   Subjective:    Patient ID: Lorraine Woods, female    DOB: 12-Mar-1946, 70 y.o.   MRN: [id number]  HPI Follow-up social anxiety disorder-she's currently on Zoloft 100 mg daily. She is happy with her current regimen and denies any excess or increase in symptoms recently. She is retiring this summer and she says she is a little bit nervous about that. She is also a little bit concerned about her current political situation but those are her 2 main stressors right now. Next  She also complains of left knee pain. It's been bothering her on and off for last couple of years. In fact she had an injection done last summer because she was having hard time going up and down stairs and now it's starting to bother her again. She is using Voltaren gel in the past for her plantar fasciitis in 1 to it would work for her knee as well. She's not the point of wanting an injection would like something additional for pain relief.   Review of Systems     Objective:   Physical Exam  Constitutional: She is oriented to person, place, and time. She appears well-developed and well-nourished.  HENT:  Head: Normocephalic and atraumatic.  Cardiovascular: Normal rate, regular rhythm and normal heart sounds.   Pulmonary/Chest: Effort normal and breath sounds normal.  Neurological: She is alert and oriented to person, place, and time.  Skin: Skin is warm and dry.  Psychiatric: She has a normal mood and affect. Her behavior is normal.        Assessment & Plan:  Social anxiety disorder-GAD 7 score of 1. She rates her symptoms as not difficult. Continue current regimen. Next  Left knee pain secondary to osteoarthritis - will try Pensaid. Prescription sent to mail order pharmacy. Call if not helping and will be happy to get her in with one of our sports medicine providers for a knee injection  Pneumovax 23 updated today.

## 2015-12-20 ENCOUNTER — Encounter: Payer: Self-pay | Admitting: Family Medicine

## 2015-12-21 ENCOUNTER — Other Ambulatory Visit: Payer: Self-pay | Admitting: Family Medicine

## 2015-12-21 DIAGNOSIS — L659 Nonscarring hair loss, unspecified: Secondary | ICD-10-CM

## 2015-12-22 LAB — TSH: TSH: 0.91 m[IU]/L

## 2016-04-18 ENCOUNTER — Ambulatory Visit (INDEPENDENT_AMBULATORY_CARE_PROVIDER_SITE_OTHER): Payer: Medicare Other | Admitting: Family Medicine

## 2016-04-18 ENCOUNTER — Encounter: Payer: Self-pay | Admitting: Family Medicine

## 2016-04-18 VITALS — BP 119/51 | HR 69 | Ht 70.0 in | Wt 190.0 lb

## 2016-04-18 DIAGNOSIS — Z13220 Encounter for screening for lipoid disorders: Secondary | ICD-10-CM | POA: Diagnosis not present

## 2016-04-18 DIAGNOSIS — R7309 Other abnormal glucose: Secondary | ICD-10-CM

## 2016-04-18 DIAGNOSIS — Z1231 Encounter for screening mammogram for malignant neoplasm of breast: Secondary | ICD-10-CM

## 2016-04-18 DIAGNOSIS — Z0000 Encounter for general adult medical examination without abnormal findings: Secondary | ICD-10-CM | POA: Diagnosis not present

## 2016-04-18 DIAGNOSIS — N889 Noninflammatory disorder of cervix uteri, unspecified: Secondary | ICD-10-CM | POA: Diagnosis not present

## 2016-04-18 DIAGNOSIS — Z5181 Encounter for therapeutic drug level monitoring: Secondary | ICD-10-CM

## 2016-04-18 DIAGNOSIS — R413 Other amnesia: Secondary | ICD-10-CM

## 2016-04-18 LAB — COMPLETE METABOLIC PANEL WITH GFR
ALK PHOS: 48 U/L (ref 33–130)
ALT: 13 U/L (ref 6–29)
AST: 18 U/L (ref 10–35)
Albumin: 4.3 g/dL (ref 3.6–5.1)
BILIRUBIN TOTAL: 0.5 mg/dL (ref 0.2–1.2)
BUN: 18 mg/dL (ref 7–25)
CO2: 25 mmol/L (ref 20–31)
Calcium: 9.4 mg/dL (ref 8.6–10.4)
Chloride: 107 mmol/L (ref 98–110)
Creat: 0.79 mg/dL (ref 0.50–0.99)
GFR, EST AFRICAN AMERICAN: 88 mL/min (ref >=60)
GFR, EST NON AFRICAN AMERICAN: 77 mL/min (ref >=60)
GLUCOSE: 108 mg/dL — AB (ref 65–99)
POTASSIUM: 4 mmol/L (ref 3.5–5.3)
SODIUM: 143 mmol/L (ref 135–146)
TOTAL PROTEIN: 7.1 g/dL (ref 6.1–8.1)

## 2016-04-18 LAB — LIPID PANEL
CHOL/HDL RATIO: 3.4 ratio (ref <5.0)
Cholesterol: 202 mg/dL — ABNORMAL HIGH (ref <200)
HDL: 59 mg/dL (ref >50)
LDL CALC: 120 mg/dL — AB (ref <100)
TRIGLYCERIDES: 114 mg/dL (ref <150)
VLDL: 23 mg/dL (ref <30)

## 2016-04-18 MED ORDER — SUMATRIPTAN SUCCINATE 100 MG PO TABS: 100.0000 mg | ORAL_TABLET | ORAL | 6 refills | Status: DC | PRN

## 2016-04-18 MED ORDER — IMIQUIMOD 5 % EX CREA: TOPICAL_CREAM | CUTANEOUS | 0 refills | Status: DC

## 2016-04-18 NOTE — Patient Instructions (Signed)
Keep up a regular exercise program and make sure you are eating a healthy diet Try to eat 4 servings of dairy a day, or if you are lactose intolerant take a calcium with vitamin D daily.  Your vaccines are up to date.   

## 2016-04-18 NOTE — Progress Notes (Addendum)
Subjective:   Lorraine Woods is a 70 y.o. female who presents for Medicare Annual (Subsequent) preventive examination.  Review of Systems:  Comprehensive ROS is negative.         Objective:     Vitals: BP (!) 119/51   Pulse 69   Ht 5\' 10"  (1.778 m)   Wt 190 lb (86.2 kg)   SpO2 100%   BMI 27.26 kg/m   Body mass index is 27.26 kg/m.  Physical Exam  Constitutional: She is oriented to person, place, and time. She appears well-developed and well-nourished.  HENT:  Head: Normocephalic and atraumatic.  Right Ear: External ear normal.  Left Ear: External ear normal.  Nose: Nose normal.  Mouth/Throat: Oropharynx is clear and moist.  TMs and canals are clear.   Eyes: Conjunctivae and EOM are normal. Pupils are equal, round, and reactive to light.  Neck: Neck supple. No thyromegaly present.  Cardiovascular: Normal rate, regular rhythm and normal heart sounds.   Pulmonary/Chest: Effort normal and breath sounds normal. She has no wheezes. Right breast exhibits no inverted nipple, no mass and no nipple discharge. Left breast exhibits no inverted nipple, no mass and no nipple discharge.  Abdominal: Soft. Bowel sounds are normal. She exhibits no distension. There is no tenderness. There is no guarding.  Lymphadenopathy:    She has no cervical adenopathy.  Neurological: She is alert and oriented to person, place, and time.  Skin: Skin is warm and dry.  Psychiatric: She has a normal mood and affect. Her behavior is normal.     Tobacco History  Smoking Status  . Never Smoker  Smokeless Tobacco  . Never Used     Counseling given: Not Answered   Past Medical History:  Diagnosis Date  . Anxiety   . Basal cell carcinoma    nose  . Knee pain, chronic   . Liver cyst   . Migraine    possible 1 every 2 months  . Wears glasses    Past Surgical History:  Procedure Laterality Date  . ANKLE FRACTURE SURGERY Left   . APPENDECTOMY  1959  . BREAST EXCISIONAL BIOPSY  2002    noncancerous lump  . BREAST LUMPECTOMY Right    on HRT, fibromadenoma  . CHOLECYSTECTOMY N/A 09/17/2013   Procedure: LAPAROSCOPIC CHOLECYSTECTOMY;  Surgeon: Stark Klein, MD;  Location: [facility];  Service: General;  Laterality: N/A;  . LAPAROSCOPIC LIVER CYST UNROOFING N/A 09/17/2013   Procedure: LAPAROSCOPIC LIVER CYST UNROOFING;  Surgeon: Stark Klein, MD;  Location: [facility];  Service: General;  Laterality: N/A;  . ORIF ANKLE FRACTURE  2000   left  . sclerotherapy on left leg  2013  . SKIN CANCER EXCISION    . SKIN CANCER EXCISION     basil cell lerion on nose  . TUBAL LIGATION  1982  . UPPER ENDOSCOPY W/ SCLEROTHERAPY    . VEIN SURGERY Left    Family History  Problem Relation Age of Onset  . Brain cancer Mother 10  . Diabetes Father   . Hypertension Father   . CAD Father   . Heart disease Father   . Colon polyps Son     Puetz-jehgers syndrome   History  Sexual Activity  . Sexual activity: Yes  . Partners: Male    Outpatient Encounter Prescriptions as of 04/18/2016  Medication Sig  . BIOTIN PO Take 1,000 mcg by mouth every morning.   . calcium gluconate (CALCIUM GLUCONATE) 100 mg/mL SOLN Take 500  mg by mouth daily.  . Cholecalciferol (VITAMIN D3) 2000 UNITS TABS Take 2,000 Units by mouth every morning.   . loratadine (CLARITIN) 10 MG tablet Take 10 mg by mouth daily.  . Multiple Vitamin (MULTIVITAMIN WITH MINERALS) TABS tablet Take 1 tablet by mouth every morning.  . sertraline (ZOLOFT) 100 MG tablet TAKE 1 TABLET (100 MG TOTAL) BY MOUTH EVERY MORNING.  . SUMAtriptan (IMITREX) 100 MG tablet Take 1 tablet (100 mg total) by mouth every 2 (two) hours as needed for migraine. May repeat in 2 hours if headache persists or recurs.  . [DISCONTINUED] SUMAtriptan (IMITREX) 100 MG tablet Take 1 tablet (100 mg total) by mouth every 2 (two) hours as needed for migraine. May repeat in 2 hours if headache persists or recurs.  . imiquimod (ALDARA) 5 % cream Apply 5 times weekly at bedtime for  16 weeks.  Apply just to affected lesion.  . [DISCONTINUED] Diclofenac Sodium 2 % SOLN Place 1 Act onto the skin 2 (two) times daily as needed (to knees as needed).  . [DISCONTINUED] MINOXIDIL, TOPICAL, (MINOXIDIL FOR MEN) 5 % SOLN Apply topically daily.    No facility-administered encounter medications on file as of 04/18/2016.     Activities of Daily Living In your present state of health, do you have any difficulty performing the following activities: 04/18/2016  Hearing? Y  Vision? N  Difficulty concentrating or making decisions? Y  Walking or climbing stairs? N  Dressing or bathing? N  Doing errands, shopping? N  Some recent data might be hidden    Patient Care Team: Hali Marry, MD as PCP - General (Family Medicine)    Assessment:    Welcome to Medicare Wellness Exam   Exercise Activities and Dietary recommendations Current Exercise Habits: The patient does not participate in regular exercise at present  Goals    None     Fall Risk Fall Risk  04/18/2016 04/19/2015 03/23/2014  Falls in the past year? Yes Yes Yes  Number falls in past yr: 1 1 1   Injury with Fall? No Yes -  Risk for fall due to : Other (Comment) - -  Risk for fall due to (comments): L knee - -  Follow up Falls evaluation completed - -   Depression Screen PHQ 2/9 Scores 04/18/2016 04/19/2015 03/23/2014  PHQ - 2 Score 0 0 0     Cognitive Function     6CIT Screen 04/18/2016  What Year? 0 points  What month? 0 points  What time? 0 points  Count back from 20 0 points  Months in reverse 0 points  Repeat phrase 8 points  Total Score 8    Immunization History  Administered Date(s) Administered  . Influenza, Seasonal, Injecte, Preservative Fre 03/05/2014  . Influenza-Unspecified 03/04/2013, 03/05/2015, 03/14/2016  . Pneumococcal Conjugate-13 12/29/2013  . Pneumococcal Polysaccharide-23 06/04/2005, 10/20/2015  . Tdap 03/04/2009  . Zoster 01/03/2012   Screening Tests Health  Maintenance  Topic Date Due  . COLONOSCOPY  06/04/2016  . MAMMOGRAM  07/06/2017  . TETANUS/TDAP  03/05/2019  . INFLUENZA VACCINE  Completed  . DEXA SCAN  Completed  . ZOSTAVAX  Completed  . Hepatitis C Screening  Completed  . PNA vac Low Risk Adult  Completed      Plan:    During the course of the visit the patient was educated and counseled about the following appropriate screening and preventive services:   Vaccines to include Pneumoccal, Influenza, Hepatitis B, Td, Zostavax, HCV  Cardiovascular Disease  Colorectal cancer screening  Bone density screening  Diabetes screening  Glaucoma screening   Mammography/PAP  Memory Impairment-she did screen positive on the 6 CIT that we performed today. Discussed having her come back in next month or so or after the holidays to do a full evaluation including a Mini-Mental status exam and discuss further in detail the memory issues. She said she's been noticing it over about the last 10 months. Particular she's had difficulty remembering names and names of objects. She denies any family history of mentioned or Alzheimer's.  Patient Instructions (the written plan) was given to the patient.   Martise Waddell, MD  04/18/2016

## 2016-04-19 ENCOUNTER — Encounter: Payer: Commercial Managed Care - PPO | Admitting: Family Medicine

## 2016-04-19 LAB — HEMOGLOBIN A1C
HEMOGLOBIN A1C: 5.3 % (ref <5.7)
Mean Plasma Glucose: 105 mg/dL

## 2016-04-19 NOTE — Addendum Note (Signed)
Addended by: Teddy Spike on: 04/19/2016 04:27 PM   Modules accepted: Orders

## 2016-06-02 ENCOUNTER — Other Ambulatory Visit: Payer: Self-pay | Admitting: Family Medicine

## 2016-06-02 DIAGNOSIS — N889 Noninflammatory disorder of cervix uteri, unspecified: Secondary | ICD-10-CM

## 2016-06-04 DIAGNOSIS — M1990 Unspecified osteoarthritis, unspecified site: Secondary | ICD-10-CM

## 2016-06-04 HISTORY — DX: Unspecified osteoarthritis, unspecified site: M19.90

## 2016-06-19 DIAGNOSIS — H2513 Age-related nuclear cataract, bilateral: Secondary | ICD-10-CM | POA: Diagnosis not present

## 2016-07-11 DIAGNOSIS — Z1231 Encounter for screening mammogram for malignant neoplasm of breast: Secondary | ICD-10-CM | POA: Diagnosis not present

## 2016-07-11 LAB — HM MAMMOGRAPHY

## 2016-07-18 ENCOUNTER — Telehealth: Payer: Self-pay | Admitting: Family Medicine

## 2016-07-18 NOTE — Telephone Encounter (Addendum)
I received an email from [employer] with [employer].  Lorraine Woods's Wellness Exam on 04/18/16 was denied through Medicare because it was coded as [phone] (subsequent visit).  Lorraine Woods has not had her Welcome to [employer] Exam yet and [name] wanted to know if you could do an addendum and change the code to [id number] for the Welcome to Medicare Wellness Exam?

## 2016-07-18 NOTE — Telephone Encounter (Signed)
I corrected code.  Didn't realize this is her first.  [name], MD

## 2016-07-18 NOTE — Addendum Note (Signed)
Addended by: [name] D on: 07/18/2016 02:28 PM   Modules accepted: Level of Service

## 2016-07-20 ENCOUNTER — Encounter: Payer: Self-pay | Admitting: Family Medicine

## 2016-08-07 ENCOUNTER — Other Ambulatory Visit: Payer: Self-pay | Admitting: Family Medicine

## 2016-08-10 ENCOUNTER — Other Ambulatory Visit: Payer: Self-pay | Admitting: Family Medicine

## 2016-10-15 ENCOUNTER — Ambulatory Visit (INDEPENDENT_AMBULATORY_CARE_PROVIDER_SITE_OTHER): Payer: Medicare Other

## 2016-10-15 ENCOUNTER — Ambulatory Visit (INDEPENDENT_AMBULATORY_CARE_PROVIDER_SITE_OTHER): Payer: Medicare Other | Admitting: Physician Assistant

## 2016-10-15 ENCOUNTER — Encounter: Payer: Medicare Other | Admitting: Family Medicine

## 2016-10-15 ENCOUNTER — Ambulatory Visit (INDEPENDENT_AMBULATORY_CARE_PROVIDER_SITE_OTHER): Payer: Medicare Other | Admitting: Family Medicine

## 2016-10-15 VITALS — BP 117/72 | HR 76 | Wt 197.0 lb

## 2016-10-15 DIAGNOSIS — W19XXXA Unspecified fall, initial encounter: Secondary | ICD-10-CM

## 2016-10-15 DIAGNOSIS — S52571A Other intraarticular fracture of lower end of right radius, initial encounter for closed fracture: Secondary | ICD-10-CM

## 2016-10-15 DIAGNOSIS — S7011XA Contusion of right thigh, initial encounter: Secondary | ICD-10-CM

## 2016-10-15 DIAGNOSIS — S59201A Unspecified physeal fracture of lower end of radius, right arm, initial encounter for closed fracture: Secondary | ICD-10-CM

## 2016-10-15 NOTE — Progress Notes (Signed)
HPI:                                                                Lorraine Woods is a 71 y.o. female who presents to [facility] today for fall / right wrist pain  Patient reports 2 weeks ago she was in parking garage and fell from standing on an outstretched right hand and landed on her right hip. She did not hit her head or lose consciousness. She is not on any blood thinners. She did not seek medical attention and she went on vacation in [place] for 2 weeks. She presents today with right wrist pain and swelling and bruising of her right hip/thigh.    Fall  The accident occurred more than 1 week ago. The fall occurred while standing. Distance fallen: standing. She landed on concrete. The point of impact was the right hip and right wrist. The pain is present in the right lower arm and right upper leg. The pain is moderate. The symptoms are aggravated by pressure on injury. Pertinent negatives include no numbness or tingling. She has tried NSAID for the symptoms.     Past Medical History:  Diagnosis Date  . Anxiety   . Basal cell carcinoma    nose  . Knee pain, chronic   . Liver cyst   . Migraine    possible 1 every 2 months  . Wears glasses    Past Surgical History:  Procedure Laterality Date  . ANKLE FRACTURE SURGERY Left   . APPENDECTOMY  1959  . BREAST EXCISIONAL BIOPSY  2002   noncancerous lump  . BREAST LUMPECTOMY Right    on HRT, fibromadenoma  . CHOLECYSTECTOMY N/A 09/17/2013   Procedure: LAPAROSCOPIC CHOLECYSTECTOMY;  Surgeon: Stark Klein, MD;  Location: [facility];  Service: General;  Laterality: N/A;  . LAPAROSCOPIC LIVER CYST UNROOFING N/A 09/17/2013   Procedure: LAPAROSCOPIC LIVER CYST UNROOFING;  Surgeon: Stark Klein, MD;  Location: [facility];  Service: General;  Laterality: N/A;  . ORIF ANKLE FRACTURE  2000   left  . sclerotherapy on left leg  2013  . SKIN CANCER EXCISION    . SKIN CANCER EXCISION     basil cell  lerion on nose  . TUBAL LIGATION  1982  . UPPER ENDOSCOPY W/ SCLEROTHERAPY    . VEIN SURGERY Left    Social History  Substance Use Topics  . Smoking status: Never Smoker  . Smokeless tobacco: Never Used  . Alcohol use 0.0 oz/week     Comment: occasional wine -once monthly   family history includes Brain cancer (age of onset: 29) in her mother; CAD in her father; Colon polyps in her son; Diabetes in her father; Heart disease in her father; Hypertension in her father.  ROS: negative except as noted in the HPI  Medications: Current Outpatient Prescriptions  Medication Sig Dispense Refill  . BIOTIN PO Take 1,000 mcg by mouth every morning.     . calcium gluconate (CALCIUM GLUCONATE) 100 mg/mL SOLN Take 500 mg by mouth daily.    . Cholecalciferol (VITAMIN D3) 2000 UNITS TABS Take 2,000 Units by mouth every morning.     . imiquimod (ALDARA) 5 % cream Apply 5 times weekly at bedtime for 16  weeks.  Apply just to affected lesion. 12 each 0  . loratadine (CLARITIN) 10 MG tablet Take 10 mg by mouth daily.    . Multiple Vitamin (MULTIVITAMIN WITH MINERALS) TABS tablet Take 1 tablet by mouth every morning.    . sertraline (ZOLOFT) 100 MG tablet TAKE 1 TABLET BY MOUTH EVERY MORNING 90 tablet 0  . SUMAtriptan (IMITREX) 100 MG tablet Take 1 tablet (100 mg total) by mouth every 2 (two) hours as needed for migraine. May repeat in 2 hours if headache persists or recurs. 9 tablet 6   No current facility-administered medications for this visit.    Allergies  Allergen Reactions  . Aspirin Other (See Comments)    Bruising.   . Excedrin Migraine [Aspirin-Acetaminophen-Caffeine] Other (See Comments)    Bruising        Objective:  BP 117/72   Pulse 76   Wt 197 lb (89.4 kg)   BMI 28.27 kg/m  Gen: well-groomed, cooperative, not ill-appearing, no distress Pulm: Normal work of breathing, normal phonation CV:  radial pulses 2+ symmetric Neuro: sensation grossly intact MSK: right wrist visibly  swollen, bony tenderness of the distal radius, ROM slightly limited with wrist extension, strength 4/4 Skin: large ecchymosis of posterior and lateral right thigh with a firm, tender approx. 5cm hematoma; brisk capillary refill of the right 2nd phalanx    No results found for this or any previous visit (from the past 72 hour(s)). No results found.    Assessment and Plan: 71 y.o. female with   1. Other closed intra-articular fracture of distal end of right radius, initial encounter - DG Wrist Complete Right showed a displaced, angulated interarticular distal radius fracture; wrist is neurovascularly intact on exam - consulted Sports Medicine, Dr. Georgina Snell, who presented patient with treatment options: 1. Casting in office today with possibility of limited ROM of wrist and poorer outcome 2. Consult with Copy. Patient opted to consult with a surgeon. Since she works in [facility] [employer], patient will be referred to [facility]  2. Hematoma of right thigh, initial encounter - self- massage, compression w/thigh sleeve   Patient education and anticipatory guidance given Patient agrees with treatment plan Follow-up with Sports Medicine in 2 weeks or sooner as needed if symptoms worsen or fail to improve  Darlyne Russian PA-C

## 2016-10-15 NOTE — Patient Instructions (Addendum)
Thank you for coming in today. Use the brace expect when bathing.  Get a thigh sleeve.  Recheck in 2 weeks.  You should hear from hand surgery in a few days.  Let me know if you do not hear anything.  Return sooner if needed.    Wrist Fracture Treated With ORIF, Care After Refer to this sheet in the next few weeks. These instructions provide you with information about caring for yourself after your procedure. Your health care provider may also give you more specific instructions. Your treatment has been planned according to current medical practices, but problems sometimes occur. Call your health care provider if you have any problems or questions after your procedure. What can I expect after the procedure? After the procedure, it is common to have:  Pain.  Swelling.  Stiffness. Follow these instructions at home: If you have a splint:   Wear the splint as told by your health care provider. Remove it only as told by your health care provider.  Loosen the splint if your fingers tingle, become numb, or turn cold and blue.  Do not let your splint get wet if it is not waterproof.  Keep the splint clean. If you have a sling:   Wear it as told by your health care provider. Remove it only as told by your health care provider. If you have a cast:   Do not stick anything inside the cast to scratch your skin. Doing that increases your risk of infection.  Check the skin around the cast every day. Report any concerns to your health care provider. You may put lotion on dry skin around the edges of the cast. Do not apply lotion to the skin underneath the cast.  Do not let your cast get wet if it is not waterproof.  Keep the cast clean. Bathing   Do not take baths, swim, or use a hot tub until your health care provider approves. Ask your health care provider if you can take showers. You may only be allowed to take sponge baths for bathing.  If your cast or splint is not waterproof, cover  it with a watertight plastic bag when you take a bath or a shower.  Keep the bandage (dressing) dry until your health care provider says it can be removed.  If you have a sling, remove it for bathing only if your health care provider tells you that it is safe to do that. Incision care   Follow instructions from your health care provider about how to take care of your incision. Make sure you:  Wash your hands with soap and water before you change your bandage (dressing). If soap and water are not available, use hand sanitizer.  Change your dressing as told by your health care provider.  Leave stitches (sutures), skin glue, or adhesive strips in place. These skin closures may need to stay in place for 2 weeks or longer. If adhesive strip edges start to loosen and curl up, you may trim the loose edges. Do not remove adhesive strips completely unless your health care provider tells you to do that.  Check your incision area every day for signs of infection. Check for:  More redness, swelling, or pain.  More fluid or blood.  Warmth.  Pus or a bad smell. Managing pain, stiffness, and swelling   If directed, apply ice to the injured area.  Put ice in a plastic bag.  Place a towel between your skin and the bag.  Leave the ice on for 20 minutes, 2-3 times a day.  Move your fingers often to avoid stiffness and to lessen swelling.  Raise (elevate) the injured area above the level of your heart while you are sitting or lying down. Driving   Do not drive or operate heavy machinery while taking prescription pain medicine.  Do not drive for 24 hours if you received a sedative.  Ask your health care provider when it is safe to drive if you have a cast, splint, or sling on your wrist. Activity   Return to your normal activities as told by your health care provider. Ask your health care provider what activities are safe for you.  Do exercises as told by your health care  provider. General instructions   Do not put pressure on any part of the cast or splint until it is fully hardened. This may take several hours.  Do not use any tobacco products, such as cigarettes, chewing tobacco, and e-cigarettes. Tobacco can delay bone healing. If you need help quitting, ask your health care provider.  Take over-the-counter and prescription medicines only as told by your health care provider.  Keep all follow-up visits as told by your health care provider. This is important. Contact a health care provider if:  Your cast, splint, or sling is damaged or loose.  Your pain is not controlled with medicine.  You develop a rash.  You have more redness, swelling, or pain around your incision.  Your incision feels warm to the touch.  You have more fluid or blood coming from incision.  You have a fever. Get help right away if:  Your skin or fingers on your injured arm turn blue or gray.  Your arm feels cold or numb.  You have severe pain in your injured wrist.  You have pus or a bad smell coming from your incision.  You have trouble breathing.  You feel faint or light-headed. This information is not intended to replace advice given to you by your health care provider. Make sure you discuss any questions you have with your health care provider. Document Released: 05/02/2015 Document Revised: 10/27/2015 Document Reviewed: 02/02/2015 Elsevier Interactive Patient Education  2017 [employer]   Hematoma A hematoma is a collection of blood. The collection of blood can turn into a hard, painful lump under the skin. Your skin may turn blue or yellow if the hematoma is close to the surface of the skin. Most hematomas get better in a few days to weeks. Some hematomas are serious and need medical care. Hematomas can be very small or very big. Follow these instructions at home:  Apply ice to the injured area:  Put ice in a plastic bag.  Place a towel between your  skin and the bag.  Leave the ice on for 20 minutes, 2-3 times a day for the first 1 to 2 days.  After the first 2 days, switch to using warm packs on the injured area.  Raise (elevate) the injured area to lessen pain and puffiness (swelling). You may also wrap the area with an elastic bandage. Make sure the bandage is not wrapped too tight.  If you have a painful hematoma on your leg or foot, you may use crutches for a couple days.  Only take medicines as told by your doctor. Get help right away if:  Your pain gets worse.  Your pain is not controlled with medicine.  You have a fever.  Your puffiness gets worse.  Your skin turns more blue or yellow.  Your skin over the hematoma breaks or starts bleeding.  Your hematoma is in your chest or belly (abdomen) and you are short of breath, feel weak, or have a change in consciousness.  Your hematoma is on your scalp and you have a headache that gets worse or a change in alertness or consciousness. This information is not intended to replace advice given to you by your health care provider. Make sure you discuss any questions you have with your health care provider. Document Released: 06/28/2004 Document Revised: 10/27/2015 Document Reviewed: 10/29/2012 Elsevier Interactive Patient Education  2017 [employer].

## 2016-10-15 NOTE — Progress Notes (Signed)
Lorraine Woods is a 71 y.o. female who presents to [facility] today for right wrist fracture and right thigh hematoma.  Patient fell a few weeks ago injuring her right wrist and right thigh. She braced herself as she had a trip that she did not want to mess. She returned to clinic today for evaluation. Her x-ray of her right wrist shows a displaced angulated interarticular distal radius fracture. She's been using a thumb spica splint and notes pain reasonably well controlled. She denies any numbness or tingling or weakness distally. She is right-hand dominant and works with her hands for a living.  Additionally she notes her right thigh bruise. She fell landed on her right thigh and notes a large hematoma that somewhat soft in the middle. She is able to walk without significant pain. She's been taking ibuprofen which does help both her wrist and her thigh pain.   Past Medical History:  Diagnosis Date  . Anxiety   . Basal cell carcinoma    nose  . Knee pain, chronic   . Liver cyst   . Migraine    possible 1 every 2 months  . Wears glasses    Past Surgical History:  Procedure Laterality Date  . ANKLE FRACTURE SURGERY Left   . APPENDECTOMY  1959  . BREAST EXCISIONAL BIOPSY  2002   noncancerous lump  . BREAST LUMPECTOMY Right    on HRT, fibromadenoma  . CHOLECYSTECTOMY N/A 09/17/2013   Procedure: LAPAROSCOPIC CHOLECYSTECTOMY;  Surgeon: Stark Klein, MD;  Location: [facility];  Service: General;  Laterality: N/A;  . LAPAROSCOPIC LIVER CYST UNROOFING N/A 09/17/2013   Procedure: LAPAROSCOPIC LIVER CYST UNROOFING;  Surgeon: Stark Klein, MD;  Location: [facility];  Service: General;  Laterality: N/A;  . ORIF ANKLE FRACTURE  2000   left  . sclerotherapy on left leg  2013  . SKIN CANCER EXCISION    . SKIN CANCER EXCISION     basil cell lerion on nose  . TUBAL LIGATION  1982  . UPPER ENDOSCOPY W/ SCLEROTHERAPY    . VEIN SURGERY Left    Social  History  Substance Use Topics  . Smoking status: Never Smoker  . Smokeless tobacco: Never Used  . Alcohol use 0.0 oz/week     Comment: occasional wine -once monthly   family history includes Brain cancer (age of onset: 31) in her mother; CAD in her father; Colon polyps in her son; Diabetes in her father; Heart disease in her father; Hypertension in her father.  ROS:  No headache, visual changes, nausea, vomiting, diarrhea, constipation, dizziness, abdominal pain, skin rash, fevers, chills, night sweats, weight loss, swollen lymph nodes, body aches, joint swelling, muscle aches, chest pain, shortness of breath, mood changes, visual or auditory hallucinations.    Medications: Current Outpatient Prescriptions  Medication Sig Dispense Refill  . BIOTIN PO Take 1,000 mcg by mouth every morning.     . calcium gluconate (CALCIUM GLUCONATE) 100 mg/mL SOLN Take 500 mg by mouth daily.    . Cholecalciferol (VITAMIN D3) 2000 UNITS TABS Take 2,000 Units by mouth every morning.     . imiquimod (ALDARA) 5 % cream Apply 5 times weekly at bedtime for 16 weeks.  Apply just to affected lesion. 12 each 0  . loratadine (CLARITIN) 10 MG tablet Take 10 mg by mouth daily.    . Multiple Vitamin (MULTIVITAMIN WITH MINERALS) TABS tablet Take 1 tablet by mouth every morning.    . sertraline (  ZOLOFT) 100 MG tablet TAKE 1 TABLET BY MOUTH EVERY MORNING 90 tablet 0  . SUMAtriptan (IMITREX) 100 MG tablet Take 1 tablet (100 mg total) by mouth every 2 (two) hours as needed for migraine. May repeat in 2 hours if headache persists or recurs. 9 tablet 6   No current facility-administered medications for this visit.    Allergies  Allergen Reactions  . Aspirin Other (See Comments)    Bruising.   . Excedrin Migraine [Aspirin-Acetaminophen-Caffeine] Other (See Comments)    Bruising      Exam:  There were no vitals taken for this visit. General: Well Developed, well nourished, and in no acute distress.  Neuro/Psych:  Alert and oriented x3, extra-ocular muscles intact, able to move all 4 extremities, sensation grossly intact. Skin: Warm and dry, no rashes noted.  Respiratory: Not using accessory muscles, speaking in full sentences, trachea midline.  Cardiovascular: Pulses palpable, no extremity edema. Abdomen: Does not appear distended. MSK:  Right wrist no obvious deformity. Tender to palpation pulses Refill and sensation intact distally. Hand strength is intact.  Right thigh large hematoma with soft center about the size of a tangerine at the lateral thigh. Not particularly tender. Large ecchymosis distally.    No results found for this or any previous visit (from the past 48 hour(s)). Dg Wrist Complete Right  Result Date: 10/15/2016 CLINICAL DATA:  Status post fall 2 weeks ago with persistent right wrist pain and swelling. EXAM: RIGHT WRIST - COMPLETE 3+ VIEW COMPARISON:  None in PACs FINDINGS: The bones are subjectively adequately mineralized. There is a comminuted, impacted, intra-articular fracture of the distal right radial metaphysis. The ulnar styloid is intact. The carpal bones exhibit no acute fracture. There is moderate degenerative change of the first [facility] joint. The metacarpals appear intact. IMPRESSION: There is an acute comminuted, impacted, and mildly distracted intra-articular fracture of the distal right radial metaphysis. The adjacent ulna is intact. No bony bridging or periosteal reaction is observed. Moderate osteoarthritic change of the first [facility] joint. Electronically Signed   By: David  Martinique M.D.   On: 10/15/2016 12:30      Assessment and Plan: 71 y.o. female with  Right distal radius fracture. This is displaced comminuted and intra-articular. This is her dominant hand and she is still working. I think she probably be best served with consultation with a hand surgeon for potential ORIF. We offered her the possibility of attempted reduction and casting here although as it's been over  2 weeks since her fracture and not optimistic about being able to do that easily.  Plan to refer to hand surgery. As she is doing pretty well in the splint she has she will continue the thumb spica splint.  Right thigh hematoma: Likely liquefied hematoma. This is a bit large but still optimistic about resolution without invasive methods. Plan to recheck in 2 weeks after treated with compression. If no better with compression will proceed with ultrasound-guided aspiration of hematoma.    Orders Placed This Encounter  Procedures  . Ambulatory referral to Hand Surgery    Referral Priority:   Routine    Referral Type:   Surgical    Referral Reason:   Specialty Services Required    Requested Specialty:   Hand Surgery    Number of Visits Requested:   1    Discussed warning signs or symptoms. Please see discharge instructions. Patient expresses understanding.

## 2016-10-16 ENCOUNTER — Ambulatory Visit: Payer: Medicare Other | Admitting: Physician Assistant

## 2016-10-17 ENCOUNTER — Encounter: Payer: Self-pay | Admitting: Family Medicine

## 2016-10-23 ENCOUNTER — Encounter: Payer: Self-pay | Admitting: Family Medicine

## 2016-10-23 ENCOUNTER — Ambulatory Visit (INDEPENDENT_AMBULATORY_CARE_PROVIDER_SITE_OTHER): Payer: Medicare Other | Admitting: Family Medicine

## 2016-10-23 VITALS — BP 126/65 | HR 74 | Ht 70.0 in | Wt 194.0 lb

## 2016-10-23 DIAGNOSIS — S7011XA Contusion of right thigh, initial encounter: Secondary | ICD-10-CM

## 2016-10-23 DIAGNOSIS — F4010 Social phobia, unspecified: Secondary | ICD-10-CM

## 2016-10-23 DIAGNOSIS — S52571A Other intraarticular fracture of lower end of right radius, initial encounter for closed fracture: Secondary | ICD-10-CM

## 2016-10-23 MED ORDER — SERTRALINE HCL 100 MG PO TABS: 100.0000 mg | ORAL_TABLET | Freq: Every morning | ORAL | 1 refills | Status: DC

## 2016-10-23 NOTE — Progress Notes (Addendum)
   Subjective:    Patient ID: Lorraine Woods, female    DOB: [birth year], 71 y.o.   MRN: [id number]  HPI 6 mo f/u for social anxiety disorder - She is currently on sertraline 100 mg daily. She is doing very well. She is very happy with her regimen and would like to continue the medication. She said right after she retired she did feel a little down and depressed but now has a part-time job is doing well.  She is now dealing with a fracture in her right hand. She was finally able to get an appointment with orthopedist on Thursday she might end up needing surgical repair.  She also has some questions about the hematoma on her right thigh. Dr. Georgina Snell had recommended a sleeve for compression but unfortunately it was causing a lot of discomfort on the inner thigh crease because the hematoma is up to high on her hip. So she hasn't been able to wear it.   Review of Systems     Objective:   Physical Exam  Constitutional: She is oriented to person, place, and time. She appears well-developed and well-nourished.  HENT:  Head: Normocephalic and atraumatic.  Cardiovascular: Normal rate, regular rhythm and normal heart sounds.   Pulmonary/Chest: Effort normal and breath sounds normal.  Neurological: She is alert and oriented to person, place, and time.  Skin: Skin is warm and dry.  Psychiatric: She has a normal mood and affect. Her behavior is normal.        Assessment & Plan:  Social anxiety d/o - Doing well on Zoloft. GAD 7 score of 2 today. She marked 1.4 wearing too much and trouble relaxing. She rates her symptoms as not difficult. Continue current regimen. Follow-up in 6 months for Medicare wellness exam  Hematoma, right thigh-recommend compression bandage. Pressure her how to apply this during the office visit today. Also recommended at this point since she's not very tender to start doing some gentle massage over the area and can switch to heat for pain relief.  Right hand fracture-has  appointment on Thursday with orthopedist to determine whether or not she'll need surgical fixation.

## 2016-10-25 DIAGNOSIS — S52571A Other intraarticular fracture of lower end of right radius, initial encounter for closed fracture: Secondary | ICD-10-CM | POA: Diagnosis not present

## 2016-10-25 DIAGNOSIS — M19031 Primary osteoarthritis, right wrist: Secondary | ICD-10-CM | POA: Diagnosis not present

## 2016-10-25 DIAGNOSIS — S52501A Unspecified fracture of the lower end of right radius, initial encounter for closed fracture: Secondary | ICD-10-CM | POA: Insufficient documentation

## 2016-10-25 DIAGNOSIS — M7989 Other specified soft tissue disorders: Secondary | ICD-10-CM | POA: Diagnosis not present

## 2016-10-30 DIAGNOSIS — K219 Gastro-esophageal reflux disease without esophagitis: Secondary | ICD-10-CM | POA: Diagnosis not present

## 2016-10-30 DIAGNOSIS — S52571A Other intraarticular fracture of lower end of right radius, initial encounter for closed fracture: Secondary | ICD-10-CM | POA: Diagnosis not present

## 2016-10-30 DIAGNOSIS — F419 Anxiety disorder, unspecified: Secondary | ICD-10-CM | POA: Diagnosis not present

## 2016-10-30 DIAGNOSIS — Z79899 Other long term (current) drug therapy: Secondary | ICD-10-CM | POA: Diagnosis not present

## 2016-11-01 DIAGNOSIS — M25531 Pain in right wrist: Secondary | ICD-10-CM | POA: Diagnosis not present

## 2016-11-01 DIAGNOSIS — R200 Anesthesia of skin: Secondary | ICD-10-CM | POA: Diagnosis not present

## 2016-11-01 DIAGNOSIS — M79631 Pain in right forearm: Secondary | ICD-10-CM | POA: Diagnosis not present

## 2016-11-01 DIAGNOSIS — M25621 Stiffness of right elbow, not elsewhere classified: Secondary | ICD-10-CM | POA: Diagnosis not present

## 2016-11-01 DIAGNOSIS — S52571D Other intraarticular fracture of lower end of right radius, subsequent encounter for closed fracture with routine healing: Secondary | ICD-10-CM | POA: Diagnosis not present

## 2016-11-02 ENCOUNTER — Encounter: Payer: Self-pay | Admitting: Family Medicine

## 2016-11-02 ENCOUNTER — Ambulatory Visit (INDEPENDENT_AMBULATORY_CARE_PROVIDER_SITE_OTHER): Payer: Medicare Other | Admitting: Family Medicine

## 2016-11-02 VITALS — BP 118/48 | HR 75 | Wt 195.0 lb

## 2016-11-02 DIAGNOSIS — T148XXA Other injury of unspecified body region, initial encounter: Secondary | ICD-10-CM

## 2016-11-02 NOTE — Patient Instructions (Signed)
Thank you for coming in today.   Recheck as needed.   Call or go to the ER if you develop a large red swollen joint with extreme pain or oozing puss.   

## 2016-11-02 NOTE — Progress Notes (Signed)
Lorraine Woods is a 71 y.o. female who presents to [place] today for follow-up large hematoma. Patient was seen a few weeks ago for right distal radius fracture and right lateral hip hematoma after a fall. She has had ORIF of the right distal radius fracture 3 days ago and is feeling well.  The right hematoma persists and is mildly painful. The original injury was 4 weeks ago. The hematoma has persisted despite compression and rest and decreased activity. She denies fevers or chills.   Past Medical History:  Diagnosis Date  . Anxiety   . Basal cell carcinoma    nose  . Knee pain, chronic   . Liver cyst   . Migraine    possible 1 every 2 months  . Wears glasses    Past Surgical History:  Procedure Laterality Date  . ANKLE FRACTURE SURGERY Left   . APPENDECTOMY  1959  . BREAST EXCISIONAL BIOPSY  2002   noncancerous lump  . BREAST LUMPECTOMY Right    on HRT, fibromadenoma  . CHOLECYSTECTOMY N/A 09/17/2013   Procedure: LAPAROSCOPIC CHOLECYSTECTOMY;  Surgeon: Stark Klein, MD;  Location: [facility];  Service: General;  Laterality: N/A;  . LAPAROSCOPIC LIVER CYST UNROOFING N/A 09/17/2013   Procedure: LAPAROSCOPIC LIVER CYST UNROOFING;  Surgeon: Stark Klein, MD;  Location: [facility];  Service: General;  Laterality: N/A;  . ORIF ANKLE FRACTURE  2000   left  . sclerotherapy on left leg  2013  . SKIN CANCER EXCISION    . SKIN CANCER EXCISION     basil cell lerion on nose  . TUBAL LIGATION  1982  . UPPER ENDOSCOPY W/ SCLEROTHERAPY    . VEIN SURGERY Left    Social History  Substance Use Topics  . Smoking status: Never Smoker  . Smokeless tobacco: Never Used  . Alcohol use 0.0 oz/week     Comment: occasional wine -once monthly     ROS:  As above   Medications: Current Outpatient Prescriptions  Medication Sig Dispense Refill  . BIOTIN PO Take 1,000 mcg by mouth every morning.     . calcium gluconate (CALCIUM GLUCONATE) 100 mg/mL  SOLN Take 500 mg by mouth daily.    . Cholecalciferol (VITAMIN D3) 2000 UNITS TABS Take 2,000 Units by mouth every morning.     . loratadine (CLARITIN) 10 MG tablet Take 10 mg by mouth daily.    . Multiple Vitamin (MULTIVITAMIN WITH MINERALS) TABS tablet Take 1 tablet by mouth every morning.    . sertraline (ZOLOFT) 100 MG tablet Take 1 tablet (100 mg total) by mouth every morning. 90 tablet 1  . SUMAtriptan (IMITREX) 100 MG tablet Take 1 tablet (100 mg total) by mouth every 2 (two) hours as needed for migraine. May repeat in 2 hours if headache persists or recurs. 9 tablet 6   No current facility-administered medications for this visit.    Allergies  Allergen Reactions  . Aspirin Other (See Comments)    Bruising.   . Excedrin Migraine [Aspirin-Acetaminophen-Caffeine] Other (See Comments)    Bruising      Exam:  BP (!) 118/48   Pulse 75   Wt 195 lb (88.5 kg)   BMI 27.98 kg/m  General: Well Developed, well nourished, and in no acute distress.  Neuro/Psych: Alert and oriented x3, extra-ocular muscles intact, able to move all 4 extremities, sensation grossly intact. Skin: Warm and dry, no rashes noted.  Respiratory: Not using accessory muscles, speaking in full  sentences, trachea midline.  Cardiovascular: Pulses palpable, no extremity edema. Abdomen: Does not appear distended. MSK: Right lateral hip large 4 cm fluctuant mass at the lateral hip. Nontender no skin erythema induration.  Limited musculoskeletal ultrasound of the right lateral hip reveals a large 2 chamber loculated hypoechoic structure approximately 1.5 x 4 x 2 cm. No blood flow within the cystic structure visible.  The aspiration was initially conducted without ultrasound guidance after superficial ultrasound scan located the cystic structure. Skin was cleaned with alcohol and cold spray applied and skin fully and anesthetized with lidocaine as noted below. 25 mL of dark blood was aspirated. This is a structure was not  fully decompressed on repeat ultrasound scan. The second loculation was still present.  Procedure: Real-time Ultrasound Guided aspiration of loculated right lateral hip liquefied hematoma  Device: GE Logiq E  Images permanently stored and available for review in the ultrasound unit. Verbal informed consent obtained. Discussed risks and benefits of procedure. Warned about infection bleeding damage to structures skin hypopigmentation and fat atrophy among others. Patient expresses understanding and agreement Time-out conducted.  Noted no overlying erythema, induration, or other signs of local infection.  Skin prepped in a sterile fashion.  Local anesthesia: Topical Ethyl chloride followed by 2 mL of lidocaine With sterile technique and under real time ultrasound guidance: The remaining loculated structure was identified and a needle was inserted into the structure. 50 mL of dark blood was aspirated injected easily.  Completed without difficulty   Advised to call if fevers/chills, erythema, induration, drainage, or persistent bleeding.  Images permanently stored and available for review in the ultrasound unit.  Impression: Technically successful ultrasound guided aspiration.   After aspiration and the cystic structure was nearly completely decompressed.   No results found for this or any previous visit (from the past 48 hour(s)). No results found.    Assessment and Plan: 71 y.o. female with persistent liquefied hematoma right lateral hip. Ultrasound guidance needed to decompressed the cystic structure fully due to to loculations. A total of 40 mL of dark bloody fluid was aspirated. It was sent for culture. Continue compression. Recheck as needed.   Orders Placed This Encounter  Procedures  . Body Fluid Culture    Right lateral hip hematoma   No orders of the defined types were placed in this encounter.   Discussed warning signs or symptoms. Please see discharge  instructions. Patient expresses understanding.

## 2016-11-05 DIAGNOSIS — S52571D Other intraarticular fracture of lower end of right radius, subsequent encounter for closed fracture with routine healing: Secondary | ICD-10-CM | POA: Diagnosis not present

## 2016-11-07 LAB — BODY FLUID CULTURE
Gram Stain: NONE SEEN
Organism ID, Bacteria: NO GROWTH

## 2016-11-22 DIAGNOSIS — S52571D Other intraarticular fracture of lower end of right radius, subsequent encounter for closed fracture with routine healing: Secondary | ICD-10-CM | POA: Diagnosis not present

## 2016-11-29 DIAGNOSIS — S52571D Other intraarticular fracture of lower end of right radius, subsequent encounter for closed fracture with routine healing: Secondary | ICD-10-CM | POA: Diagnosis not present

## 2016-12-06 DIAGNOSIS — Z9889 Other specified postprocedural states: Secondary | ICD-10-CM | POA: Diagnosis not present

## 2016-12-06 DIAGNOSIS — F419 Anxiety disorder, unspecified: Secondary | ICD-10-CM | POA: Diagnosis not present

## 2016-12-06 DIAGNOSIS — S52541D Smith's fracture of right radius, subsequent encounter for closed fracture with routine healing: Secondary | ICD-10-CM | POA: Diagnosis not present

## 2016-12-13 DIAGNOSIS — S63591A Other specified sprain of right wrist, initial encounter: Secondary | ICD-10-CM | POA: Diagnosis not present

## 2016-12-13 DIAGNOSIS — S52501D Unspecified fracture of the lower end of right radius, subsequent encounter for closed fracture with routine healing: Secondary | ICD-10-CM | POA: Diagnosis not present

## 2016-12-13 DIAGNOSIS — S52541D Smith's fracture of right radius, subsequent encounter for closed fracture with routine healing: Secondary | ICD-10-CM | POA: Diagnosis not present

## 2016-12-13 DIAGNOSIS — M25531 Pain in right wrist: Secondary | ICD-10-CM | POA: Diagnosis not present

## 2016-12-13 DIAGNOSIS — M19031 Primary osteoarthritis, right wrist: Secondary | ICD-10-CM | POA: Diagnosis not present

## 2016-12-13 DIAGNOSIS — F419 Anxiety disorder, unspecified: Secondary | ICD-10-CM | POA: Diagnosis not present

## 2016-12-13 DIAGNOSIS — M8588 Other specified disorders of bone density and structure, other site: Secondary | ICD-10-CM | POA: Diagnosis not present

## 2016-12-13 DIAGNOSIS — Z9889 Other specified postprocedural states: Secondary | ICD-10-CM | POA: Diagnosis not present

## 2017-02-07 DIAGNOSIS — Z9889 Other specified postprocedural states: Secondary | ICD-10-CM | POA: Diagnosis not present

## 2017-02-07 DIAGNOSIS — M25531 Pain in right wrist: Secondary | ICD-10-CM | POA: Diagnosis not present

## 2017-02-07 DIAGNOSIS — S52501D Unspecified fracture of the lower end of right radius, subsequent encounter for closed fracture with routine healing: Secondary | ICD-10-CM | POA: Diagnosis not present

## 2017-04-23 ENCOUNTER — Encounter: Payer: Self-pay | Admitting: Gastroenterology

## 2017-04-23 ENCOUNTER — Ambulatory Visit (INDEPENDENT_AMBULATORY_CARE_PROVIDER_SITE_OTHER): Payer: Medicare Other | Admitting: Family Medicine

## 2017-04-23 ENCOUNTER — Encounter: Payer: Self-pay | Admitting: Family Medicine

## 2017-04-23 VITALS — BP 130/52 | HR 68 | Ht 70.0 in | Wt 194.0 lb

## 2017-04-23 DIAGNOSIS — M8580 Other specified disorders of bone density and structure, unspecified site: Secondary | ICD-10-CM

## 2017-04-23 DIAGNOSIS — G43009 Migraine without aura, not intractable, without status migrainosus: Secondary | ICD-10-CM | POA: Diagnosis not present

## 2017-04-23 DIAGNOSIS — F411 Generalized anxiety disorder: Secondary | ICD-10-CM | POA: Diagnosis not present

## 2017-04-23 DIAGNOSIS — Z1211 Encounter for screening for malignant neoplasm of colon: Secondary | ICD-10-CM

## 2017-04-23 DIAGNOSIS — Z13220 Encounter for screening for lipoid disorders: Secondary | ICD-10-CM

## 2017-04-23 DIAGNOSIS — B351 Tinea unguium: Secondary | ICD-10-CM | POA: Diagnosis not present

## 2017-04-23 DIAGNOSIS — L989 Disorder of the skin and subcutaneous tissue, unspecified: Secondary | ICD-10-CM

## 2017-04-23 DIAGNOSIS — Z0000 Encounter for general adult medical examination without abnormal findings: Secondary | ICD-10-CM

## 2017-04-23 MED ORDER — EFINACONAZOLE 10 % EX SOLN: 1.0000 "application " | Freq: Every day | CUTANEOUS | 1 refills | Status: DC

## 2017-04-23 MED ORDER — SUMATRIPTAN SUCCINATE 100 MG PO TABS: 100.0000 mg | ORAL_TABLET | ORAL | 6 refills | Status: DC | PRN

## 2017-04-23 MED ORDER — SERTRALINE HCL 100 MG PO TABS: 100.0000 mg | ORAL_TABLET | Freq: Every morning | ORAL | 1 refills | Status: DC

## 2017-04-23 NOTE — Progress Notes (Signed)
Subjective:   Lorraine Woods is a 71 y.o. female who presents for Medicare Annual (Subsequent) preventive examination.  Medication to treat a fungus on her right great toenail.  She would like something topical.  That her eye exam is up-to-date.  She had a performed at my eye doctor.  Has a small spot on the right side of her nose that she would like me to look at today.  She says is been there for years it is not really bothersome.  She says she actually used Aldara cream on it which I had given her previously and it never really resolved but has not grown or changed.  Review of Systems: comprehensive ROS is negative.         Objective:     Vitals: BP (!) 130/52   Pulse 68   Ht 5\' 10"  (1.778 m)   Wt 194 lb (88 kg)   SpO2 99%   BMI 27.84 kg/m   Body mass index is 27.84 kg/m.   Physical Exam  Constitutional: She is oriented to person, place, and time. She appears well-developed and well-nourished.  HENT:  Head: Normocephalic and atraumatic.  Right Ear: External ear normal.  Left Ear: External ear normal.  Nose: Nose normal.  Mouth/Throat: Oropharynx is clear and moist.  TMs and canals are clear bilaterally.  Eyes: EOM are normal. Pupils are equal, round, and reactive to light.  Neck: Normal range of motion. Neck supple.  Cardiovascular: Normal rate, regular rhythm and normal heart sounds.  Pulmonary/Chest: Effort normal and breath sounds normal.  Neurological: She is alert and oriented to person, place, and time.  Skin: Skin is warm and dry.  Does have a small pinkish papule on the right side of the nose.  It looks most consistent with sebaceous hyperplasia versus a basal cell.  Psychiatric: She has a normal mood and affect. Her behavior is normal.      Tobacco Social History   Tobacco Use  Smoking Status Never Smoker  Smokeless Tobacco Never Used     Counseling given: Not Answered   Past Medical History:  Diagnosis Date  . Allergy    seasonal  . Anxiety    . Arthritis 2018  . Basal cell carcinoma    nose  . Depression   . Knee pain, chronic   . Liver cyst   . Migraine    possible 1 every 2 months  . Wears glasses    Past Surgical History:  Procedure Laterality Date  . ANKLE FRACTURE SURGERY Left   . APPENDECTOMY  1959  . BREAST EXCISIONAL BIOPSY  2002   noncancerous lump  . BREAST LUMPECTOMY Right    on HRT, fibromadenoma  . CHOLECYSTECTOMY N/A 09/17/2013   Procedure: LAPAROSCOPIC CHOLECYSTECTOMY;  Surgeon: Stark Klein, MD;  Location: [facility];  Service: General;  Laterality: N/A;  . [place], 2018  . LAPAROSCOPIC LIVER CYST UNROOFING N/A 09/17/2013   Procedure: LAPAROSCOPIC LIVER CYST UNROOFING;  Surgeon: Stark Klein, MD;  Location: [facility];  Service: General;  Laterality: N/A;  . ORIF ANKLE FRACTURE  2000   left  . sclerotherapy on left leg  2013  . SKIN CANCER EXCISION    . SKIN CANCER EXCISION     basil cell lerion on nose  . TUBAL LIGATION  1982  . UPPER ENDOSCOPY W/ SCLEROTHERAPY    . VEIN SURGERY Left    Family History  Problem Relation Age of Onset  . Brain cancer Mother  66  . Cancer Mother   . Diabetes Father   . Hypertension Father   . CAD Father   . Heart disease Father   . Colon polyps Son        Puetz-jehgers syndrome   Social History   Substance and Sexual Activity  Sexual Activity Not Currently  . Partners: Male  . Birth control/protection: Post-menopausal    Outpatient Encounter Medications as of 04/23/2017  Medication Sig  . BIOTIN PO Take 1,000 mcg by mouth every morning.   Marland Kitchen BOSWELLIA SERRATA PO 500 mg daily.  . calcium gluconate (CALCIUM GLUCONATE) 100 mg/mL SOLN Take 500 mg by mouth daily.  . Cholecalciferol (VITAMIN D3) 2000 UNITS TABS Take 2,000 Units by mouth every morning.   . loratadine (CLARITIN) 10 MG tablet Take 10 mg by mouth daily.  . minoxidil (ROGAINE) 2 % external solution Apply 2 (two) times daily topically.  . Multiple Vitamin (MULTIVITAMIN WITH MINERALS) TABS  tablet Take 1 tablet by mouth every morning.  . sertraline (ZOLOFT) 100 MG tablet Take 1 tablet (100 mg total) every morning by mouth.  . SUMAtriptan (IMITREX) 100 MG tablet Take 1 tablet (100 mg total) every 2 (two) hours as needed by mouth for migraine. May repeat in 2 hours if headache persists or recurs.  . [DISCONTINUED] sertraline (ZOLOFT) 100 MG tablet Take 1 tablet (100 mg total) by mouth every morning.  . [DISCONTINUED] SUMAtriptan (IMITREX) 100 MG tablet Take 1 tablet (100 mg total) by mouth every 2 (two) hours as needed for migraine. May repeat in 2 hours if headache persists or recurs.  . Efinaconazole (JUBLIA) 10 % SOLN Apply 1 application daily topically.   No facility-administered encounter medications on file as of 04/23/2017.     Activities of Daily Living No flowsheet data found.  Patient Care Team: Hali Marry, MD as PCP - General (Family Medicine)    Assessment:     Exercise Activities and Dietary recommendations    Goals    None     Fall Risk Fall Risk  04/23/2017 10/23/2016 04/18/2016 04/19/2015 03/23/2014  Falls in the past year? Yes Yes Yes Yes Yes  Number falls in past yr: 1 1 1 1 1   Injury with Fall? Yes Yes No Yes -  Risk for fall due to : Other (Comment) - Other (Comment) - -  Risk for fall due to: Comment not paying attention while walking slipped off of a curb - L knee - -  Follow up Falls prevention discussed Falls prevention discussed Falls evaluation completed - -   Depression Screen PHQ 2/9 Scores 04/23/2017 10/23/2016 04/18/2016 04/19/2015  PHQ - 2 Score 0 1 0 0     Cognitive Function     6CIT Screen 04/23/2017 04/18/2016  What Year? 0 points 0 points  What month? 0 points 0 points  What time? 0 points 0 points  Count back from 20 0 points 0 points  Months in reverse 0 points 0 points  Repeat phrase 4 points 8 points  Total Score 4 8    Immunization History  Administered Date(s) Administered  . Influenza, Seasonal,  Injecte, Preservative Fre 03/05/2014  . Influenza-Unspecified 03/04/2013, 03/05/2015, 03/14/2016  . Pneumococcal Conjugate-13 12/29/2013  . Pneumococcal Polysaccharide-23 06/04/2005, 10/20/2015  . Tdap 03/15/2009  . Zoster 01/03/2012, 01/30/2012   Screening Tests Health Maintenance  Topic Date Due  . COLONOSCOPY  04/25/2017 (Originally 06/04/2016)  . MAMMOGRAM  07/11/2018  . TETANUS/TDAP  03/16/2019  . INFLUENZA VACCINE  Completed  .  DEXA SCAN  Completed  . Hepatitis C Screening  Completed  . PNA vac Low Risk Adult  Completed      Plan:   Medicare Wellness exam   I have personally reviewed and noted the following in the patient's chart:   . Medical and social history -updated  . use of alcohol, tobacco or illicit drugs  -updated per patient history. . Current medications and supplements . Functional ability and status . Nutritional status . Physical activity -up weekly exercise.  Going to the gym twice a week. . Advanced directives . List of other physicians . Hospitalizations, surgeries, and ER visits in previous 12 months . Vitals . Screenings to include cognitive, depression, and falls . Referrals and appointments . Right great toenail onychomycosis-we will treat with deep Jublia.  If too expensive will switch to Penlac. . She also has a lesion on the right side of her nose.  She actually apply the Aldara cream to it for several weeks and it did not really change the lesion.  It looks more consistent with sebaceous hyperplasia but encouraged her to keep an eye on it if at any point it is changing or getting larger than it should be removed.  In addition, I have reviewed and discussed with patient certain preventive protocols, quality metrics, and best practice recommendations. A written personalized care plan for preventive services as well as general preventive health recommendations were provided to patient.     [name], MD  04/23/2017

## 2017-04-23 NOTE — Patient Instructions (Addendum)
Keep an eye on the lesion on the right side of your nose.  You may want to try taking a picture of it on your phone for comparison. Prescription sent for toenail fungus.  If too expensive let me know and we can try Penlac. We will work on getting her colonoscopy scheduled.

## 2017-04-29 DIAGNOSIS — G43009 Migraine without aura, not intractable, without status migrainosus: Secondary | ICD-10-CM | POA: Diagnosis not present

## 2017-04-29 DIAGNOSIS — M8580 Other specified disorders of bone density and structure, unspecified site: Secondary | ICD-10-CM | POA: Diagnosis not present

## 2017-04-29 DIAGNOSIS — Z13220 Encounter for screening for lipoid disorders: Secondary | ICD-10-CM | POA: Diagnosis not present

## 2017-04-29 DIAGNOSIS — F411 Generalized anxiety disorder: Secondary | ICD-10-CM | POA: Diagnosis not present

## 2017-04-30 LAB — LIPID PANEL W/REFLEX DIRECT LDL
CHOL/HDL RATIO: 3.6 (calc) (ref <5.0)
CHOLESTEROL: 214 mg/dL — AB (ref <200)
HDL: 60 mg/dL (ref >50)
LDL Cholesterol (Calc): 132 mg/dL (calc) — ABNORMAL HIGH
NON-HDL CHOLESTEROL (CALC): 154 mg/dL — AB (ref <130)
TRIGLYCERIDES: 110 mg/dL (ref <150)

## 2017-04-30 LAB — COMPLETE METABOLIC PANEL WITH GFR
AG Ratio: 1.5 (calc) (ref 1.0–2.5)
ALKALINE PHOSPHATASE (APISO): 47 U/L (ref 33–130)
ALT: 11 U/L (ref 6–29)
AST: 16 U/L (ref 10–35)
Albumin: 4.1 g/dL (ref 3.6–5.1)
BUN: 17 mg/dL (ref 7–25)
CALCIUM: 8.9 mg/dL (ref 8.6–10.4)
CO2: 28 mmol/L (ref 20–32)
CREATININE: 0.62 mg/dL (ref 0.60–0.93)
Chloride: 105 mmol/L (ref 98–110)
GFR, EST NON AFRICAN AMERICAN: 91 mL/min/{1.73_m2} (ref >=60)
GFR, Est African American: 106 mL/min/{1.73_m2} (ref >=60)
Globulin: 2.8 g/dL (calc) (ref 1.9–3.7)
Glucose, Bld: 102 mg/dL — ABNORMAL HIGH (ref 65–99)
POTASSIUM: 3.9 mmol/L (ref 3.5–5.3)
Sodium: 140 mmol/L (ref 135–146)
Total Bilirubin: 0.7 mg/dL (ref 0.2–1.2)
Total Protein: 6.9 g/dL (ref 6.1–8.1)

## 2017-04-30 LAB — VITAMIN D 25 HYDROXY (VIT D DEFICIENCY, FRACTURES): Vit D, 25-Hydroxy: 41 ng/mL (ref 30–100)

## 2017-04-30 LAB — TSH: TSH: 2.7 m[IU]/L (ref 0.40–4.50)

## 2017-06-14 ENCOUNTER — Other Ambulatory Visit: Payer: Self-pay

## 2017-06-14 ENCOUNTER — Ambulatory Visit (AMBULATORY_SURGERY_CENTER): Payer: Self-pay | Admitting: *Deleted

## 2017-06-14 VITALS — Ht 70.0 in | Wt 195.0 lb

## 2017-06-14 DIAGNOSIS — Z1211 Encounter for screening for malignant neoplasm of colon: Secondary | ICD-10-CM

## 2017-06-14 NOTE — Progress Notes (Signed)
No egg or soy allergy known to patient  No issues with past sedation with any surgeries  or procedures, no intubation problems  No diet pills per patient No home 02 use per patient  No blood thinners per patient  Pt denies issues with constipation  No A fib or A flutter  EMMI video sent to pt's e mail pt declined   

## 2017-06-28 ENCOUNTER — Encounter: Payer: Self-pay | Admitting: Gastroenterology

## 2017-06-28 ENCOUNTER — Ambulatory Visit (AMBULATORY_SURGERY_CENTER): Payer: Medicare Other | Admitting: Gastroenterology

## 2017-06-28 ENCOUNTER — Other Ambulatory Visit: Payer: Self-pay

## 2017-06-28 VITALS — BP 124/43 | HR 62 | Temp 95.9°F | Resp 26 | Ht 70.0 in | Wt 194.0 lb

## 2017-06-28 DIAGNOSIS — Z1212 Encounter for screening for malignant neoplasm of rectum: Secondary | ICD-10-CM | POA: Diagnosis not present

## 2017-06-28 DIAGNOSIS — F419 Anxiety disorder, unspecified: Secondary | ICD-10-CM | POA: Diagnosis not present

## 2017-06-28 DIAGNOSIS — Z1211 Encounter for screening for malignant neoplasm of colon: Secondary | ICD-10-CM

## 2017-06-28 DIAGNOSIS — D120 Benign neoplasm of cecum: Secondary | ICD-10-CM | POA: Diagnosis not present

## 2017-06-28 MED ORDER — SODIUM CHLORIDE 0.9 % IV SOLN
500.0000 mL | Freq: Once | INTRAVENOUS | Status: DC
Start: 2017-06-28 — End: 2017-11-01

## 2017-06-28 NOTE — Progress Notes (Signed)
Called to room to assist during endoscopic procedure.  Patient ID and intended procedure confirmed with present staff. Received instructions for my participation in the procedure from the performing physician.  

## 2017-06-28 NOTE — Patient Instructions (Signed)
YOU HAD AN ENDOSCOPIC PROCEDURE TODAY AT [facility]:   Refer to the procedure report that was given to you for any specific questions about what was found during the examination.  If the procedure report does not answer your questions, please call your gastroenterologist to clarify.  If you requested that your care partner not be given the details of your procedure findings, then the procedure report has been included in a sealed envelope for you to review at your convenience later.  YOU SHOULD EXPECT: Some feelings of bloating in the abdomen. Passage of more gas than usual.  Walking can help get rid of the air that was put into your GI tract during the procedure and reduce the bloating. If you had a lower endoscopy (such as a colonoscopy or flexible sigmoidoscopy) you may notice spotting of blood in your stool or on the toilet paper. If you underwent a bowel prep for your procedure, you may not have a normal bowel movement for a few days.  Please Note:  You might notice some irritation and congestion in your nose or some drainage.  This is from the oxygen used during your procedure.  There is no need for concern and it should clear up in a day or so.  SYMPTOMS TO REPORT IMMEDIATELY:   Following lower endoscopy (colonoscopy or flexible sigmoidoscopy):  Excessive amounts of blood in the stool  Significant tenderness or worsening of abdominal pains  Swelling of the abdomen that is new, acute  Fever of 100F or higher   Following upper endoscopy (EGD)  Vomiting of blood or coffee ground material  New chest pain or pain under the shoulder blades  Painful or persistently difficult swallowing  New shortness of breath  Fever of 100F or higher  Black, tarry-looking stools  For urgent or emergent issues, a gastroenterologist can be reached at any hour by calling [phone].   DIET:  We do recommend a small meal at first, but then you may proceed to your regular diet.  Drink  plenty of fluids but you should avoid alcoholic beverages for 24 hours.  ACTIVITY:  You should plan to take it easy for the rest of today and you should NOT DRIVE or use heavy machinery until tomorrow (because of the sedation medicines used during the test).    FOLLOW UP: Our staff will call the number listed on your records the next business day following your procedure to check on you and address any questions or concerns that you may have regarding the information given to you following your procedure. If we do not reach you, we will leave a message.  However, if you are feeling well and you are not experiencing any problems, there is no need to return our call.  We will assume that you have returned to your regular daily activities without incident.  If any biopsies were taken you will be contacted by phone or by letter within the next 1-3 weeks.  Please call us at [phone] if you have not heard about the biopsies in 3 weeks.    SIGNATURES/CONFIDENTIALITY: You and/or your care partner have signed paperwork which will be entered into your electronic medical record.  These signatures attest to the fact that that the information above on your After Visit Summary has been reviewed and is understood.  Full responsibility of the confidentiality of this discharge information lies with you and/or your care-partner.  Polyp, diverticulosis and hemorrhoid information given.

## 2017-06-28 NOTE — Progress Notes (Signed)
To recovery, report to RN, VSS. 

## 2017-06-28 NOTE — Progress Notes (Signed)
No soy or egg allegyPt's states no medical or surgical changes since previsit or office visit.Pt's states no medical or surgical changes since previsit or office visit.

## 2017-06-28 NOTE — Op Note (Signed)
[place] Patient Name: Lorraine Woods Procedure Date: 06/28/2017 7:53 AM MRN: [id number] Endoscopist: Remo Lipps P. Carma Dwiggins MD, MD Age: 72 Referring MD:  Date of Birth: 08/12/1945 Gender: Female Account #: [id number] Procedure:                Colonoscopy Indications:              Screening for colorectal malignant neoplasm Medicines:                Monitored Anesthesia Care Procedure:                Pre-Anesthesia Assessment:                           - Prior to the procedure, a History and Physical                            was performed, and patient medications and                            allergies were reviewed. The patient's tolerance of                            previous anesthesia was also reviewed. The risks                            and benefits of the procedure and the sedation                            options and risks were discussed with the patient.                            All questions were answered, and informed consent                            was obtained. Prior Anticoagulants: The patient has                            taken no previous anticoagulant or antiplatelet                            agents. ASA Grade Assessment: II - A patient with                            mild systemic disease. After reviewing the risks                            and benefits, the patient was deemed in                            satisfactory condition to undergo the procedure.                           After obtaining informed consent, the colonoscope  was passed under direct vision. Throughout the                            procedure, the patient's blood pressure, pulse, and                            oxygen saturations were monitored continuously. The                            Colonoscope was introduced through the anus and                            advanced to the the cecum, identified by                            appendiceal orifice  and ileocecal valve. The                            colonoscopy was performed without difficulty. The                            patient tolerated the procedure well. The quality                            of the bowel preparation was good. The ileocecal                            valve, appendiceal orifice, and rectum were                            photographed. Scope In: 8:03:13 AM Scope Out: 8:23:24 AM Scope Withdrawal Time: 0 hours 13 minutes 51 seconds  Total Procedure Duration: 0 hours 20 minutes 11 seconds  Findings:                 The perianal and digital rectal examinations were                            normal.                           A 4 to 5 mm polyp was found in the cecum. The polyp                            was sessile. The polyp was removed with a cold                            snare. Resection and retrieval were complete.                           Many medium-mouthed diverticula were found in the                            sigmoid colon.  Internal hemorrhoids were found during retroflexion.                           The exam was otherwise without abnormality. Complications:            No immediate complications. Estimated blood loss:                            Minimal. Estimated Blood Loss:     Estimated blood loss was minimal. Impression:               - One 4 to 5 mm polyp in the cecum, removed with a                            cold snare. Resected and retrieved.                           - Diverticulosis in the sigmoid colon.                           - Internal hemorrhoids.                           - The examination was otherwise normal. Recommendation:           - Patient has a contact number available for                            emergencies. The signs and symptoms of potential                            delayed complications were discussed with the                            patient. Return to normal activities tomorrow.                             Written discharge instructions were provided to the                            patient.                           - Resume previous diet.                           - Continue present medications.                           - Await pathology results.                           - Repeat colonoscopy is recommended for                            surveillance. The colonoscopy date will be  determined after pathology results from today's                            exam become available for review.                           - No ibuprofen, naproxen, or other non-steroidal                            anti-inflammatory drugs for 2 weeks after polyp                            removal. Remo Lipps P. Jennalynn Rivard MD, MD 06/28/2017 8:29:38 AM This report has been signed electronically.

## 2017-07-01 ENCOUNTER — Telehealth: Payer: Self-pay | Admitting: *Deleted

## 2017-07-01 NOTE — Telephone Encounter (Signed)
  Follow up Call-  Call back number 06/28/2017  Post procedure Call Back phone  # [phone]  Permission to leave phone message Yes  Some recent data might be hidden     Patient questions:  Do you have a fever, pain , or abdominal swelling? No. Pain Score  0 *  Have you tolerated food without any problems? Yes.    Have you been able to return to your normal activities? Yes.    Do you have any questions about your discharge instructions: Diet   No. Medications  No. Follow up visit  No.  Do you have questions or concerns about your Care? No.  Actions: * If pain score is 4 or above: No action needed, pain <4. Patient stating she has not had a bowel movement yet. Patient is eating and drinking as usual. Patient without pain or swelling of abdomen. Patient to call if this doesn't resolve. Patient also stating that she his getting laryngitis. Informed patient that is not related to procedure on Friday.

## 2017-07-02 ENCOUNTER — Encounter: Payer: Self-pay | Admitting: Gastroenterology

## 2017-07-12 DIAGNOSIS — Z1231 Encounter for screening mammogram for malignant neoplasm of breast: Secondary | ICD-10-CM | POA: Diagnosis not present

## 2017-07-12 LAB — HM MAMMOGRAPHY

## 2017-07-17 ENCOUNTER — Other Ambulatory Visit: Payer: Self-pay | Admitting: Family Medicine

## 2017-07-19 ENCOUNTER — Encounter: Payer: Self-pay | Admitting: Family Medicine

## 2017-08-09 DIAGNOSIS — Z135 Encounter for screening for eye and ear disorders: Secondary | ICD-10-CM | POA: Diagnosis not present

## 2017-08-09 DIAGNOSIS — H43813 Vitreous degeneration, bilateral: Secondary | ICD-10-CM | POA: Diagnosis not present

## 2017-08-09 DIAGNOSIS — H2513 Age-related nuclear cataract, bilateral: Secondary | ICD-10-CM | POA: Diagnosis not present

## 2017-08-09 DIAGNOSIS — H5203 Hypermetropia, bilateral: Secondary | ICD-10-CM | POA: Diagnosis not present

## 2017-10-25 ENCOUNTER — Ambulatory Visit: Payer: Medicare Other | Admitting: Family Medicine

## 2017-10-29 ENCOUNTER — Ambulatory Visit: Payer: Medicare Other | Admitting: Family Medicine

## 2017-11-01 ENCOUNTER — Encounter: Payer: Self-pay | Admitting: Family Medicine

## 2017-11-01 ENCOUNTER — Ambulatory Visit (INDEPENDENT_AMBULATORY_CARE_PROVIDER_SITE_OTHER): Payer: Medicare Other | Admitting: Family Medicine

## 2017-11-01 VITALS — Ht 70.0 in

## 2017-11-01 DIAGNOSIS — F411 Generalized anxiety disorder: Secondary | ICD-10-CM

## 2017-11-01 DIAGNOSIS — G43009 Migraine without aura, not intractable, without status migrainosus: Secondary | ICD-10-CM

## 2017-11-01 DIAGNOSIS — M2550 Pain in unspecified joint: Secondary | ICD-10-CM | POA: Diagnosis not present

## 2017-11-01 NOTE — Progress Notes (Signed)
6 mo F/U GAD   Subjective:    Patient ID: Lorraine Woods, female    DOB: July 25, 1945, 72 y.o.   MRN: [id number]  HPI  F/U GAD - currently on zoloft 100mg  daily. She is dong well overall. She has been under a lot of stress.  Her husband has been in the hospital 3 times.  He was very ill and had cellulitis and then had a bleeding ulcer.  And her son is getting married in [place] in 2 weeks and says she is having to travel for that and is been a little stressed about that.   F/U migraines Headaches -overall she is actually doing really well she gets about 1-2 migraines per month and just uses her Imitrex which typically works very well.  She also complains that she is been getting a lot of pain in her knees and elbows but says typically if she takes 200 mg of ibuprofen that gives her enough relief that she does well.  Sometimes she will have to take a second tab.  She is going to make sure that that was safe and not going to give her an ulcer.  She also updated me and let me know that her colonoscopy was done in January and she had her mammogram done in February.  Review of Systems  Ht 5\' 10"  (1.778 m)   BMI 27.84 kg/m     Allergies  Allergen Reactions  . Aspirin Other (See Comments)    Bruising.   . Excedrin Migraine [Aspirin-Acetaminophen-Caffeine] Other (See Comments)    Bruising     Past Medical History:  Diagnosis Date  . Allergy    seasonal  . Anxiety   . Arthritis 2018  . Basal cell carcinoma    nose  . Cataract    not fully  . Depression   . Knee pain, chronic   . Liver cyst   . Migraine    possible 1 every 2 months  . Osteoporosis    osteopenia  . Wears glasses     Past Surgical History:  Procedure Laterality Date  . ANKLE FRACTURE SURGERY Left   . APPENDECTOMY  1959  . BREAST EXCISIONAL BIOPSY  2002   noncancerous lump  . BREAST LUMPECTOMY Right    on HRT, fibromadenoma  . CHOLECYSTECTOMY N/A 09/17/2013   Procedure: LAPAROSCOPIC CHOLECYSTECTOMY;   Surgeon: Stark Klein, MD;  Location: [facility];  Service: General;  Laterality: N/A;  . COLONOSCOPY    . [place], 2018  . LAPAROSCOPIC LIVER CYST UNROOFING N/A 09/17/2013   Procedure: LAPAROSCOPIC LIVER CYST UNROOFING;  Surgeon: Stark Klein, MD;  Location: [facility];  Service: General;  Laterality: N/A;  . ORIF ANKLE FRACTURE  2000   left  . sclerotherapy on left leg  2013  . SKIN CANCER EXCISION     basil cell lerion on nose  . TUBAL LIGATION  1982    Social History   Socioeconomic History  . Marital status: Married    Spouse name: Jenny Reichmann   . Number of children: 2  . Years of education: Not on file  . Highest education level: Not on file  Occupational History  . 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 Financial resource strain: Not on file  . Food insecurity:    Worry: Not on file    Inability: Not on file  . Transportation needs:  Medical: Not on file    Non-medical: Not on file  Tobacco Use  . Smoking status: Never Smoker  . Smokeless tobacco: Never Used  Substance and Sexual Activity  . Alcohol use: Yes    Alcohol/week: 0.0 oz    Comment: on special occasions only  . Drug use: No  . Sexual activity: Not Currently    Partners: Male    Birth control/protection: Post-menopausal  Lifestyle  . Physical activity:    Days per week: Not on file    Minutes per session: Not on file  . Stress: Not on file  Relationships  . Social connections:    Talks on phone: Not on file    Gets together: Not on file    Attends religious service: Not on file    Active member of club or organization: Not on file    Attends meetings of clubs or organizations: Not on file    Relationship status: Not on file  . Intimate partner violence:    Fear of current or ex partner: Not on file    Emotionally abused: Not on file    Physically abused: Not on file    Forced sexual activity: Not on file   Other Topics Concern  . Not on file  Social History Narrative   She walks a couple of miles a day. Daily caffeine. 1 cup coffe in aM    Family History  Problem Relation Age of Onset  . Brain cancer Mother 59  . Cancer Mother   . Diabetes Father   . Hypertension Father   . CAD Father   . Heart disease Father   . Colon polyps Son        Puetz-jehgers syndrome  . Colon cancer Neg Hx   . Esophageal cancer Neg Hx   . Rectal cancer Neg Hx   . Stomach cancer Neg Hx     Outpatient Encounter Medications as of 11/01/2017  Medication Sig  . BIOTIN PO Take 1,000 mcg by mouth every morning.   . calcium gluconate (CALCIUM GLUCONATE) 100 mg/mL SOLN Take 500 mg by mouth daily.  . Cholecalciferol (VITAMIN D3) 2000 UNITS TABS Take 2,000 Units by mouth every morning.   Marland Kitchen ibuprofen (ADVIL,MOTRIN) 200 MG tablet Take 200 mg by mouth as needed.  . loratadine (CLARITIN) 10 MG tablet Take 10 mg by mouth daily.  . minoxidil (ROGAINE) 2 % external solution Apply 2 (two) times daily topically.  . Multiple Vitamin (MULTIVITAMIN WITH MINERALS) TABS tablet Take 1 tablet by mouth every morning.  . sertraline (ZOLOFT) 100 MG tablet TAKE 1 TABLET(100 MG) BY MOUTH EVERY MORNING  . SUMAtriptan (IMITREX) 100 MG tablet Take 1 tablet (100 mg total) every 2 (two) hours as needed by mouth for migraine. May repeat in 2 hours if headache persists or recurs.  . [DISCONTINUED] BOSWELLIA SERRATA PO 500 mg daily.  . [DISCONTINUED] sertraline (ZOLOFT) 100 MG tablet Take 1 tablet (100 mg total) every morning by mouth.  . [DISCONTINUED] 0.9 %  sodium chloride infusion    No facility-administered encounter medications on file as of 11/01/2017.          Objective:   Physical Exam  Constitutional: She is oriented to person, place, and time. She appears well-developed and well-nourished.  HENT:  Head: Normocephalic and atraumatic.  Cardiovascular: Normal rate, regular rhythm and normal heart sounds.  Pulmonary/Chest:  Effort normal and breath sounds normal.  Neurological: She is alert and oriented to person, place, and time.  Skin: Skin is warm and dry.  Psychiatric: She has a normal mood and affect. Her behavior is normal.        Assessment & Plan:  GAD -stable.  Continue current dose of Zoloft.  She is happy with this regimen.  Plan to follow-up in 6 months.  Gad 7 score of 3.  Migraine HA -well-controlled overall.  We will continue to monitor.  She has another refill on her Imitrex right now.  Arthralgia -okay to use ibuprofen 200 mg daily.  This is such a small amount that it should be a minimal risk for GI bleed but did warn about monitoring for upset stomach and GI irritation or increased reflux symptoms which can indicate some irritation.  This is also low enough that it really should not have a large impact on renal function but we will keep an eye on it.  She will be due for labs in the fall.

## 2018-02-05 ENCOUNTER — Other Ambulatory Visit: Payer: Self-pay | Admitting: Family Medicine

## 2018-02-11 DIAGNOSIS — Z85828 Personal history of other malignant neoplasm of skin: Secondary | ICD-10-CM | POA: Diagnosis not present

## 2018-02-11 DIAGNOSIS — D485 Neoplasm of uncertain behavior of skin: Secondary | ICD-10-CM | POA: Diagnosis not present

## 2018-02-11 DIAGNOSIS — L905 Scar conditions and fibrosis of skin: Secondary | ICD-10-CM | POA: Diagnosis not present

## 2018-02-11 DIAGNOSIS — C44311 Basal cell carcinoma of skin of nose: Secondary | ICD-10-CM | POA: Diagnosis not present

## 2018-05-05 ENCOUNTER — Ambulatory Visit (INDEPENDENT_AMBULATORY_CARE_PROVIDER_SITE_OTHER): Payer: Medicare Other | Admitting: Family Medicine

## 2018-05-05 ENCOUNTER — Ambulatory Visit: Payer: Medicare Other | Admitting: *Deleted

## 2018-05-05 ENCOUNTER — Encounter: Payer: Self-pay | Admitting: Family Medicine

## 2018-05-05 VITALS — BP 120/52 | HR 71 | Ht 70.0 in | Wt 189.0 lb

## 2018-05-05 VITALS — BP 120/52 | HR 71 | Ht 69.29 in | Wt 189.0 lb

## 2018-05-05 DIAGNOSIS — Z0000 Encounter for general adult medical examination without abnormal findings: Secondary | ICD-10-CM

## 2018-05-05 DIAGNOSIS — M8580 Other specified disorders of bone density and structure, unspecified site: Secondary | ICD-10-CM

## 2018-05-05 DIAGNOSIS — G43009 Migraine without aura, not intractable, without status migrainosus: Secondary | ICD-10-CM | POA: Diagnosis not present

## 2018-05-05 DIAGNOSIS — F411 Generalized anxiety disorder: Secondary | ICD-10-CM | POA: Diagnosis not present

## 2018-05-05 DIAGNOSIS — J3089 Other allergic rhinitis: Secondary | ICD-10-CM

## 2018-05-05 DIAGNOSIS — K7689 Other specified diseases of liver: Secondary | ICD-10-CM | POA: Diagnosis not present

## 2018-05-05 DIAGNOSIS — N760 Acute vaginitis: Secondary | ICD-10-CM

## 2018-05-05 DIAGNOSIS — E559 Vitamin D deficiency, unspecified: Secondary | ICD-10-CM | POA: Diagnosis not present

## 2018-05-05 DIAGNOSIS — Z0001 Encounter for general adult medical examination with abnormal findings: Secondary | ICD-10-CM | POA: Diagnosis not present

## 2018-05-05 LAB — WET PREP FOR TRICH, YEAST, CLUE
MICRO NUMBER: 91439310
SPECIMEN QUALITY 3963: ADEQUATE

## 2018-05-05 MED ORDER — SERTRALINE HCL 100 MG PO TABS: ORAL_TABLET | ORAL | 1 refills | Status: DC

## 2018-05-05 MED ORDER — SUMATRIPTAN SUCCINATE 100 MG PO TABS: 100.0000 mg | ORAL_TABLET | ORAL | 6 refills | Status: DC | PRN

## 2018-05-05 NOTE — Progress Notes (Signed)
Subjective:    CC: Anxiety and Migraines.   HPI:  F/U GAD -currently on Zoloft 100 mg daily. She is doing well overall. She is happy with her regimen and feels like it is controlling her sxs.    F/U migraines -not currently on any prophylaxis.  But she does use sumatriptan as needed. She gets about 2 HA per month on average.    She has had a runny nose sneezing and a little bit of cough from postnasal drip for the last couple months.  She says it does seem better when she takes her Claritin but she has not been using it daily.  No facial pain or pressure no significant nasal congestion.  No fevers chills or sweats.  She also complains of some vaginal itching for about 2 days.  No abnormal discharge.  No sores or lesions.  And antibiotic use.  Hair loss-she is using her minoxidil about every other day.  Past medical history, Surgical history, Family history not pertinant except as noted below, Social history, Allergies, and medications have been entered into the medical record, reviewed, and corrections made.   Review of Systems: No fevers, chills, night sweats, weight loss, chest pain, or shortness of breath.   Objective:    General: Well Developed, well nourished, and in no acute distress.  Neuro: Alert and oriented x3, extra-ocular muscles intact, sensation grossly intact.  HEENT: Normocephalic, atraumatic oropharynx is clear, TMs and canals are clear bilaterally.  No significant cervical lymphadenopathy. Skin: Warm and dry, no rashes. Cardiac: Regular rate and rhythm, no murmurs rubs or gallops, no lower extremity edema.  Respiratory: Clear to auscultation bilaterally. Not using accessory muscles, speaking in full sentences.   Impression and Recommendations:    GAD - GAD - 7 score of 0. Well controlled.  10 you current regimen.  Follow-up in 6 months.  Migraines -overall well controlled.  Continue sertraline at 100mg . She prefers to stay at this dose.   Liver cyst - due to  recheck liver enzymes   Vaginal itching-we will perform wet prep and will treat as appropriate. Wet prep performed.    Allergic  rhinitis-recommend she take her Claritin daily or can use a nasal steroid spray such as Flonase or Nasonex.

## 2018-05-05 NOTE — Progress Notes (Signed)
Subjective:   Lorraine Woods is a 72 y.o. female who presents for Medicare Annual (Subsequent) preventive examination.  Review of Systems:  No ROS.  Medicare Wellness Visit. Additional risk factors are reflected in the social history.  Sleep: Getting 8-9 hours of sleep a  Night. Upon wakening feels rested. Wakes up 1 time a night to use the bathroom.   Home Safety/Smoke Alarms: Feels safe in home. Smoke alarms in place.  Living environment; Lives with husband in 1 story home. No stairs. Shower is a walk in shower no grab bars in place. Seat Belt Safety/Bike Helmet: Wears seat belt.   Female:   Pap- aged out    Shell- utd      Dexa scan- utd       CCS-utd       Objective:     Vitals: There were no vitals taken for this visit.  There is no height or weight on file to calculate BMI.  Advanced Directives 05/05/2018 06/28/2017 04/23/2017 03/23/2014 09/17/2013 09/01/2013  Does Patient Have a Medical Advance Directive? Yes No Yes Yes Patient has advance directive, copy not in chart Patient has advance directive, copy not in chart  Type of Advance Directive [place];Living will - [place];Living will [place];Living will [place];Living will Living will;Healthcare Power of Attorney  Does patient want to make changes to medical advance directive? Yes (MAU/Ambulatory/Procedural Areas - Information given) - No - Patient declined No - Patient declined No change requested No change requested  Copy of [place] in Chart? No - copy requested - No - copy requested No - copy requested Copy requested from family Copy requested from family  Pre-existing out of facility DNR order (yellow form or pink MOST form) - - - - No No    Tobacco Social History   Tobacco Use  Smoking Status Never Smoker  Smokeless Tobacco Never Used     Counseling given: Not Answered   Clinical Intake:  Pre-visit  preparation completed: Yes  Pain : No/denies pain     Nutritional Risks: None Diabetes: No  How often do you need to have someone help you when you read instructions, pamphlets, or other written materials from your doctor or pharmacy?: 1 - Never What is the last grade level you completed in school?: college  Interpreter Needed?: No  Information entered by :: Orlie Dakin, LPN  Past Medical History:  Diagnosis Date  . Allergy    seasonal  . Anxiety   . Arthritis 2018  . Basal cell carcinoma    nose  . Cataract    not fully  . Depression   . Knee pain, chronic   . Liver cyst   . Migraine    possible 1 every 2 months  . Osteoporosis    osteopenia  . Wears glasses    Past Surgical History:  Procedure Laterality Date  . ANKLE FRACTURE SURGERY Left   . APPENDECTOMY  1959  . BREAST EXCISIONAL BIOPSY  2002   noncancerous lump  . BREAST LUMPECTOMY Right    on HRT, fibromadenoma  . CHOLECYSTECTOMY N/A 09/17/2013   Procedure: LAPAROSCOPIC CHOLECYSTECTOMY;  Surgeon: Stark Klein, MD;  Location: [facility];  Service: General;  Laterality: N/A;  . COLONOSCOPY    . [place], 2018  . LAPAROSCOPIC LIVER CYST UNROOFING N/A 09/17/2013   Procedure: LAPAROSCOPIC LIVER CYST UNROOFING;  Surgeon: Stark Klein, MD;  Location: [facility];  Service: General;  Laterality: N/A;  . ORIF ANKLE FRACTURE  2000   left  . sclerotherapy on left leg  2013  . SKIN CANCER EXCISION     basil cell lerion on nose  . TUBAL LIGATION  1982   Family History  Problem Relation Age of Onset  . Brain cancer Mother 52  . Cancer Mother   . Diabetes Father   . Hypertension Father   . CAD Father   . Heart disease Father   . Colon polyps Son        Puetz-jehgers syndrome  . Colon cancer Neg Hx   . Esophageal cancer Neg Hx   . Rectal cancer Neg Hx   . Stomach cancer Neg Hx    Social History   Socioeconomic History  . Marital status: Married    Spouse name: Jenny Reichmann   . Number of children: 2  .  Years of education: 34  . Highest education level: Associate degree: academic program  Occupational History  . 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 Financial resource strain: Not hard at all  . Food insecurity:    Worry: Never true    Inability: Never true  . Transportation needs:    Medical: No    Non-medical: No  Tobacco Use  . Smoking status: Never Smoker  . Smokeless tobacco: Never Used  Substance and Sexual Activity  . Alcohol use: Yes    Alcohol/week: 0.0 standard drinks    Comment: on special occasions only  . Drug use: No  . Sexual activity: Not Currently    Partners: Male    Birth control/protection: Post-menopausal  Lifestyle  . Physical activity:    Days per week: 1 day    Minutes per session: 40 min  . Stress: Not at all  Relationships  . Social connections:    Talks on phone: Once a week    Gets together: Once a week    Attends religious service: More than 4 times per year    Active member of club or organization: Yes    Attends meetings of clubs or organizations: 1 to 4 times per year    Relationship status: Married  Other Topics Concern  . Not on file  Social History Narrative    Daily caffeine. 1 cup coffe in aM. Has joined the [employer] program and does that when she is not working    Outpatient Encounter Medications as of 05/05/2018  Medication Sig  . BIOTIN PO Take 1,000 mcg by mouth every morning.   . calcium gluconate (CALCIUM GLUCONATE) 100 mg/mL SOLN Take 500 mg by mouth daily.  . Cholecalciferol (VITAMIN D3) 2000 UNITS TABS Take 2,000 Units by mouth every morning.   Marland Kitchen ibuprofen (ADVIL,MOTRIN) 200 MG tablet Take 200 mg by mouth as needed.  . loratadine (CLARITIN) 10 MG tablet Take 10 mg by mouth daily.  . minoxidil (ROGAINE) 2 % external solution Apply 2 (two) times daily topically.  . Multiple Vitamin (MULTIVITAMIN WITH MINERALS) TABS tablet Take 1  tablet by mouth every morning.  . sertraline (ZOLOFT) 100 MG tablet TAKE 1 TABLET(100 MG) BY MOUTH EVERY MORNING  . SUMAtriptan (IMITREX) 100 MG tablet Take 1 tablet (100 mg total) by mouth every 2 (two) hours as needed for migraine. May repeat in 2 hours if headache persists or recurs.  . [DISCONTINUED] sertraline (ZOLOFT) 100 MG tablet TAKE 1 TABLET(100  MG) BY MOUTH EVERY MORNING  . [DISCONTINUED] SUMAtriptan (IMITREX) 100 MG tablet Take 1 tablet (100 mg total) every 2 (two) hours as needed by mouth for migraine. May repeat in 2 hours if headache persists or recurs.   No facility-administered encounter medications on file as of 05/05/2018.     Activities of Daily Living In your present state of health, do you have any difficulty performing the following activities: 05/05/2018  Hearing? N  Vision? N  Difficulty concentrating or making decisions? N  Walking or climbing stairs? Y  Comment arthritis in knees  Dressing or bathing? N  Doing errands, shopping? N  Preparing Food and eating ? N  Using the Toilet? N  In the past six months, have you accidently leaked urine? Y  Comment wears panti liner happens when coughs or sneezes  Do you have problems with loss of bowel control? N  Managing your Medications? N  Managing your Finances? N  Housekeeping or managing your Housekeeping? N  Some recent data might be hidden    Patient Care Team: Hali Marry, MD as PCP - General (Family Medicine)    Assessment:   This is a routine wellness examination for Erabella.Physical assessment deferred to PCP.   Exercise Activities and Dietary recommendations Current Exercise Habits: Structured exercise class, Type of exercise: strength training/weights;walking;stretching, Time (Minutes): 45, Frequency (Times/Week): 1, Weekly Exercise (Minutes/Week): 45, Intensity: Mild, Exercise limited by: orthopedic condition(s) Diet Healthy diet, eats a lot of fruit grains and some veggies. Breakfast: post  cereal with bluberries and coffee Lunch: grilled cheese sandwich Dinner: Meat and vegetable      Goals    . Exercise 3x per week (30 min per time)     Exercise at [employer] several times a week aas able       Fall Risk Fall Risk  05/05/2018 05/05/2018 04/23/2017 10/23/2016 04/18/2016  Falls in the past year? 1 1 Yes Yes Yes  Number falls in past yr: 0 0 1 1 1   Injury with Fall? 0 0 Yes Yes No  Risk for fall due to : - Other (Comment) Other (Comment) - Other (Comment)  Risk for fall due to: Comment - she was walking in her yard,uneven surface lost her balance not paying attention while walking slipped off of a curb - L knee  Follow up Falls prevention discussed Falls prevention discussed Falls prevention discussed Falls prevention discussed Falls evaluation completed   Is the patient's home free of loose throw rugs in walkways, pet beds, electrical cords, etc?   yes      Grab bars in the bathroom? no      Handrails on the stairs?   no      Adequate lighting?   yes  Depression Screen PHQ 2/9 Scores 05/05/2018 05/05/2018 11/01/2017 04/23/2017  PHQ - 2 Score 0 0 0 0  PHQ- 9 Score - - 2 -     Cognitive Function     6CIT Screen 05/05/2018 04/23/2017 04/18/2016  What Year? 0 points 0 points 0 points  What month? 0 points 0 points 0 points  What time? 0 points 0 points 0 points  Count back from 20 0 points 0 points 0 points  Months in reverse 0 points 0 points 0 points  Repeat phrase 2 points 4 points 8 points  Total Score 2 4 8     Immunization History  Administered Date(s) Administered  . Influenza, High Dose Seasonal PF 03/12/2016, 03/07/2017  . Influenza, Seasonal, Injecte, Preservative  Fre 03/05/2014  . Influenza,inj,Quad PF,6+ Mos 03/10/2018  . Influenza-Unspecified 03/04/2013, 03/05/2015, 03/14/2016  . Pneumococcal Conjugate-13 12/29/2013  . Pneumococcal Polysaccharide-23 06/04/2005, 10/20/2015  . Tdap 03/15/2009  . Zoster 01/03/2012, 01/30/2012    Screening  Tests Health Maintenance  Topic Date Due  . TETANUS/TDAP  03/16/2019  . MAMMOGRAM  07/13/2019  . COLONOSCOPY  06/28/2022  . INFLUENZA VACCINE  Completed  . DEXA SCAN  Completed  . Hepatitis C Screening  Completed  . PNA vac Low Risk Adult  Completed       Plan:    Ms. Salvo , Thank you for taking time to come for your Medicare Wellness Visit. I appreciate your ongoing commitment to your health goals. Please review the following plan we discussed and let me know if I can assist you in the future.  Please schedule your next medicare wellness visit with me in 1 yr. Bring a copy of your living will and/or healthcare power of attorney to your next office visit.   These are the goals we discussed: Goals    . Exercise 3x per week (30 min per time)     Exercise at [employer] several times a week aas able       This is a list of the screening recommended for you and due dates:  Health Maintenance  Topic Date Due  . Tetanus Vaccine  03/16/2019  . Mammogram  07/13/2019  . Colon Cancer Screening  06/28/2022  . Flu Shot  Completed  . DEXA scan (bone density measurement)  Completed  .  Hepatitis C: One time screening is recommended by Center for Disease Control  (CDC) for  adults born from [birth year] through 1964.   Completed  . Pneumonia vaccines  Completed     I have personally reviewed and noted the following in the patient's chart:   . Medical and social history . Use of alcohol, tobacco or illicit drugs  . Current medications and supplements . Functional ability and status . Nutritional status . Physical activity . Advanced directives . List of other physicians . Hospitalizations, surgeries, and ER visits in previous 12 months . Vitals . Screenings to include cognitive, depression, and falls . Referrals and appointments  In addition, I have reviewed and discussed with patient certain preventive protocols, quality metrics, and best practice recommendations. A written  personalized care plan for preventive services as well as general preventive health recommendations were provided to patient.     Joanne Chars, LPN  [id number]

## 2018-05-05 NOTE — Patient Instructions (Signed)
Lorraine Woods , Thank you for taking time to come for your Medicare Wellness Visit. I appreciate your ongoing commitment to your health goals. Please review the following plan we discussed and let me know if I can assist you in the future.  Please schedule your next medicare wellness visit with me in 1 yr. Bring a copy of your living will and/or healthcare power of attorney to your next office visit.  These are the goals we discussed: Goals    . Exercise 3x per week (30 min per time)     Exercise at [employer] several times a week aas able

## 2018-05-06 LAB — LIPID PANEL
CHOL/HDL RATIO: 4 (calc) (ref <5.0)
Cholesterol: 215 mg/dL — ABNORMAL HIGH (ref <200)
HDL: 54 mg/dL (ref >50)
LDL Cholesterol (Calc): 138 mg/dL (calc) — ABNORMAL HIGH
NON-HDL CHOLESTEROL (CALC): 161 mg/dL — AB (ref <130)
Triglycerides: 117 mg/dL (ref <150)

## 2018-05-06 LAB — COMPLETE METABOLIC PANEL WITH GFR
AG Ratio: 1.4 (calc) (ref 1.0–2.5)
ALKALINE PHOSPHATASE (APISO): 49 U/L (ref 33–130)
ALT: 14 U/L (ref 6–29)
AST: 21 U/L (ref 10–35)
Albumin: 4.2 g/dL (ref 3.6–5.1)
BUN: 21 mg/dL (ref 7–25)
CO2: 28 mmol/L (ref 20–32)
CREATININE: 0.75 mg/dL (ref 0.60–0.93)
Calcium: 9.5 mg/dL (ref 8.6–10.4)
Chloride: 104 mmol/L (ref 98–110)
GFR, EST NON AFRICAN AMERICAN: 80 mL/min/{1.73_m2} (ref >=60)
GFR, Est African American: 93 mL/min/{1.73_m2} (ref >=60)
GLUCOSE: 101 mg/dL — AB (ref 65–99)
Globulin: 3 g/dL (calc) (ref 1.9–3.7)
Potassium: 4.1 mmol/L (ref 3.5–5.3)
SODIUM: 140 mmol/L (ref 135–146)
Total Bilirubin: 0.7 mg/dL (ref 0.2–1.2)
Total Protein: 7.2 g/dL (ref 6.1–8.1)

## 2018-05-06 LAB — VITAMIN D 25 HYDROXY (VIT D DEFICIENCY, FRACTURES): Vit D, 25-Hydroxy: 38 ng/mL (ref 30–100)

## 2018-05-19 DIAGNOSIS — C44311 Basal cell carcinoma of skin of nose: Secondary | ICD-10-CM | POA: Diagnosis not present

## 2018-07-16 DIAGNOSIS — Z1231 Encounter for screening mammogram for malignant neoplasm of breast: Secondary | ICD-10-CM | POA: Diagnosis not present

## 2018-10-06 DIAGNOSIS — M9903 Segmental and somatic dysfunction of lumbar region: Secondary | ICD-10-CM | POA: Diagnosis not present

## 2018-10-06 DIAGNOSIS — M5442 Lumbago with sciatica, left side: Secondary | ICD-10-CM | POA: Diagnosis not present

## 2018-10-07 DIAGNOSIS — M9903 Segmental and somatic dysfunction of lumbar region: Secondary | ICD-10-CM | POA: Diagnosis not present

## 2018-10-07 DIAGNOSIS — M5442 Lumbago with sciatica, left side: Secondary | ICD-10-CM | POA: Diagnosis not present

## 2018-10-08 DIAGNOSIS — M9903 Segmental and somatic dysfunction of lumbar region: Secondary | ICD-10-CM | POA: Diagnosis not present

## 2018-10-08 DIAGNOSIS — M5442 Lumbago with sciatica, left side: Secondary | ICD-10-CM | POA: Diagnosis not present

## 2018-10-13 DIAGNOSIS — M5442 Lumbago with sciatica, left side: Secondary | ICD-10-CM | POA: Diagnosis not present

## 2018-10-13 DIAGNOSIS — M9903 Segmental and somatic dysfunction of lumbar region: Secondary | ICD-10-CM | POA: Diagnosis not present

## 2018-10-14 DIAGNOSIS — M5442 Lumbago with sciatica, left side: Secondary | ICD-10-CM | POA: Diagnosis not present

## 2018-10-14 DIAGNOSIS — M9903 Segmental and somatic dysfunction of lumbar region: Secondary | ICD-10-CM | POA: Diagnosis not present

## 2018-10-15 DIAGNOSIS — M5442 Lumbago with sciatica, left side: Secondary | ICD-10-CM | POA: Diagnosis not present

## 2018-10-15 DIAGNOSIS — M9903 Segmental and somatic dysfunction of lumbar region: Secondary | ICD-10-CM | POA: Diagnosis not present

## 2018-10-20 DIAGNOSIS — M9903 Segmental and somatic dysfunction of lumbar region: Secondary | ICD-10-CM | POA: Diagnosis not present

## 2018-10-20 DIAGNOSIS — M5442 Lumbago with sciatica, left side: Secondary | ICD-10-CM | POA: Diagnosis not present

## 2018-10-21 DIAGNOSIS — M5442 Lumbago with sciatica, left side: Secondary | ICD-10-CM | POA: Diagnosis not present

## 2018-10-21 DIAGNOSIS — M9903 Segmental and somatic dysfunction of lumbar region: Secondary | ICD-10-CM | POA: Diagnosis not present

## 2018-10-22 DIAGNOSIS — M5442 Lumbago with sciatica, left side: Secondary | ICD-10-CM | POA: Diagnosis not present

## 2018-10-22 DIAGNOSIS — M9903 Segmental and somatic dysfunction of lumbar region: Secondary | ICD-10-CM | POA: Diagnosis not present

## 2018-10-28 DIAGNOSIS — M9903 Segmental and somatic dysfunction of lumbar region: Secondary | ICD-10-CM | POA: Diagnosis not present

## 2018-10-28 DIAGNOSIS — M5442 Lumbago with sciatica, left side: Secondary | ICD-10-CM | POA: Diagnosis not present

## 2018-10-29 DIAGNOSIS — M9903 Segmental and somatic dysfunction of lumbar region: Secondary | ICD-10-CM | POA: Diagnosis not present

## 2018-10-29 DIAGNOSIS — M5442 Lumbago with sciatica, left side: Secondary | ICD-10-CM | POA: Diagnosis not present

## 2018-11-03 DIAGNOSIS — M9903 Segmental and somatic dysfunction of lumbar region: Secondary | ICD-10-CM | POA: Diagnosis not present

## 2018-11-03 DIAGNOSIS — M5442 Lumbago with sciatica, left side: Secondary | ICD-10-CM | POA: Diagnosis not present

## 2018-11-04 ENCOUNTER — Ambulatory Visit (INDEPENDENT_AMBULATORY_CARE_PROVIDER_SITE_OTHER): Payer: Medicare Other | Admitting: Family Medicine

## 2018-11-04 ENCOUNTER — Encounter: Payer: Self-pay | Admitting: Family Medicine

## 2018-11-04 VITALS — BP 110/65 | HR 69 | Temp 96.6°F | Ht 69.29 in | Wt 180.0 lb

## 2018-11-04 DIAGNOSIS — F411 Generalized anxiety disorder: Secondary | ICD-10-CM

## 2018-11-04 DIAGNOSIS — J3489 Other specified disorders of nose and nasal sinuses: Secondary | ICD-10-CM | POA: Diagnosis not present

## 2018-11-04 DIAGNOSIS — Z85828 Personal history of other malignant neoplasm of skin: Secondary | ICD-10-CM

## 2018-11-04 DIAGNOSIS — G43009 Migraine without aura, not intractable, without status migrainosus: Secondary | ICD-10-CM

## 2018-11-04 MED ORDER — SUMATRIPTAN SUCCINATE 100 MG PO TABS: 100.0000 mg | ORAL_TABLET | ORAL | 6 refills | Status: DC | PRN

## 2018-11-04 MED ORDER — SERTRALINE HCL 100 MG PO TABS: ORAL_TABLET | ORAL | 1 refills | Status: DC

## 2018-11-04 MED ORDER — IPRATROPIUM BROMIDE 0.03 % NA SOLN: 2.0000 | Freq: Two times a day (BID) | NASAL | 12 refills | Status: DC

## 2018-11-04 NOTE — Progress Notes (Signed)
Virtual Visit via Video Note  I connected with Lorraine Woods on 11/04/18 at  8:30 AM EDT by a video enabled telemedicine application and verified that I am speaking with the correct person using two identifiers.   I discussed the limitations of evaluation and management by telemedicine and the availability of in person appointments. The patient expressed understanding and agreed to proceed. Pt was at home and I was in my office for the virtual visit.     Subjective:    CC: 6 mo f/u  HPI: 28-month follow-up for generalized anxiety disorder-she is currently on Zoloft 100 mg daily.  Tolerating well without any significant side effects.  Migraine headaches-she is not currently on any prophylaxis but does use Imitrex as needed for rescue.  She says she is been getting maybe 1-2 a month.  She does not need a refill on her rescue medication just yet.  Pt just retired from her job.  Just turned in her recognition within the last week.  Pt reports that she has been experiencing more of a runny nose and has been taking Claritin and wanted to know if there was something else that Dr. Madilyn Fireman could suggest for her allergies. She also just had her air ducts in her home cleaned and her allergies are still bothering her.   Having some low back pain and sciatia on her right side. She has been seeing a Restaurant manager, fast food. [place] on going to her massage therapist.   Had a basal cell on her nose.   Past medical history, Surgical history, Family history not pertinant except as noted below, Social history, Allergies, and medications have been entered into the medical record, reviewed, and corrections made.   Review of Systems: No fevers, chills, night sweats, weight loss, chest pain, or shortness of breath.   Objective:    General: Speaking clearly in complete sentences without any shortness of breath.  Alert and oriented x3.  Normal judgment. No apparent acute distress. Well groomed.     Impression and  Recommendations:    GAD -doing well overall.  Would like to continue with her sertraline.  Will up in 6 months.  Migraine HAs -overall doing well.  She still getting 1 to 2/month.  Not currently on any prophylaxis and just using rescue medication but she does not need refills on that today.  Rhinorrhea - Can also try atrovent nasal spray if not helping.  If not helping consider Astepro.  Also consider nasal steroid if those don't help.    .  Basal cell on her nose-chart updated.   I discussed the assessment and treatment plan with the patient. The patient was provided an opportunity to ask questions and all were answered. The patient agreed with the plan and demonstrated an understanding of the instructions.   The patient was advised to call back or seek an in-person evaluation if the symptoms worsen or if the condition fails to improve as anticipated.   [name], MD

## 2018-11-04 NOTE — Progress Notes (Signed)
Pt just retired from her job.  Pt reports that she has been experiencing more of a runny nose and has been taking Claritin and wanted to know if there was something else that Dr. Madilyn Fireman could suggest for her allergies. She also just had her air ducts in her home cleaned and her allergies are still bothering her.Marland KitchenMarland KitchenElouise Woods, [place]

## 2018-11-05 DIAGNOSIS — M5442 Lumbago with sciatica, left side: Secondary | ICD-10-CM | POA: Diagnosis not present

## 2018-11-05 DIAGNOSIS — M9903 Segmental and somatic dysfunction of lumbar region: Secondary | ICD-10-CM | POA: Diagnosis not present

## 2018-12-09 DIAGNOSIS — L821 Other seborrheic keratosis: Secondary | ICD-10-CM | POA: Diagnosis not present

## 2018-12-09 DIAGNOSIS — B351 Tinea unguium: Secondary | ICD-10-CM | POA: Diagnosis not present

## 2018-12-09 DIAGNOSIS — Z85828 Personal history of other malignant neoplasm of skin: Secondary | ICD-10-CM | POA: Diagnosis not present

## 2018-12-09 DIAGNOSIS — L728 Other follicular cysts of the skin and subcutaneous tissue: Secondary | ICD-10-CM | POA: Diagnosis not present

## 2018-12-09 DIAGNOSIS — D225 Melanocytic nevi of trunk: Secondary | ICD-10-CM | POA: Diagnosis not present

## 2018-12-09 DIAGNOSIS — D1801 Hemangioma of skin and subcutaneous tissue: Secondary | ICD-10-CM | POA: Diagnosis not present

## 2018-12-09 DIAGNOSIS — L814 Other melanin hyperpigmentation: Secondary | ICD-10-CM | POA: Diagnosis not present

## 2019-01-28 DIAGNOSIS — L600 Ingrowing nail: Secondary | ICD-10-CM | POA: Diagnosis not present

## 2019-01-28 DIAGNOSIS — M79675 Pain in left toe(s): Secondary | ICD-10-CM | POA: Diagnosis not present

## 2019-02-11 DIAGNOSIS — Z23 Encounter for immunization: Secondary | ICD-10-CM | POA: Diagnosis not present

## 2019-02-17 ENCOUNTER — Ambulatory Visit: Payer: Medicare Other

## 2019-04-28 ENCOUNTER — Ambulatory Visit (INDEPENDENT_AMBULATORY_CARE_PROVIDER_SITE_OTHER): Payer: Medicare Other | Admitting: Family Medicine

## 2019-04-28 ENCOUNTER — Encounter: Payer: Self-pay | Admitting: Family Medicine

## 2019-04-28 ENCOUNTER — Other Ambulatory Visit: Payer: Self-pay

## 2019-04-28 VITALS — BP 117/74 | HR 72 | Ht 69.0 in | Wt 190.0 lb

## 2019-04-28 DIAGNOSIS — H9313 Tinnitus, bilateral: Secondary | ICD-10-CM | POA: Diagnosis not present

## 2019-04-28 DIAGNOSIS — H9193 Unspecified hearing loss, bilateral: Secondary | ICD-10-CM | POA: Diagnosis not present

## 2019-04-28 DIAGNOSIS — Z23 Encounter for immunization: Secondary | ICD-10-CM

## 2019-04-28 DIAGNOSIS — R252 Cramp and spasm: Secondary | ICD-10-CM

## 2019-04-28 DIAGNOSIS — Z13220 Encounter for screening for lipoid disorders: Secondary | ICD-10-CM | POA: Diagnosis not present

## 2019-04-28 DIAGNOSIS — E785 Hyperlipidemia, unspecified: Secondary | ICD-10-CM | POA: Diagnosis not present

## 2019-04-28 DIAGNOSIS — R070 Pain in throat: Secondary | ICD-10-CM

## 2019-04-28 DIAGNOSIS — F411 Generalized anxiety disorder: Secondary | ICD-10-CM | POA: Diagnosis not present

## 2019-04-28 DIAGNOSIS — G43009 Migraine without aura, not intractable, without status migrainosus: Secondary | ICD-10-CM | POA: Diagnosis not present

## 2019-04-28 MED ORDER — TETANUS-DIPHTH-ACELL PERTUSSIS 5-2.5-18.5 LF-MCG/0.5 IM SUSP: 0.5000 mL | Freq: Once | INTRAMUSCULAR | 0 refills | Status: AC

## 2019-04-28 NOTE — Progress Notes (Signed)
Established Patient Office Visit  Subjective:  Patient ID: Lorraine Woods, female    DOB: Oct 06, 1945  Age: 73 y.o. MRN: [id number]  CC:  Chief Complaint  Patient presents with  . Migraine  . mood    HPI SIVI GOODBAR presents for  FU  GAD -he has felt a little bit more stressed and down since being home with Covid and with the recent and what collection.  But feels like things are getting a little bit better.  She is currently on Zoloft 10 mg daily and is tolerating that well without any side effects or problems.  She has been sleeping about 12 hours a day and then often will take a nap in the afternoon.  Recent political events and the Covid pandemic have been very stressful for her and she is no longer working.  Migaines no aura -overall she is doing well.  No recent flares or exacerbations.  She also complains of right sided throat pain.  She says he notices it mostly in the morning its been going on for about 2 months.  She says is not painful to swallow but sometimes it radiates towards her neck and sometimes into her ear.  She denies any known trauma or injury.  She most feels like it is at the very very back of her tongue and into her throat.  Says it does seem to improve right after she uses her Atrovent in the morning but then by about lunchtime it starts to bother her again.  She occ gets a cramp in her right upper abdomen.  It just happens occasionally comes and goes.  She reports she is also noticed a little bit of a tremor in her right leg it usually occurs in the evenings.  Almost like a nervousness.  She also feels like her ear ringing which has been going on for long time is gotten a little bit worse and she feels like she is gradually getting a little bit more hearing loss.  Past Medical History:  Diagnosis Date  . Allergy    seasonal  . Anxiety   . Arthritis 2018  . Basal cell carcinoma    nose  . Cataract    not fully  . Depression   . Knee pain, chronic   .  Liver cyst   . Migraine    possible 1 every 2 months  . Osteoporosis    osteopenia  . Wears glasses     Past Surgical History:  Procedure Laterality Date  . ANKLE FRACTURE SURGERY Left   . APPENDECTOMY  1959  . BREAST EXCISIONAL BIOPSY  2002   noncancerous lump  . BREAST LUMPECTOMY Right    on HRT, fibromadenoma  . CHOLECYSTECTOMY N/A 09/17/2013   Procedure: LAPAROSCOPIC CHOLECYSTECTOMY;  Surgeon: Stark Klein, MD;  Location: [facility];  Service: General;  Laterality: N/A;  . COLONOSCOPY    . [place], 2018  . LAPAROSCOPIC LIVER CYST UNROOFING N/A 09/17/2013   Procedure: LAPAROSCOPIC LIVER CYST UNROOFING;  Surgeon: Stark Klein, MD;  Location: [facility];  Service: General;  Laterality: N/A;  . ORIF ANKLE FRACTURE  2000   left  . sclerotherapy on left leg  2013  . SKIN CANCER EXCISION     basil cell lerion on nose  . TUBAL LIGATION  1982    Family History  Problem Relation Age of Onset  . Brain cancer Mother 26  . Cancer Mother   . Diabetes Father   .  Hypertension Father   . CAD Father   . Heart disease Father   . Colon polyps Son        Puetz-jehgers syndrome  . Colon cancer Neg Hx   . Esophageal cancer Neg Hx   . Rectal cancer Neg Hx   . Stomach cancer Neg Hx     Social History   Socioeconomic History  . Marital status: Married    Spouse name: Jenny Reichmann   . Number of children: 2  . Years of education: 60  . Highest education level: Associate degree: academic program  Occupational History  . 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 Financial resource strain: Not hard at all  . Food insecurity    Worry: Never true    Inability: Never true  . Transportation needs    Medical: No    Non-medical: No  Tobacco Use  . Smoking status: Never Smoker  . Smokeless tobacco: Never Used  Substance and Sexual Activity  . Alcohol use: Yes    Alcohol/week: 0.0 standard drinks     Comment: on special occasions only  . Drug use: No  . Sexual activity: Not Currently    Partners: Male    Birth control/protection: Post-menopausal  Lifestyle  . Physical activity    Days per week: 1 day    Minutes per session: 40 min  . Stress: Not at all  Relationships  . Social Herbalist on phone: Once a week    Gets together: Once a week    Attends religious service: More than 4 times per year    Active member of club or organization: Yes    Attends meetings of clubs or organizations: 1 to 4 times per year    Relationship status: Married  . Intimate partner violence    Fear of current or ex partner: No    Emotionally abused: No    Physically abused: No    Forced sexual activity: No  Other Topics Concern  . Not on file  Social History Narrative    Daily caffeine. 1 cup coffe in aM. Has joined the [employer] program and does that when she is not working    Outpatient Medications Prior to Visit  Medication Sig Dispense Refill  . calcium gluconate (CALCIUM GLUCONATE) 100 mg/mL SOLN Take 500 mg by mouth daily.    . Cholecalciferol (VITAMIN D3) 2000 UNITS TABS Take 2,000 Units by mouth every morning.     Marland Kitchen ibuprofen (ADVIL,MOTRIN) 200 MG tablet Take 200 mg by mouth as needed.    Marland Kitchen ipratropium (ATROVENT) 0.03 % nasal spray Place 2 sprays into both nostrils every 12 (twelve) hours. 30 mL 12  . minoxidil (ROGAINE) 2 % external solution Apply 2 (two) times daily topically.    . Multiple Vitamin (MULTIVITAMIN WITH MINERALS) TABS tablet Take 1 tablet by mouth every morning.    . sertraline (ZOLOFT) 100 MG tablet TAKE 1 TABLET(100 MG) BY MOUTH EVERY MORNING 90 tablet 1  . SUMAtriptan (IMITREX) 100 MG tablet Take 1 tablet (100 mg total) by mouth every 2 (two) hours as needed for migraine. May repeat in 2 hours if headache persists or recurs. 9 tablet 6  . loratadine (CLARITIN) 10 MG tablet Take 10 mg by mouth daily.     No facility-administered medications prior to  visit.     Allergies  Allergen Reactions  . Aspirin  Other (See Comments)    Bruising.   . Excedrin Migraine [Aspirin-Acetaminophen-Caffeine] Other (See Comments)    Bruising     ROS Review of Systems    Objective:    Physical Exam  Constitutional: She is oriented to person, place, and time. She appears well-developed and well-nourished.  HENT:  Head: Normocephalic and atraumatic.  Right Ear: External ear normal.  Left Ear: External ear normal.  Nose: Nose normal.  Mouth/Throat: Oropharynx is clear and moist.  TMs and canals are clear.   Eyes: Pupils are equal, round, and reactive to light. Conjunctivae and EOM are normal.  Neck: Neck supple. No thyromegaly present.  Cardiovascular: Normal rate, regular rhythm and normal heart sounds.  Pulmonary/Chest: Effort normal and breath sounds normal. She has no wheezes.  Lymphadenopathy:    She has no cervical adenopathy.  Neurological: She is alert and oriented to person, place, and time.  Skin: Skin is warm and dry.  Psychiatric: She has a normal mood and affect. Her behavior is normal.    BP 117/74   Pulse 72   Ht 5\' 9"  (1.753 m)   Wt 190 lb (86.2 kg)   SpO2 99%   BMI 28.06 kg/m  Wt Readings from Last 3 Encounters:  04/28/19 190 lb (86.2 kg)  11/04/18 180 lb (81.6 kg)  05/05/18 189 lb (85.7 kg)     Health Maintenance Due  Topic Date Due  . TETANUS/TDAP  03/16/2019    There are no preventive care reminders to display for this patient.  Lab Results  Component Value Date   TSH 2.70 04/29/2017   Lab Results  Component Value Date   WBC 5.4 08/10/2014   HGB 14.5 08/10/2014   HCT 42.2 08/10/2014   MCV 86.3 08/10/2014   PLT 266 08/10/2014   Lab Results  Component Value Date   NA 140 05/05/2018   K 4.1 05/05/2018   CO2 28 05/05/2018   GLUCOSE 101 (H) 05/05/2018   BUN 21 05/05/2018   CREATININE 0.75 05/05/2018   BILITOT 0.7 05/05/2018   ALKPHOS 48 04/18/2016   AST 21 05/05/2018   ALT 14 05/05/2018    PROT 7.2 05/05/2018   ALBUMIN 4.3 04/18/2016   CALCIUM 9.5 05/05/2018   Lab Results  Component Value Date   CHOL 215 (H) 05/05/2018   Lab Results  Component Value Date   HDL 54 05/05/2018   Lab Results  Component Value Date   LDLCALC 138 (H) 05/05/2018   Lab Results  Component Value Date   TRIG 117 05/05/2018   Lab Results  Component Value Date   CHOLHDL 4.0 05/05/2018   Lab Results  Component Value Date   HGBA1C 5.3 04/18/2016      Assessment & Plan:   Problem List Items Addressed This Visit      Cardiovascular and Mediastinum   Migraine without aura, not refractory - Primary    Stable.       Relevant Orders   COMPLETE METABOLIC PANEL WITH GFR   Lipid Panel w/reflex Direct LDL     Other   Tinnitus    Will refer to ENT.      Hyperlipidemia    Due to recheck lipids.      Relevant Orders   COMPLETE METABOLIC PANEL WITH GFR   Lipid Panel w/reflex Direct LDL   CBC   Generalized anxiety disorder    Discussed options.  For now she wants to continue with her current dose of the sertraline.  Did offer  to make adjustments if needed..  She can reach out anytime.       Other Visit Diagnoses    Need for tetanus, diphtheria, and acellular pertussis (Tdap) vaccine in patient of adolescent age or older       Relevant Medications   Tdap ([place]) 5-2.5-18.5 LF-MCG/0.5 injection   Screening for lipid disorders       Relevant Orders   COMPLETE METABOLIC PANEL WITH GFR   Lipid Panel w/reflex Direct LDL   Muscle cramp       Relevant Orders   CBC   Throat pain       Relevant Orders   Ambulatory referral to ENT   Bilateral hearing loss, unspecified hearing loss type         Throat pain-unclear etiology I do not see anything on exam.  Recommend referral to ENT for further work-up and evaluation.  In the short-term and can have her restart her Claritin.  I do find it interesting that she does get temporary relief after she uses her Atrovent nasal spray some and  add back an antihistamine just to see if that helps as well.  Meds ordered this encounter  Medications  . Tdap (BOOSTRIX) 5-2.5-18.5 LF-MCG/0.5 injection    Sig: Inject 0.5 mLs into the muscle once for 1 dose.    Dispense:  0.5 mL    Refill:  0    Follow-up: Return in about 6 months (around 10/26/2019) for Migraine/mood.    [name], MD

## 2019-04-28 NOTE — Assessment & Plan Note (Signed)
Discussed options.  For now she wants to continue with her current dose of the sertraline.  Did offer to make adjustments if needed..  She can reach out anytime.

## 2019-04-28 NOTE — Assessment & Plan Note (Signed)
Due to recheck lipids. 

## 2019-04-28 NOTE — Assessment & Plan Note (Signed)
Will refer to ENT

## 2019-04-28 NOTE — Assessment & Plan Note (Signed)
Stable

## 2019-04-29 ENCOUNTER — Encounter: Payer: Self-pay | Admitting: Family Medicine

## 2019-04-29 ENCOUNTER — Other Ambulatory Visit: Payer: Self-pay

## 2019-04-29 LAB — COMPLETE METABOLIC PANEL WITH GFR
AG Ratio: 1.5 (calc) (ref 1.0–2.5)
ALT: 13 U/L (ref 6–29)
AST: 18 U/L (ref 10–35)
Albumin: 4.2 g/dL (ref 3.6–5.1)
Alkaline phosphatase (APISO): 39 U/L (ref 37–153)
BUN: 16 mg/dL (ref 7–25)
CO2: 29 mmol/L (ref 20–32)
Calcium: 9.3 mg/dL (ref 8.6–10.4)
Chloride: 105 mmol/L (ref 98–110)
Creat: 0.84 mg/dL (ref 0.60–0.93)
GFR, Est African American: 80 mL/min/{1.73_m2} (ref >=60)
GFR, Est Non African American: 69 mL/min/{1.73_m2} (ref >=60)
Globulin: 2.8 g/dL (calc) (ref 1.9–3.7)
Glucose, Bld: 99 mg/dL (ref 65–99)
Potassium: 3.9 mmol/L (ref 3.5–5.3)
Sodium: 142 mmol/L (ref 135–146)
Total Bilirubin: 0.7 mg/dL (ref 0.2–1.2)
Total Protein: 7 g/dL (ref 6.1–8.1)

## 2019-04-29 LAB — CBC
HCT: 43.8 % (ref 35.0–45.0)
Hemoglobin: 14.7 g/dL (ref 11.7–15.5)
MCH: 29.2 pg (ref 27.0–33.0)
MCHC: 33.6 g/dL (ref 32.0–36.0)
MCV: 86.9 fL (ref 80.0–100.0)
MPV: 10.7 fL (ref 7.5–12.5)
Platelets: 247 10*3/uL (ref 140–400)
RBC: 5.04 10*6/uL (ref 3.80–5.10)
RDW: 12.2 % (ref 11.0–15.0)
WBC: 4.5 10*3/uL (ref 3.8–10.8)

## 2019-04-29 LAB — LIPID PANEL W/REFLEX DIRECT LDL
Cholesterol: 217 mg/dL — ABNORMAL HIGH (ref <200)
HDL: 57 mg/dL (ref >=50)
LDL Cholesterol (Calc): 135 mg/dL (calc) — ABNORMAL HIGH
Non-HDL Cholesterol (Calc): 160 mg/dL (calc) — ABNORMAL HIGH (ref <130)
Total CHOL/HDL Ratio: 3.8 (calc) (ref <5.0)
Triglycerides: 126 mg/dL (ref <150)

## 2019-05-05 NOTE — Progress Notes (Signed)
Subjective:   Lorraine Woods is a 73 y.o. female who presents for Medicare Annual (Subsequent) preventive examination.  Review of Systems:  No ROS.  Medicare Wellness Virtual Visit.  Visual/audio telehealth visit, UTA vital signs.   See social history for additional risk factors.    Cardiac Risk Factors include: advanced age (>12men, >50 women) Sleep patterns: Getting 10 hours of sleep a night. Wakes up 1 time during the night to void. Wakes up and feels rested and ready for the day.  Home Safety/Smoke Alarms: Feels safe in home. Smoke alarms in place.  Living environment;Lives with husband in 1 story home. No stairs in the home. Shower is a walk in shower and no grab bars in place. Seat Belt Safety/Bike Helmet: Wears seat belt.   Female:   Pap- Aged out      Mammo- UTD      Dexa scan-  Ordered entered during visit      CCS- UTD     Objective:     Vitals: There were no vitals taken for this visit.  There is no height or weight on file to calculate BMI.  Advanced Directives 05/11/2019 05/05/2018 06/28/2017 04/23/2017 03/23/2014 09/17/2013 09/01/2013  Does Patient Have a Medical Advance Directive? Yes Yes No Yes Yes Patient has advance directive, copy not in chart Patient has advance directive, copy not in chart  Type of Advance Directive [place];Living will [place];Living will - [place];Living will [place];Living will [place];Living will Living will;Healthcare Power of Attorney  Does patient want to make changes to medical advance directive? No - Patient declined Yes (MAU/Ambulatory/Procedural Areas - Information given) - No - Patient declined No - Patient declined No change requested No change requested  Copy of [place] in Chart? No - copy requested No - copy requested - No - copy requested No - copy requested Copy requested from family Copy requested from family   Pre-existing out of facility DNR order (yellow form or pink MOST form) - - - - - No No    Tobacco Social History   Tobacco Use  Smoking Status Never Smoker  Smokeless Tobacco Never Used     Counseling given: Not Answered   Clinical Intake:  Pre-visit preparation completed: Yes  Pain : No/denies pain     Nutritional Risks: None Diabetes: No  How often do you need to have someone help you when you read instructions, pamphlets, or other written materials from your doctor or pharmacy?: 1 - Never What is the last grade level you completed in school?: 16  Interpreter Needed?: No  Information entered by :: Orlie Dakin, LPN  Past Medical History:  Diagnosis Date  . Allergy    seasonal  . Anxiety   . Arthritis 2018  . Basal cell carcinoma    nose  . Cataract    not fully  . Depression   . Knee pain, chronic   . Liver cyst   . Migraine    possible 1 every 2 months  . Osteoporosis    osteopenia  . Wears glasses    Past Surgical History:  Procedure Laterality Date  . ANKLE FRACTURE SURGERY Left   . APPENDECTOMY  1959  . BREAST EXCISIONAL BIOPSY  2002   noncancerous lump  . BREAST LUMPECTOMY Right    on HRT, fibromadenoma  . CHOLECYSTECTOMY N/A 09/17/2013   Procedure: LAPAROSCOPIC CHOLECYSTECTOMY;  Surgeon: Stark Klein, MD;  Location: [facility];  Service: General;  Laterality: N/A;  . COLONOSCOPY    . [place], 2018  . LAPAROSCOPIC LIVER CYST UNROOFING N/A 09/17/2013   Procedure: LAPAROSCOPIC LIVER CYST UNROOFING;  Surgeon: Stark Klein, MD;  Location: [facility];  Service: General;  Laterality: N/A;  . ORIF ANKLE FRACTURE  2000   left  . sclerotherapy on left leg  2013  . SKIN CANCER EXCISION     basil cell lerion on nose  . TUBAL LIGATION  1982   Family History  Problem Relation Age of Onset  . Brain cancer Mother 29  . Cancer Mother   . Diabetes Father   . Hypertension Father   . CAD Father   . Heart disease Father   . Colon polyps Son         Puetz-jehgers syndrome  . Colon cancer Neg Hx   . Esophageal cancer Neg Hx   . Rectal cancer Neg Hx   . Stomach cancer Neg Hx    Social History   Socioeconomic History  . Marital status: Married    Spouse name: Jenny Reichmann   . Number of children: 2  . Years of education: 62  . Highest education level: Associate degree: academic program  Occupational History  . 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 Financial resource strain: Not hard at all  . Food insecurity    Worry: Never true    Inability: Never true  . Transportation needs    Medical: No    Non-medical: No  Tobacco Use  . Smoking status: Never Smoker  . Smokeless tobacco: Never Used  Substance and Sexual Activity  . Alcohol use: Yes    Alcohol/week: 1.0 standard drinks    Types: 1 Glasses of wine per week    Comment: on special occasions only  . Drug use: No  . Sexual activity: Not Currently    Partners: Male    Birth control/protection: Post-menopausal  Lifestyle  . Physical activity    Days per week: 3 days    Minutes per session: 20 min  . Stress: Not at all  Relationships  . Social Herbalist on phone: Once a week    Gets together: Once a week    Attends religious service: More than 4 times per year    Active member of club or organization: No    Attends meetings of clubs or organizations: Never    Relationship status: Married  Other Topics Concern  . Not on file  Social History Narrative    Daily caffeine. 1 cup coffe in aM. Walks in her neighborhood for exercise. Has officially retired from working. Goes antique shopping with her husband.    Outpatient Encounter Medications as of 05/11/2019  Medication Sig  . amoxicillin-clavulanate (AUGMENTIN) 875-125 MG tablet Take 1 tablet by mouth 2 (two) times daily. Take 2 times a day for 10 days  . calcium gluconate (CALCIUM GLUCONATE) 100 mg/mL SOLN Take 500 mg by mouth daily.  .  Cholecalciferol (VITAMIN D3) 2000 UNITS TABS Take 2,000 Units by mouth every morning.   Marland Kitchen ibuprofen (ADVIL,MOTRIN) 200 MG tablet Take 200 mg by mouth as needed.  Marland Kitchen ipratropium (ATROVENT) 0.03 % nasal spray Place 2 sprays into both nostrils every 12 (twelve) hours.  . minoxidil (ROGAINE) 2 % external solution Apply 2 (two) times daily topically.  . Multiple Vitamin (MULTIVITAMIN WITH MINERALS)  TABS tablet Take 1 tablet by mouth every morning.  . sertraline (ZOLOFT) 100 MG tablet TAKE 1 TABLET(100 MG) BY MOUTH EVERY MORNING  . SUMAtriptan (IMITREX) 100 MG tablet Take 1 tablet (100 mg total) by mouth every 2 (two) hours as needed for migraine. May repeat in 2 hours if headache persists or recurs.   No facility-administered encounter medications on file as of 05/11/2019.     Activities of Daily Living In your present state of health, do you have any difficulty performing the following activities: 05/11/2019  Hearing? Y  Comment has tinnitus in right ear  Vision? N  Difficulty concentrating or making decisions? Y  Comment short term memory sometimes  Walking or climbing stairs? Y  Comment knee pain bilaterally- arthritis  Dressing or bathing? N  Doing errands, shopping? N  Preparing Food and eating ? N  Using the Toilet? N  In the past six months, have you accidently leaked urine? Y  Comment wears a panti liner during the day  Do you have problems with loss of bowel control? N  Managing your Medications? N  Managing your Finances? N  Housekeeping or managing your Housekeeping? N  Some recent data might be hidden    Patient Care Team: Hali Marry, MD as PCP - General (Family Medicine)    Assessment:   This is a routine wellness examination for Evangelynn.Physical assessment deferred to PCP.   Exercise Activities and Dietary recommendations Current Exercise Habits: Home exercise routine, Type of exercise: walking, Time (Minutes): 20, Frequency (Times/Week): 3, Weekly Exercise  (Minutes/Week): 60, Intensity: Mild, Exercise limited by: None identified Diet Eating a healthy diet of vegetables, fruits and meats. Breakfast: oatmeal and grits with blueberries  Lunch: grilled cheese sandwich or salad Dinner:  Meat and vegetables Drinks 2 bottles of water daily. Encouraged to increase water intake     Goals    . Exercise 3x per week (30 min per time)     Exercise at [employer] several times a week aas able    . Patient Stated     Would like to go back to gym and [employer] program when ever they open due to [place]  05/11/2019 05/05/2018 05/05/2018 04/23/2017 10/23/2016  Falls in the past year? 0 1 1 Yes Yes  Number falls in past yr: 0 0 0 1 1  Injury with Fall? 0 0 0 Yes Yes  Risk for fall due to : - - Other (Comment) Other (Comment) -  Risk for fall due to: Comment - - she was walking in her yard,uneven surface lost her balance not paying attention while walking slipped off of a curb -  Follow up Falls prevention discussed Falls prevention discussed Falls prevention discussed Falls prevention discussed Falls prevention discussed   Is the patient's home free of loose throw rugs in walkways, pet beds, electrical cords, etc?   yes      Grab bars in the bathroom? no      Handrails on the stairs?   no      Adequate lighting?   yes   Depression Screen PHQ 2/9 Scores 05/11/2019 04/28/2019 11/04/2018 05/05/2018  PHQ - 2 Score 0 2 2 0  PHQ- 9 Score 0 7 4 -  Exception Documentation Medical reason - - -     Cognitive Function     6CIT Screen 05/11/2019 05/05/2018 04/23/2017 04/18/2016  What Year? 0 points 0 points 0 points 0  points  What month? 0 points 0 points 0 points 0 points  What time? 0 points 0 points 0 points 0 points  Count back from 20 0 points 0 points 0 points 0 points  Months in reverse 0 points 0 points 0 points 0 points  Repeat phrase 0 points 2 points 4 points 8 points  Total Score 0 2 4 8     Immunization History   Administered Date(s) Administered  . Fluad Quad(high Dose 65+) 02/11/2019  . Influenza, High Dose Seasonal PF 03/12/2016, 03/07/2017  . Influenza, Seasonal, Injecte, Preservative Fre 03/05/2014  . Influenza,inj,Quad PF,6+ Mos 03/10/2018  . Influenza-Unspecified 03/04/2013, 03/05/2015, 03/14/2016  . Pneumococcal Conjugate-13 12/29/2013  . Pneumococcal Polysaccharide-23 06/04/2005, 10/20/2015  . Tdap 03/15/2009  . Zoster 01/03/2012, 01/30/2012    Screening Tests Health Maintenance  Topic Date Due  . TETANUS/TDAP  03/16/2019  . MAMMOGRAM  07/13/2019  . COLONOSCOPY  06/28/2022  . INFLUENZA VACCINE  Completed  . DEXA SCAN  Completed  . Hepatitis C Screening  Completed  . PNA vac Low Risk Adult  Completed      Plan:    Please schedule your next medicare wellness visit with me in 1 yr.  Ms. Arnwine , Thank you for taking time to come for your Medicare Wellness Visit. I appreciate your ongoing commitment to your health goals. Please review the following plan we discussed and let me know if I can assist you in the future.  Continue doing brain stimulating activities (puzzles, reading, adult coloring books, staying active) to keep memory sharp.  Bring a copy of your living will and/or healthcare power of attorney to your next office visit.   These are the goals we discussed: Goals    . Exercise 3x per week (30 min per time)     Exercise at [employer] several times a week aas able    . Patient Stated     Would like to go back to gym and [employer] program when ever they open due to [place]       This is a list of the screening recommended for you and due dates:  Health Maintenance  Topic Date Due  . Tetanus Vaccine  03/16/2019  . Mammogram  07/13/2019  . Colon Cancer Screening  06/28/2022  . Flu Shot  Completed  . DEXA scan (bone density measurement)  Completed  .  Hepatitis C: One time screening is recommended by Center for Disease Control  (CDC) for  adults born from  18 through 1965.   Completed  . Pneumonia vaccines  Completed      I have personally reviewed and noted the following in the patient's chart:   . Medical and social history . Use of alcohol, tobacco or illicit drugs  . Current medications and supplements . Functional ability and status . Nutritional status . Physical activity . Advanced directives . List of other physicians . Hospitalizations, surgeries, and ER visits in previous 12 months . Vitals . Screenings to include cognitive, depression, and falls . Referrals and appointments  In addition, I have reviewed and discussed with patient certain preventive protocols, quality metrics, and best practice recommendations. A written personalized care plan for preventive services as well as general preventive health recommendations were provided to patient.     Joanne Chars, LPN  [id number]

## 2019-05-06 ENCOUNTER — Ambulatory Visit: Payer: Medicare Other | Admitting: Family Medicine

## 2019-05-08 DIAGNOSIS — H838X3 Other specified diseases of inner ear, bilateral: Secondary | ICD-10-CM | POA: Diagnosis not present

## 2019-05-08 DIAGNOSIS — H903 Sensorineural hearing loss, bilateral: Secondary | ICD-10-CM | POA: Diagnosis not present

## 2019-05-08 DIAGNOSIS — H9311 Tinnitus, right ear: Secondary | ICD-10-CM | POA: Diagnosis not present

## 2019-05-08 DIAGNOSIS — R070 Pain in throat: Secondary | ICD-10-CM | POA: Diagnosis not present

## 2019-05-11 ENCOUNTER — Ambulatory Visit (INDEPENDENT_AMBULATORY_CARE_PROVIDER_SITE_OTHER): Payer: Medicare Other | Admitting: *Deleted

## 2019-05-11 VITALS — BP 116/78 | HR 70 | Ht 70.0 in | Wt 190.0 lb

## 2019-05-11 DIAGNOSIS — Z780 Asymptomatic menopausal state: Secondary | ICD-10-CM | POA: Diagnosis not present

## 2019-05-11 DIAGNOSIS — Z0000 Encounter for general adult medical examination without abnormal findings: Secondary | ICD-10-CM | POA: Diagnosis not present

## 2019-05-11 DIAGNOSIS — Z13820 Encounter for screening for osteoporosis: Secondary | ICD-10-CM | POA: Diagnosis not present

## 2019-05-11 NOTE — Patient Instructions (Signed)
Please schedule your next medicare wellness visit with me in 1 yr.  Lorraine Woods , Thank you for taking time to come for your Medicare Wellness Visit. I appreciate your ongoing commitment to your health goals. Please review the following plan we discussed and let me know if I can assist you in the future.  Continue doing brain stimulating activities (puzzles, reading, adult coloring books, staying active) to keep memory sharp.  Bring a copy of your living will and/or healthcare power of attorney to your next office visit.   These are the goals we discussed: Goals    . Exercise 3x per week (30 min per time)     Exercise at [employer] several times a week aas able    . Patient Stated     Would like to go back to gym and [employer] program when ever they open due to [place]

## 2019-05-20 ENCOUNTER — Other Ambulatory Visit: Payer: Self-pay | Admitting: Family Medicine

## 2019-05-20 ENCOUNTER — Encounter: Payer: Self-pay | Admitting: Family Medicine

## 2019-05-20 DIAGNOSIS — G43009 Migraine without aura, not intractable, without status migrainosus: Secondary | ICD-10-CM

## 2019-05-21 ENCOUNTER — Other Ambulatory Visit: Payer: Self-pay | Admitting: *Deleted

## 2019-05-21 DIAGNOSIS — F411 Generalized anxiety disorder: Secondary | ICD-10-CM

## 2019-05-21 MED ORDER — SERTRALINE HCL 100 MG PO TABS: ORAL_TABLET | ORAL | 1 refills | Status: DC

## 2019-05-27 ENCOUNTER — Other Ambulatory Visit: Payer: Self-pay

## 2019-05-27 ENCOUNTER — Ambulatory Visit (INDEPENDENT_AMBULATORY_CARE_PROVIDER_SITE_OTHER): Payer: Medicare Other

## 2019-05-27 DIAGNOSIS — Z780 Asymptomatic menopausal state: Secondary | ICD-10-CM

## 2019-05-27 DIAGNOSIS — Z13820 Encounter for screening for osteoporosis: Secondary | ICD-10-CM | POA: Diagnosis not present

## 2019-06-22 DIAGNOSIS — H1045 Other chronic allergic conjunctivitis: Secondary | ICD-10-CM | POA: Diagnosis not present

## 2019-06-22 DIAGNOSIS — H2513 Age-related nuclear cataract, bilateral: Secondary | ICD-10-CM | POA: Diagnosis not present

## 2019-06-22 DIAGNOSIS — H43813 Vitreous degeneration, bilateral: Secondary | ICD-10-CM | POA: Diagnosis not present

## 2019-06-22 DIAGNOSIS — H5203 Hypermetropia, bilateral: Secondary | ICD-10-CM | POA: Diagnosis not present

## 2019-07-06 DIAGNOSIS — Z23 Encounter for immunization: Secondary | ICD-10-CM | POA: Diagnosis not present

## 2019-07-17 ENCOUNTER — Ambulatory Visit: Payer: Medicare Other

## 2019-07-27 DIAGNOSIS — Z23 Encounter for immunization: Secondary | ICD-10-CM | POA: Diagnosis not present

## 2019-08-12 DIAGNOSIS — M79671 Pain in right foot: Secondary | ICD-10-CM | POA: Diagnosis not present

## 2019-08-12 DIAGNOSIS — L600 Ingrowing nail: Secondary | ICD-10-CM | POA: Diagnosis not present

## 2019-08-26 DIAGNOSIS — L600 Ingrowing nail: Secondary | ICD-10-CM | POA: Diagnosis not present

## 2019-09-07 DIAGNOSIS — Z1231 Encounter for screening mammogram for malignant neoplasm of breast: Secondary | ICD-10-CM | POA: Diagnosis not present

## 2019-09-07 LAB — HM MAMMOGRAPHY

## 2019-09-21 ENCOUNTER — Encounter: Payer: Self-pay | Admitting: Family Medicine

## 2019-10-12 ENCOUNTER — Other Ambulatory Visit: Payer: Self-pay | Admitting: Family Medicine

## 2019-10-12 DIAGNOSIS — F411 Generalized anxiety disorder: Secondary | ICD-10-CM

## 2019-10-26 ENCOUNTER — Other Ambulatory Visit: Payer: Self-pay

## 2019-10-26 ENCOUNTER — Ambulatory Visit (INDEPENDENT_AMBULATORY_CARE_PROVIDER_SITE_OTHER): Payer: Medicare Other | Admitting: Family Medicine

## 2019-10-26 ENCOUNTER — Encounter: Payer: Self-pay | Admitting: Family Medicine

## 2019-10-26 VITALS — BP 112/41 | HR 65 | Ht 70.0 in | Wt 187.0 lb

## 2019-10-26 DIAGNOSIS — G43009 Migraine without aura, not intractable, without status migrainosus: Secondary | ICD-10-CM

## 2019-10-26 DIAGNOSIS — H9110 Presbycusis, unspecified ear: Secondary | ICD-10-CM | POA: Diagnosis not present

## 2019-10-26 DIAGNOSIS — R200 Anesthesia of skin: Secondary | ICD-10-CM | POA: Diagnosis not present

## 2019-10-26 DIAGNOSIS — J358 Other chronic diseases of tonsils and adenoids: Secondary | ICD-10-CM | POA: Diagnosis not present

## 2019-10-26 DIAGNOSIS — F411 Generalized anxiety disorder: Secondary | ICD-10-CM | POA: Diagnosis not present

## 2019-10-26 DIAGNOSIS — F4010 Social phobia, unspecified: Secondary | ICD-10-CM | POA: Diagnosis not present

## 2019-10-26 NOTE — Assessment & Plan Note (Signed)
Controlled.  Just uses Imitrex as needed.

## 2019-10-26 NOTE — Assessment & Plan Note (Signed)
Encouraged her to call the ENT office back to see if they made any additional recommendations or at least recommend repeat hearing testing at some point.

## 2019-10-26 NOTE — Assessment & Plan Note (Signed)
Doing okay on current regimen with sertraline.  Again has been a little bit more stressful year but says things are starting to get back to normal a little bit she is been able to get out meet with friends.  And this is been helpful.

## 2019-10-26 NOTE — Progress Notes (Signed)
Established Patient Office Visit  Subjective:  Patient ID: Lorraine Woods, female    DOB: 04-03-1946  Age: 74 y.o. MRN: [id number]  CC:  Chief Complaint  Patient presents with  . mood  . Migraine    HPI Lorraine Woods presents for   F/U Migraines  -overall she is actually doing really well with her migraines.  No recent flares since I last saw her.  She did want a let me know that she saw ENT since she was last here she was having some throat pain radiating towards her ear.  She was diagnosed with a tonsillar stone which was removed and then she was put on an antibiotic.  She did have a hearing test done as well.  She has completed the antibiotic.  She is just not sure if she needs to do any additional follow-up on the hearing screening.  F/U Anxiety -he is doing okay overall she reports that she actually sleeps well about 10 to 12 hours a day.  This last year has been a little bit stressful especially being at home with her husband.  She says sometimes it just is hard for her to go to sleep initially because her mind is just going and she will ruminate about things.  She also complains of some low back pain as well as some pain on the right outer knee and some numbness and tingling in her second and third toes on her right foot.  Past Medical History:  Diagnosis Date  . Allergy    seasonal  . Anxiety   . Arthritis 2018  . Basal cell carcinoma    nose  . Cataract    not fully  . Depression   . Knee pain, chronic   . Liver cyst   . Migraine    possible 1 every 2 months  . Osteoporosis    osteopenia  . Wears glasses     Past Surgical History:  Procedure Laterality Date  . ANKLE FRACTURE SURGERY Left   . APPENDECTOMY  1959  . BREAST EXCISIONAL BIOPSY  2002   noncancerous lump  . BREAST LUMPECTOMY Right    on HRT, fibromadenoma  . CHOLECYSTECTOMY N/A 09/17/2013   Procedure: LAPAROSCOPIC CHOLECYSTECTOMY;  Surgeon: Stark Klein, MD;  Location: [facility];  Service: General;   Laterality: N/A;  . COLONOSCOPY    . [place], 2018  . LAPAROSCOPIC LIVER CYST UNROOFING N/A 09/17/2013   Procedure: LAPAROSCOPIC LIVER CYST UNROOFING;  Surgeon: Stark Klein, MD;  Location: [facility];  Service: General;  Laterality: N/A;  . ORIF ANKLE FRACTURE  2000   left  . sclerotherapy on left leg  2013  . SKIN CANCER EXCISION     basil cell lerion on nose  . TUBAL LIGATION  1982    Family History  Problem Relation Age of Onset  . Brain cancer Mother 64  . Cancer Mother   . Diabetes Father   . Hypertension Father   . CAD Father   . Heart disease Father   . Colon polyps Son        Puetz-jehgers syndrome  . Colon cancer Neg Hx   . Esophageal cancer Neg Hx   . Rectal cancer Neg Hx   . Stomach cancer Neg Hx     Social History   Socioeconomic History  . Marital status: Married    Spouse name: Lorraine Woods   . Number of children: 2  . Years of education: 19  .  Highest education level: Associate degree: academic program  Occupational History  . 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 Smoking status: Never Smoker  . Smokeless tobacco: Never Used  Substance and Sexual Activity  . Alcohol use: Yes    Alcohol/week: 1.0 standard drinks    Types: 1 Glasses of wine per week    Comment: on special occasions only  . Drug use: No  . Sexual activity: Not Currently    Partners: Male    Birth control/protection: Post-menopausal  Other Topics Concern  . Not on file  Social History Narrative    Daily caffeine. 1 cup coffe in aM. Walks in her neighborhood for exercise. Has officially retired from working. Goes antique shopping with her husband.   Social Determinants of Health   Financial Resource Strain:   . Difficulty of Paying Living Expenses:   Food Insecurity:   . Worried About Charity fundraiser in the Last Year:   . Arboriculturist in the Last Year:   Transportation Needs:   . Lexicographer (Medical):   Marland Kitchen Lack of Transportation (Non-Medical):   Physical Activity: Insufficiently Active  . Days of Exercise per Week: 3 days  . Minutes of Exercise per Session: 20 min  Stress:   . Feeling of Stress :   Social Connections: Unknown  . Frequency of Communication with Friends and Family: Not on file  . Frequency of Social Gatherings with Friends and Family: Not on file  . Attends Religious Services: Not on file  . Active Member of Clubs or Organizations: No  . Attends Archivist Meetings: Never  . Marital Status: Not on file  Intimate Partner Violence:   . Fear of Current or Ex-Partner:   . Emotionally Abused:   Marland Kitchen Physically Abused:   . Sexually Abused:     Outpatient Medications Prior to Visit  Medication Sig Dispense Refill  . calcium gluconate (CALCIUM GLUCONATE) 100 mg/mL SOLN Take 500 mg by mouth daily.    . Cholecalciferol (VITAMIN D3) 2000 UNITS TABS Take 2,000 Units by mouth every morning.     Marland Kitchen ibuprofen (ADVIL,MOTRIN) 200 MG tablet Take 200 mg by mouth as needed.    Marland Kitchen ipratropium (ATROVENT) 0.03 % nasal spray Place 2 sprays into both nostrils every 12 (twelve) hours. 30 mL 12  . minoxidil (ROGAINE) 2 % external solution Apply 2 (two) times daily topically.    . Multiple Vitamin (MULTIVITAMIN WITH MINERALS) TABS tablet Take 1 tablet by mouth every morning.    . sertraline (ZOLOFT) 100 MG tablet TAKE 1 TABLET BY MOUTH EVERY DAY IN THE MORNING 90 tablet 1  . SUMAtriptan (IMITREX) 100 MG tablet TAKE 1 TABLET BY MOUTH EVERY 2 HOURS AS NEEDED FOR MIGRAINE. MAY REPEAT IN 2 HOURS IF HEADACHE PERSISTS OR RECURS 9 tablet 6  . amoxicillin-clavulanate (AUGMENTIN) 875-125 MG tablet Take 1 tablet by mouth 2 (two) times daily. Take 2 times a day for 10 days     No facility-administered medications prior to visit.    Allergies  Allergen Reactions  . Aspirin Other (See Comments)    Bruising.   . Excedrin Migraine [Aspirin-Acetaminophen-Caffeine] Other  (See Comments)    Bruising     ROS Review of Systems    Objective:    Physical Exam  Constitutional: She is oriented to person, place, and time. She appears well-developed and well-nourished.  HENT:  Head: Normocephalic and atraumatic.  Cardiovascular: Normal rate, regular rhythm and normal heart sounds.  Pulmonary/Chest: Effort normal and breath sounds normal.  Neurological: She is alert and oriented to person, place, and time.  Skin: Skin is warm and dry.  Psychiatric: She has a normal mood and affect. Her behavior is normal.    BP (!) 112/41   Pulse 65   Ht 5\' 10"  (1.778 m)   Wt 187 lb (84.8 kg)   SpO2 96%   BMI 26.83 kg/m  Wt Readings from Last 3 Encounters:  10/26/19 187 lb (84.8 kg)  05/11/19 190 lb (86.2 kg)  04/28/19 190 lb (86.2 kg)     There are no preventive care reminders to display for this patient.  There are no preventive care reminders to display for this patient.  Lab Results  Component Value Date   TSH 2.70 04/29/2017   Lab Results  Component Value Date   WBC 4.5 04/28/2019   HGB 14.7 04/28/2019   HCT 43.8 04/28/2019   MCV 86.9 04/28/2019   PLT 247 04/28/2019   Lab Results  Component Value Date   NA 142 04/28/2019   K 3.9 04/28/2019   CO2 29 04/28/2019   GLUCOSE 99 04/28/2019   BUN 16 04/28/2019   CREATININE 0.84 04/28/2019   BILITOT 0.7 04/28/2019   ALKPHOS 48 04/18/2016   AST 18 04/28/2019   ALT 13 04/28/2019   PROT 7.0 04/28/2019   ALBUMIN 4.3 04/18/2016   CALCIUM 9.3 04/28/2019   Lab Results  Component Value Date   CHOL 217 (H) 04/28/2019   Lab Results  Component Value Date   HDL 57 04/28/2019   Lab Results  Component Value Date   LDLCALC 135 (H) 04/28/2019   Lab Results  Component Value Date   TRIG 126 04/28/2019   Lab Results  Component Value Date   CHOLHDL 3.8 04/28/2019   Lab Results  Component Value Date   HGBA1C 5.3 04/18/2016      Assessment & Plan:   Problem List Items Addressed This Visit       Cardiovascular and Mediastinum   Migraine without aura, not refractory - Primary    Controlled.  Just uses Imitrex as needed.        Respiratory   Tonsil stone     Nervous and Auditory   Hearing loss of aging    Encouraged her to call the ENT office back to see if they made any additional recommendations or at least recommend repeat hearing testing at some point.        Other   Social anxiety disorder   Generalized anxiety disorder    Doing okay on current regimen with sertraline.  Again has been a little bit more stressful year but says things are starting to get back to normal a little bit she is been able to get out meet with friends.  And this is been helpful.       Other Visit Diagnoses    Numbness of toes          Numbness of second and third toe on the right.  She says she notices it mostly at night but occasionally during the day no injury or trauma.  Discussed that it is likely a pinched nerve in her foot and we could consider a podiatry referral.  She said she will hold off for now keep an eye on it but if it persists she will let me know.  Low back pain-says she  may try to get back in with her chiropractor believe.  He has been doing a little bit more work in the yard etc.  No orders of the defined types were placed in this encounter.   Follow-up: Return in about 6 months (around 04/27/2020) for medications and migraines .    [name], MD

## 2019-11-25 ENCOUNTER — Other Ambulatory Visit: Payer: Self-pay | Admitting: Family Medicine

## 2019-12-17 DIAGNOSIS — D1801 Hemangioma of skin and subcutaneous tissue: Secondary | ICD-10-CM | POA: Diagnosis not present

## 2019-12-17 DIAGNOSIS — L720 Epidermal cyst: Secondary | ICD-10-CM | POA: Diagnosis not present

## 2019-12-17 DIAGNOSIS — L821 Other seborrheic keratosis: Secondary | ICD-10-CM | POA: Diagnosis not present

## 2019-12-17 DIAGNOSIS — Z85828 Personal history of other malignant neoplasm of skin: Secondary | ICD-10-CM | POA: Diagnosis not present

## 2019-12-17 DIAGNOSIS — L814 Other melanin hyperpigmentation: Secondary | ICD-10-CM | POA: Diagnosis not present

## 2019-12-17 DIAGNOSIS — L905 Scar conditions and fibrosis of skin: Secondary | ICD-10-CM | POA: Diagnosis not present

## 2020-02-03 DIAGNOSIS — K219 Gastro-esophageal reflux disease without esophagitis: Secondary | ICD-10-CM | POA: Diagnosis not present

## 2020-02-03 DIAGNOSIS — J3502 Chronic adenoiditis: Secondary | ICD-10-CM | POA: Diagnosis not present

## 2020-02-19 DIAGNOSIS — H838X3 Other specified diseases of inner ear, bilateral: Secondary | ICD-10-CM | POA: Diagnosis not present

## 2020-02-19 DIAGNOSIS — H903 Sensorineural hearing loss, bilateral: Secondary | ICD-10-CM | POA: Diagnosis not present

## 2020-02-26 ENCOUNTER — Encounter: Payer: Self-pay | Admitting: Family Medicine

## 2020-02-26 NOTE — Telephone Encounter (Signed)
Called and advised pt to go get a NEW tube of Monistat and if sxs don't resolve after weekend to call office to get wetprep done. She voiced understanding and agreed.

## 2020-02-26 NOTE — Telephone Encounter (Signed)
Monistat usually works well.  But if her symptoms do not resolve after the weekend she is welcome to come in for a wet prep.

## 2020-03-07 DIAGNOSIS — Z23 Encounter for immunization: Secondary | ICD-10-CM | POA: Diagnosis not present

## 2020-04-05 DIAGNOSIS — M25561 Pain in right knee: Secondary | ICD-10-CM | POA: Diagnosis not present

## 2020-04-05 DIAGNOSIS — M4727 Other spondylosis with radiculopathy, lumbosacral region: Secondary | ICD-10-CM | POA: Diagnosis not present

## 2020-04-05 DIAGNOSIS — M9903 Segmental and somatic dysfunction of lumbar region: Secondary | ICD-10-CM | POA: Diagnosis not present

## 2020-04-05 DIAGNOSIS — M9906 Segmental and somatic dysfunction of lower extremity: Secondary | ICD-10-CM | POA: Diagnosis not present

## 2020-04-05 DIAGNOSIS — M9904 Segmental and somatic dysfunction of sacral region: Secondary | ICD-10-CM | POA: Diagnosis not present

## 2020-04-05 DIAGNOSIS — M4728 Other spondylosis with radiculopathy, sacral and sacrococcygeal region: Secondary | ICD-10-CM | POA: Diagnosis not present

## 2020-04-06 DIAGNOSIS — M4727 Other spondylosis with radiculopathy, lumbosacral region: Secondary | ICD-10-CM | POA: Diagnosis not present

## 2020-04-06 DIAGNOSIS — M9904 Segmental and somatic dysfunction of sacral region: Secondary | ICD-10-CM | POA: Diagnosis not present

## 2020-04-06 DIAGNOSIS — M9906 Segmental and somatic dysfunction of lower extremity: Secondary | ICD-10-CM | POA: Diagnosis not present

## 2020-04-06 DIAGNOSIS — M4728 Other spondylosis with radiculopathy, sacral and sacrococcygeal region: Secondary | ICD-10-CM | POA: Diagnosis not present

## 2020-04-06 DIAGNOSIS — M25561 Pain in right knee: Secondary | ICD-10-CM | POA: Diagnosis not present

## 2020-04-06 DIAGNOSIS — Z23 Encounter for immunization: Secondary | ICD-10-CM | POA: Diagnosis not present

## 2020-04-06 DIAGNOSIS — M9903 Segmental and somatic dysfunction of lumbar region: Secondary | ICD-10-CM | POA: Diagnosis not present

## 2020-04-07 DIAGNOSIS — M9904 Segmental and somatic dysfunction of sacral region: Secondary | ICD-10-CM | POA: Diagnosis not present

## 2020-04-07 DIAGNOSIS — M9906 Segmental and somatic dysfunction of lower extremity: Secondary | ICD-10-CM | POA: Diagnosis not present

## 2020-04-07 DIAGNOSIS — M4728 Other spondylosis with radiculopathy, sacral and sacrococcygeal region: Secondary | ICD-10-CM | POA: Diagnosis not present

## 2020-04-07 DIAGNOSIS — M9903 Segmental and somatic dysfunction of lumbar region: Secondary | ICD-10-CM | POA: Diagnosis not present

## 2020-04-07 DIAGNOSIS — M25561 Pain in right knee: Secondary | ICD-10-CM | POA: Diagnosis not present

## 2020-04-07 DIAGNOSIS — M4727 Other spondylosis with radiculopathy, lumbosacral region: Secondary | ICD-10-CM | POA: Diagnosis not present

## 2020-04-11 DIAGNOSIS — M4728 Other spondylosis with radiculopathy, sacral and sacrococcygeal region: Secondary | ICD-10-CM | POA: Diagnosis not present

## 2020-04-11 DIAGNOSIS — M4727 Other spondylosis with radiculopathy, lumbosacral region: Secondary | ICD-10-CM | POA: Diagnosis not present

## 2020-04-11 DIAGNOSIS — M9904 Segmental and somatic dysfunction of sacral region: Secondary | ICD-10-CM | POA: Diagnosis not present

## 2020-04-11 DIAGNOSIS — M9903 Segmental and somatic dysfunction of lumbar region: Secondary | ICD-10-CM | POA: Diagnosis not present

## 2020-04-11 DIAGNOSIS — M25561 Pain in right knee: Secondary | ICD-10-CM | POA: Diagnosis not present

## 2020-04-11 DIAGNOSIS — M9906 Segmental and somatic dysfunction of lower extremity: Secondary | ICD-10-CM | POA: Diagnosis not present

## 2020-04-12 DIAGNOSIS — M9904 Segmental and somatic dysfunction of sacral region: Secondary | ICD-10-CM | POA: Diagnosis not present

## 2020-04-12 DIAGNOSIS — M9903 Segmental and somatic dysfunction of lumbar region: Secondary | ICD-10-CM | POA: Diagnosis not present

## 2020-04-12 DIAGNOSIS — M4728 Other spondylosis with radiculopathy, sacral and sacrococcygeal region: Secondary | ICD-10-CM | POA: Diagnosis not present

## 2020-04-12 DIAGNOSIS — M4727 Other spondylosis with radiculopathy, lumbosacral region: Secondary | ICD-10-CM | POA: Diagnosis not present

## 2020-04-12 DIAGNOSIS — M25561 Pain in right knee: Secondary | ICD-10-CM | POA: Diagnosis not present

## 2020-04-12 DIAGNOSIS — M9906 Segmental and somatic dysfunction of lower extremity: Secondary | ICD-10-CM | POA: Diagnosis not present

## 2020-04-13 DIAGNOSIS — M4728 Other spondylosis with radiculopathy, sacral and sacrococcygeal region: Secondary | ICD-10-CM | POA: Diagnosis not present

## 2020-04-13 DIAGNOSIS — M25561 Pain in right knee: Secondary | ICD-10-CM | POA: Diagnosis not present

## 2020-04-13 DIAGNOSIS — M9903 Segmental and somatic dysfunction of lumbar region: Secondary | ICD-10-CM | POA: Diagnosis not present

## 2020-04-13 DIAGNOSIS — M9906 Segmental and somatic dysfunction of lower extremity: Secondary | ICD-10-CM | POA: Diagnosis not present

## 2020-04-13 DIAGNOSIS — M9904 Segmental and somatic dysfunction of sacral region: Secondary | ICD-10-CM | POA: Diagnosis not present

## 2020-04-13 DIAGNOSIS — M4727 Other spondylosis with radiculopathy, lumbosacral region: Secondary | ICD-10-CM | POA: Diagnosis not present

## 2020-04-18 DIAGNOSIS — M4728 Other spondylosis with radiculopathy, sacral and sacrococcygeal region: Secondary | ICD-10-CM | POA: Diagnosis not present

## 2020-04-18 DIAGNOSIS — M4727 Other spondylosis with radiculopathy, lumbosacral region: Secondary | ICD-10-CM | POA: Diagnosis not present

## 2020-04-18 DIAGNOSIS — M9904 Segmental and somatic dysfunction of sacral region: Secondary | ICD-10-CM | POA: Diagnosis not present

## 2020-04-18 DIAGNOSIS — M9906 Segmental and somatic dysfunction of lower extremity: Secondary | ICD-10-CM | POA: Diagnosis not present

## 2020-04-18 DIAGNOSIS — M9903 Segmental and somatic dysfunction of lumbar region: Secondary | ICD-10-CM | POA: Diagnosis not present

## 2020-04-18 DIAGNOSIS — M25561 Pain in right knee: Secondary | ICD-10-CM | POA: Diagnosis not present

## 2020-04-19 DIAGNOSIS — M4727 Other spondylosis with radiculopathy, lumbosacral region: Secondary | ICD-10-CM | POA: Diagnosis not present

## 2020-04-19 DIAGNOSIS — M25561 Pain in right knee: Secondary | ICD-10-CM | POA: Diagnosis not present

## 2020-04-19 DIAGNOSIS — M4728 Other spondylosis with radiculopathy, sacral and sacrococcygeal region: Secondary | ICD-10-CM | POA: Diagnosis not present

## 2020-04-19 DIAGNOSIS — M9904 Segmental and somatic dysfunction of sacral region: Secondary | ICD-10-CM | POA: Diagnosis not present

## 2020-04-19 DIAGNOSIS — M9906 Segmental and somatic dysfunction of lower extremity: Secondary | ICD-10-CM | POA: Diagnosis not present

## 2020-04-19 DIAGNOSIS — M9903 Segmental and somatic dysfunction of lumbar region: Secondary | ICD-10-CM | POA: Diagnosis not present

## 2020-04-20 DIAGNOSIS — M9904 Segmental and somatic dysfunction of sacral region: Secondary | ICD-10-CM | POA: Diagnosis not present

## 2020-04-20 DIAGNOSIS — M4728 Other spondylosis with radiculopathy, sacral and sacrococcygeal region: Secondary | ICD-10-CM | POA: Diagnosis not present

## 2020-04-20 DIAGNOSIS — M9903 Segmental and somatic dysfunction of lumbar region: Secondary | ICD-10-CM | POA: Diagnosis not present

## 2020-04-20 DIAGNOSIS — M9906 Segmental and somatic dysfunction of lower extremity: Secondary | ICD-10-CM | POA: Diagnosis not present

## 2020-04-20 DIAGNOSIS — M4727 Other spondylosis with radiculopathy, lumbosacral region: Secondary | ICD-10-CM | POA: Diagnosis not present

## 2020-04-20 DIAGNOSIS — M25561 Pain in right knee: Secondary | ICD-10-CM | POA: Diagnosis not present

## 2020-04-25 DIAGNOSIS — M9903 Segmental and somatic dysfunction of lumbar region: Secondary | ICD-10-CM | POA: Diagnosis not present

## 2020-04-25 DIAGNOSIS — M9904 Segmental and somatic dysfunction of sacral region: Secondary | ICD-10-CM | POA: Diagnosis not present

## 2020-04-25 DIAGNOSIS — M4728 Other spondylosis with radiculopathy, sacral and sacrococcygeal region: Secondary | ICD-10-CM | POA: Diagnosis not present

## 2020-04-25 DIAGNOSIS — M9906 Segmental and somatic dysfunction of lower extremity: Secondary | ICD-10-CM | POA: Diagnosis not present

## 2020-04-25 DIAGNOSIS — M4727 Other spondylosis with radiculopathy, lumbosacral region: Secondary | ICD-10-CM | POA: Diagnosis not present

## 2020-04-25 DIAGNOSIS — M25561 Pain in right knee: Secondary | ICD-10-CM | POA: Diagnosis not present

## 2020-04-26 DIAGNOSIS — M4727 Other spondylosis with radiculopathy, lumbosacral region: Secondary | ICD-10-CM | POA: Diagnosis not present

## 2020-04-26 DIAGNOSIS — M9903 Segmental and somatic dysfunction of lumbar region: Secondary | ICD-10-CM | POA: Diagnosis not present

## 2020-04-26 DIAGNOSIS — M9904 Segmental and somatic dysfunction of sacral region: Secondary | ICD-10-CM | POA: Diagnosis not present

## 2020-04-26 DIAGNOSIS — M4728 Other spondylosis with radiculopathy, sacral and sacrococcygeal region: Secondary | ICD-10-CM | POA: Diagnosis not present

## 2020-04-26 DIAGNOSIS — M9906 Segmental and somatic dysfunction of lower extremity: Secondary | ICD-10-CM | POA: Diagnosis not present

## 2020-04-26 DIAGNOSIS — M25561 Pain in right knee: Secondary | ICD-10-CM | POA: Diagnosis not present

## 2020-04-27 ENCOUNTER — Ambulatory Visit (INDEPENDENT_AMBULATORY_CARE_PROVIDER_SITE_OTHER): Payer: Medicare Other | Admitting: Family Medicine

## 2020-04-27 ENCOUNTER — Encounter: Payer: Self-pay | Admitting: Family Medicine

## 2020-04-27 ENCOUNTER — Other Ambulatory Visit (HOSPITAL_COMMUNITY)
Admission: RE | Admit: 2020-04-27 | Discharge: 2020-04-27 | Disposition: A | Discharge status: Home / Self Care | Payer: Medicare Other | Source: Ambulatory Visit | Attending: Family Medicine | Admitting: Family Medicine

## 2020-04-27 VITALS — BP 117/46 | HR 78 | Ht 70.0 in | Wt 190.0 lb

## 2020-04-27 DIAGNOSIS — N760 Acute vaginitis: Secondary | ICD-10-CM | POA: Insufficient documentation

## 2020-04-27 DIAGNOSIS — Z1151 Encounter for screening for human papillomavirus (HPV): Secondary | ICD-10-CM | POA: Insufficient documentation

## 2020-04-27 DIAGNOSIS — E785 Hyperlipidemia, unspecified: Secondary | ICD-10-CM

## 2020-04-27 DIAGNOSIS — G43009 Migraine without aura, not intractable, without status migrainosus: Secondary | ICD-10-CM

## 2020-04-27 DIAGNOSIS — Z01419 Encounter for gynecological examination (general) (routine) without abnormal findings: Secondary | ICD-10-CM | POA: Insufficient documentation

## 2020-04-27 DIAGNOSIS — N950 Postmenopausal bleeding: Secondary | ICD-10-CM | POA: Diagnosis not present

## 2020-04-27 LAB — WET PREP FOR TRICH, YEAST, CLUE
MICRO NUMBER:: 11244186
Specimen Quality: ADEQUATE

## 2020-04-27 MED ORDER — FLUCONAZOLE 150 MG PO TABS: 150.0000 mg | ORAL_TABLET | Freq: Once | ORAL | 0 refills | Status: AC

## 2020-04-27 NOTE — Assessment & Plan Note (Signed)
Due to recheck lipids. 

## 2020-04-27 NOTE — Assessment & Plan Note (Signed)
Stable. Continue current regimen.  Imitrex PRN rescue.

## 2020-04-27 NOTE — Progress Notes (Signed)
Established Patient Office Visit  Subjective:  Patient ID: Lorraine Woods, female    DOB: Oct 17, 1945  Age: 74 y.o. MRN: [id number]  CC:  Chief Complaint  Patient presents with  . Migraine  . mood    HPI Lorraine Woods is a 74 yo post meno female who presents for some pink spotting.  Not sure if vaginal or from the urethral area.  She had actually sent [place] my chart in September saying she had just finished some antibiotics and started to get some vulvar itching.  Use some over-the-counter Monistat. The itching comes and goes since then.  She feels irritated at times. Hasn't noticed a rash.  No hx of hysterectomy  Wore her right knee brace all day over the weekend and then noticed a red warm spot at the edge of the brace. She took ASA and now it is better.     F/U Migraines - she is doing well overall. Still getting HA about 2 x per month and rescue med is helping.    Past Medical History:  Diagnosis Date  . Allergy    seasonal  . Anxiety   . Arthritis 2018  . Basal cell carcinoma    nose  . Cataract    not fully  . Depression   . Knee pain, chronic   . Liver cyst   . Migraine    possible 1 every 2 months  . Osteoporosis    osteopenia  . Wears glasses     Past Surgical History:  Procedure Laterality Date  . ANKLE FRACTURE SURGERY Left   . APPENDECTOMY  1959  . BREAST EXCISIONAL BIOPSY  2002   noncancerous lump  . BREAST LUMPECTOMY Right    on HRT, fibromadenoma  . CHOLECYSTECTOMY N/A 09/17/2013   Procedure: LAPAROSCOPIC CHOLECYSTECTOMY;  Surgeon: Stark Klein, MD;  Location: [facility];  Service: General;  Laterality: N/A;  . COLONOSCOPY    . [place], 2018  . LAPAROSCOPIC LIVER CYST UNROOFING N/A 09/17/2013   Procedure: LAPAROSCOPIC LIVER CYST UNROOFING;  Surgeon: Stark Klein, MD;  Location: [facility];  Service: General;  Laterality: N/A;  . ORIF ANKLE FRACTURE  2000   left  . sclerotherapy on left leg  2013  . SKIN CANCER EXCISION     basil cell lerion on  nose  . TUBAL LIGATION  1982    Family History  Problem Relation Age of Onset  . Brain cancer Mother 33  . Cancer Mother   . Diabetes Father   . Hypertension Father   . CAD Father   . Heart disease Father   . Colon polyps Son        Puetz-jehgers syndrome  . Colon cancer Neg Hx   . Esophageal cancer Neg Hx   . Rectal cancer Neg Hx   . Stomach cancer Neg Hx     Social History   Socioeconomic History  . Marital status: Married    Spouse name: Jenny Reichmann   . Number of children: 2  . Years of education: 77  . Highest education level: Associate degree: academic program  Occupational History  . 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 Smoking status: Never Smoker  . Smokeless tobacco: Never Used  Vaping Use  . Vaping Use: Never used  Substance and Sexual Activity  . Alcohol use: Yes    Alcohol/week: 1.0 standard drink    Types:  1 Glasses of wine per week    Comment: on special occasions only  . Drug use: No  . Sexual activity: Not Currently    Partners: Male    Birth control/protection: Post-menopausal  Other Topics Concern  . Not on file  Social History Narrative    Daily caffeine. 1 cup coffe in aM. Walks in her neighborhood for exercise. Has officially retired from working. Goes antique shopping with her husband.   Social Determinants of Health   Financial Resource Strain:   . Difficulty of Paying Living Expenses: Not on file  Food Insecurity:   . Worried About Charity fundraiser in the Last Year: Not on file  . Ran Out of Food in the Last Year: Not on file  Transportation Needs:   . Lack of Transportation (Medical): Not on file  . Lack of Transportation (Non-Medical): Not on file  Physical Activity: Insufficiently Active  . Days of Exercise per Week: 3 days  . Minutes of Exercise per Session: 20 min  Stress:   . Feeling of Stress : Not on file  Social Connections: Unknown  . Frequency of  Communication with Friends and Family: Not on file  . Frequency of Social Gatherings with Friends and Family: Not on file  . Attends Religious Services: Not on file  . Active Member of Clubs or Organizations: No  . Attends Archivist Meetings: Never  . Marital Status: Not on file  Intimate Partner Violence:   . Fear of Current or Ex-Partner: Not on file  . Emotionally Abused: Not on file  . Physically Abused: Not on file  . Sexually Abused: Not on file    Outpatient Medications Prior to Visit  Medication Sig Dispense Refill  . famotidine (PEPCID) 20 MG tablet Take 20 mg by mouth at bedtime.    . calcium gluconate (CALCIUM GLUCONATE) 100 mg/mL SOLN Take 500 mg by mouth daily.    . Cholecalciferol (VITAMIN D3) 2000 UNITS TABS Take 2,000 Units by mouth every morning.     Marland Kitchen ibuprofen (ADVIL,MOTRIN) 200 MG tablet Take 200 mg by mouth as needed.    Marland Kitchen ipratropium (ATROVENT) 0.03 % nasal spray SPRAY 2 SPRAYS INTO BOTH NOSTRILS EVERY 12 HOURS 30 mL 11  . minoxidil (ROGAINE) 2 % external solution Apply 2 (two) times daily topically.    . Multiple Vitamin (MULTIVITAMIN WITH MINERALS) TABS tablet Take 1 tablet by mouth every morning.    . sertraline (ZOLOFT) 100 MG tablet TAKE 1 TABLET BY MOUTH EVERY DAY IN THE MORNING 90 tablet 1  . SUMAtriptan (IMITREX) 100 MG tablet TAKE 1 TABLET BY MOUTH EVERY 2 HOURS AS NEEDED FOR MIGRAINE. MAY REPEAT IN 2 HOURS IF HEADACHE PERSISTS OR RECURS 9 tablet 6   No facility-administered medications prior to visit.    Allergies  Allergen Reactions  . Aspirin Other (See Comments)    Bruising.   . Excedrin Migraine [Aspirin-Acetaminophen-Caffeine] Other (See Comments)    Bruising     ROS Review of Systems    Objective:    Physical Exam Exam conducted with a chaperone present.  Constitutional:      Appearance: She is well-developed.  HENT:     Head: Normocephalic and atraumatic.  Cardiovascular:     Rate and Rhythm: Normal rate and regular  rhythm.     Heart sounds: Normal heart sounds.  Pulmonary:     Effort: Pulmonary effort is normal.     Breath sounds: Normal breath sounds.  Genitourinary:  Labia:        Right: No rash.        Left: No rash.      Cervix: Dilated. Friability present. No erythema.     Uterus: Normal.      Adnexa: Right adnexa normal and left adnexa normal.     Comments: Some vaginal irritation with thick white discharge and a little bit of fresh blood near the cervix. Skin:    General: Skin is warm and dry.  Neurological:     Mental Status: She is alert and oriented to person, place, and time.  Psychiatric:        Behavior: Behavior normal.     BP (!) 117/46   Pulse 78   Ht 5\' 10"  (1.778 m)   Wt 190 lb (86.2 kg)   SpO2 98%   BMI 27.26 kg/m  Wt Readings from Last 3 Encounters:  04/27/20 190 lb (86.2 kg)  10/26/19 187 lb (84.8 kg)  05/11/19 190 lb (86.2 kg)     There are no preventive care reminders to display for this patient.  There are no preventive care reminders to display for this patient.  Lab Results  Component Value Date   TSH 2.70 04/29/2017   Lab Results  Component Value Date   WBC 4.5 04/28/2019   HGB 14.7 04/28/2019   HCT 43.8 04/28/2019   MCV 86.9 04/28/2019   PLT 247 04/28/2019   Lab Results  Component Value Date   NA 142 04/28/2019   K 3.9 04/28/2019   CO2 29 04/28/2019   GLUCOSE 99 04/28/2019   BUN 16 04/28/2019   CREATININE 0.84 04/28/2019   BILITOT 0.7 04/28/2019   ALKPHOS 48 04/18/2016   AST 18 04/28/2019   ALT 13 04/28/2019   PROT 7.0 04/28/2019   ALBUMIN 4.3 04/18/2016   CALCIUM 9.3 04/28/2019   Lab Results  Component Value Date   CHOL 217 (H) 04/28/2019   Lab Results  Component Value Date   HDL 57 04/28/2019   Lab Results  Component Value Date   LDLCALC 135 (H) 04/28/2019   Lab Results  Component Value Date   TRIG 126 04/28/2019   Lab Results  Component Value Date   CHOLHDL 3.8 04/28/2019   Lab Results  Component Value Date    HGBA1C 5.3 04/18/2016      Assessment & Plan:   Problem List Items Addressed This Visit      Cardiovascular and Mediastinum   Migraine without aura, not refractory    Stable. Continue current regimen.  Imitrex PRN rescue.         Other   Hyperlipidemia - Primary    Due to recheck lipids      Relevant Orders   Lipid Panel w/reflex Direct LDL   COMPLETE METABOLIC PANEL WITH GFR    Other Visit Diagnoses    Acute vaginitis       Relevant Orders   WET PREP FOR [place], YEAST, CLUE   Cytology - PAP   Post-menopausal bleeding       Relevant Orders   Cytology - PAP     Postmenopausal bleeding-vaginal exam performed today she did have some irritation along the canal that could be causing some intermittent spotting.  She also had a little bit of blood at the cervix.  She had easy friability of the cervix with Pap smear.  Wet prep performed.  Will call with results once available and also refer to GYN for endometrial biopsy.  Postmenopausal bleeding.  Acute vaginitis-wet prep performed.  Will call with results.  Exam consistent with yeast vaginitis.  Meds ordered this encounter  Medications  . fluconazole (DIFLUCAN) 150 MG tablet    Sig: Take 1 tablet (150 mg total) by mouth once for 1 dose.    Dispense:  1 tablet    Refill:  0    Follow-up: Return in about 6 months (around 10/25/2020) for migraine,mood.   I spent 35 minutes on the day of the encounter to include pre-visit record review, face-to-face time with the patient and post visit ordering of test.   [name], MD

## 2020-04-28 LAB — COMPLETE METABOLIC PANEL WITH GFR
AG Ratio: 1.4 (calc) (ref 1.0–2.5)
ALT: 11 U/L (ref 6–29)
AST: 18 U/L (ref 10–35)
Albumin: 4.4 g/dL (ref 3.6–5.1)
Alkaline phosphatase (APISO): 45 U/L (ref 37–153)
BUN: 20 mg/dL (ref 7–25)
CO2: 29 mmol/L (ref 20–32)
Calcium: 9.7 mg/dL (ref 8.6–10.4)
Chloride: 105 mmol/L (ref 98–110)
Creat: 0.8 mg/dL (ref 0.60–0.93)
GFR, Est African American: 85 mL/min/{1.73_m2} (ref >=60)
GFR, Est Non African American: 73 mL/min/{1.73_m2} (ref >=60)
Globulin: 3.1 g/dL (calc) (ref 1.9–3.7)
Glucose, Bld: 108 mg/dL — ABNORMAL HIGH (ref 65–99)
Potassium: 4.5 mmol/L (ref 3.5–5.3)
Sodium: 144 mmol/L (ref 135–146)
Total Bilirubin: 0.7 mg/dL (ref 0.2–1.2)
Total Protein: 7.5 g/dL (ref 6.1–8.1)

## 2020-04-28 LAB — LIPID PANEL W/REFLEX DIRECT LDL
Cholesterol: 216 mg/dL — ABNORMAL HIGH (ref <200)
HDL: 58 mg/dL (ref >=50)
LDL Cholesterol (Calc): 134 mg/dL (calc) — ABNORMAL HIGH
Non-HDL Cholesterol (Calc): 158 mg/dL (calc) — ABNORMAL HIGH (ref <130)
Total CHOL/HDL Ratio: 3.7 (calc) (ref <5.0)
Triglycerides: 126 mg/dL (ref <150)

## 2020-05-02 ENCOUNTER — Telehealth: Payer: Self-pay

## 2020-05-02 DIAGNOSIS — M9906 Segmental and somatic dysfunction of lower extremity: Secondary | ICD-10-CM | POA: Diagnosis not present

## 2020-05-02 DIAGNOSIS — M4727 Other spondylosis with radiculopathy, lumbosacral region: Secondary | ICD-10-CM | POA: Diagnosis not present

## 2020-05-02 DIAGNOSIS — N950 Postmenopausal bleeding: Secondary | ICD-10-CM

## 2020-05-02 DIAGNOSIS — M9903 Segmental and somatic dysfunction of lumbar region: Secondary | ICD-10-CM | POA: Diagnosis not present

## 2020-05-02 DIAGNOSIS — M9904 Segmental and somatic dysfunction of sacral region: Secondary | ICD-10-CM | POA: Diagnosis not present

## 2020-05-02 DIAGNOSIS — M4728 Other spondylosis with radiculopathy, sacral and sacrococcygeal region: Secondary | ICD-10-CM | POA: Diagnosis not present

## 2020-05-02 DIAGNOSIS — M25561 Pain in right knee: Secondary | ICD-10-CM | POA: Diagnosis not present

## 2020-05-02 NOTE — Telephone Encounter (Signed)
Patient advised of recommendations.  

## 2020-05-02 NOTE — Telephone Encounter (Signed)
Urgent referral placed with GYN.  Lets see if we can try to get her in this week if at all possible.  I apologize I meant to place the referral before the holiday weekend that they likely would not have gotten to until today.  Also went ahead and scheduled her for pelvic ultrasound for further work-up.

## 2020-05-02 NOTE — Telephone Encounter (Signed)
Lorraine Woods called and left a message stating she is still having bloody vaginal discharge. I did call her back requesting a return call.

## 2020-05-03 DIAGNOSIS — M25561 Pain in right knee: Secondary | ICD-10-CM | POA: Diagnosis not present

## 2020-05-03 DIAGNOSIS — M4728 Other spondylosis with radiculopathy, sacral and sacrococcygeal region: Secondary | ICD-10-CM | POA: Diagnosis not present

## 2020-05-03 DIAGNOSIS — M4727 Other spondylosis with radiculopathy, lumbosacral region: Secondary | ICD-10-CM | POA: Diagnosis not present

## 2020-05-03 DIAGNOSIS — M9903 Segmental and somatic dysfunction of lumbar region: Secondary | ICD-10-CM | POA: Diagnosis not present

## 2020-05-03 DIAGNOSIS — M9904 Segmental and somatic dysfunction of sacral region: Secondary | ICD-10-CM | POA: Diagnosis not present

## 2020-05-03 DIAGNOSIS — M9906 Segmental and somatic dysfunction of lower extremity: Secondary | ICD-10-CM | POA: Diagnosis not present

## 2020-05-04 LAB — CYTOLOGY - PAP
Comment: NEGATIVE
Diagnosis: NEGATIVE
High risk HPV: NEGATIVE

## 2020-05-04 NOTE — Progress Notes (Signed)
Call patient: Your Pap smear is normal.

## 2020-05-05 ENCOUNTER — Other Ambulatory Visit: Payer: Self-pay

## 2020-05-05 ENCOUNTER — Telehealth: Payer: Self-pay | Admitting: *Deleted

## 2020-05-05 ENCOUNTER — Ambulatory Visit (INDEPENDENT_AMBULATORY_CARE_PROVIDER_SITE_OTHER): Payer: Medicare Other

## 2020-05-05 DIAGNOSIS — R9389 Abnormal findings on diagnostic imaging of other specified body structures: Secondary | ICD-10-CM | POA: Diagnosis not present

## 2020-05-05 DIAGNOSIS — N950 Postmenopausal bleeding: Secondary | ICD-10-CM

## 2020-05-05 DIAGNOSIS — N858 Other specified noninflammatory disorders of uterus: Secondary | ICD-10-CM | POA: Diagnosis not present

## 2020-05-05 DIAGNOSIS — N854 Malposition of uterus: Secondary | ICD-10-CM | POA: Diagnosis not present

## 2020-05-05 MED ORDER — MISOPROSTOL 200 MCG PO TABS: ORAL_TABLET | ORAL | 0 refills | Status: DC

## 2020-05-05 NOTE — Telephone Encounter (Signed)
-----   Message from Guss Bunde, MD sent at 05/05/2020  3:42 PM EST ----- Can you make sure she has cytotec before her appt on December 13th.  Her [place] looks worrisome and I want to make sure I get in.

## 2020-05-05 NOTE — Telephone Encounter (Signed)
Pt notified that a RX of Cytotec was sent to [facility] in [place] per Dr Gala Romney.  She is to take the Cytotec the morning of her Endometrial biopsy.  Explained to pt that this may cause some pelvic cramping and that she should take Ibuprofen if she has cramping and to take it 1 hour prior to her biopsy.

## 2020-05-09 ENCOUNTER — Encounter: Payer: Self-pay | Admitting: Family Medicine

## 2020-05-09 DIAGNOSIS — R935 Abnormal findings on diagnostic imaging of other abdominal regions, including retroperitoneum: Secondary | ICD-10-CM

## 2020-05-16 ENCOUNTER — Ambulatory Visit (INDEPENDENT_AMBULATORY_CARE_PROVIDER_SITE_OTHER): Payer: Medicare Other | Admitting: Obstetrics & Gynecology

## 2020-05-16 ENCOUNTER — Encounter: Payer: Self-pay | Admitting: Obstetrics & Gynecology

## 2020-05-16 ENCOUNTER — Other Ambulatory Visit: Payer: Self-pay

## 2020-05-16 ENCOUNTER — Other Ambulatory Visit: Payer: Self-pay | Admitting: Obstetrics & Gynecology

## 2020-05-16 VITALS — BP 131/74 | HR 70 | Resp 16 | Ht 70.0 in | Wt 191.0 lb

## 2020-05-16 DIAGNOSIS — N950 Postmenopausal bleeding: Secondary | ICD-10-CM | POA: Diagnosis not present

## 2020-05-16 DIAGNOSIS — N840 Polyp of corpus uteri: Secondary | ICD-10-CM | POA: Diagnosis not present

## 2020-05-16 NOTE — Progress Notes (Signed)
° °  Subjective:    Patient ID: Lorraine Woods, female    DOB: 11/28/1945, 74 y.o.   MRN: [id number]  HPI  74 yo female had bleeding just before thanksgiving.  Patient had ultrasound which showed findings worrisome for endometrial carcinoma.  Details of ultrasound can be found under the imaging tab.  Patient not bleeding currently.  She has had some yellowish discharge.  Patient has 2 benign cyst on her labia.  She has had her dermatologist look at them.  She would also like them looked at today.  Review of Systems  Constitutional: Negative.   Respiratory: Negative.   Cardiovascular: Negative.   Gastrointestinal: Negative.   Genitourinary: Negative.        Objective:   Physical Exam Vitals reviewed.  Constitutional:      General: She is not in acute distress.    Appearance: She is well-developed and well-nourished.  HENT:     Head: Normocephalic and atraumatic.  Eyes:     Conjunctiva/sclera: Conjunctivae normal.  Cardiovascular:     Rate and Rhythm: Normal rate.  Pulmonary:     Effort: Pulmonary effort is normal.  Abdominal:     General: There is no distension.     Palpations: Abdomen is soft. There is no mass.     Tenderness: There is no abdominal tenderness. There is no guarding or rebound.  Genitourinary:    Comments: Tanner V Vulva:  Two sebaceous cysts Vagina:  Pink, no lesions, no discharge, no blood Cervix:  No CMT Uterus:  Non tender, mobile Right adnexa--non tender, no mass Left adnexa--non tender, no mass    Musculoskeletal:        General: No edema.  Skin:    General: Skin is warm and dry.  Neurological:     Mental Status: She is alert and oriented to person, place, and time.  Psychiatric:        Mood and Affect: Mood and affect normal.    Vitals:   05/16/20 1546  BP: 131/74  Pulse: 70  Resp: 16  Weight: 191 lb (86.6 kg)  Height: 5\' 10"  (1.778 m)    Assessment & Plan:  74 yo female with PMB and [place] concerning for endometrial carcinoma  1.   Discussion with patient about potential diagnosis. 2.  Pt understands that results could be released to MyChart before she speaks with me. 3.  Patient has diagnosis of cancer, she would like to be referred to [facility].  She used to work there.  ENDOMETRIAL BIOPSY     The indications for endometrial biopsy were reviewed.   Risks of the biopsy including cramping, bleeding, infection, uterine perforation, inadequate specimen and need for additional procedures  were discussed. The patient states she understands and agrees to undergo procedure today. Consent was signed. Time out was performed. A sterile speculum was placed in the patient's vagina and the cervix was prepped with Betadine. A single-toothed tenaculum was placed on the anterior lip of the cervix to stabilize it. The 3 mm pipelle was introduced into the endometrial cavity without difficulty to a depth of 9cm, and a moderate amount of tissue was obtained and sent to pathology. The instruments were removed from the patient's vagina. Minimal bleeding from the cervix was noted. The patient tolerated the procedure well. Routine post-procedure instructions were given to the patient. The patient will follow up to review the results and for further management.

## 2020-05-26 ENCOUNTER — Ambulatory Visit: Payer: Medicare Other | Admitting: Obstetrics and Gynecology

## 2020-05-30 DIAGNOSIS — R9389 Abnormal findings on diagnostic imaging of other specified body structures: Secondary | ICD-10-CM | POA: Diagnosis not present

## 2020-05-30 DIAGNOSIS — N950 Postmenopausal bleeding: Secondary | ICD-10-CM | POA: Diagnosis not present

## 2020-06-02 ENCOUNTER — Other Ambulatory Visit: Payer: Self-pay | Admitting: Family Medicine

## 2020-06-02 DIAGNOSIS — G43009 Migraine without aura, not intractable, without status migrainosus: Secondary | ICD-10-CM

## 2020-06-11 ENCOUNTER — Other Ambulatory Visit (HOSPITAL_COMMUNITY)
Admission: RE | Admit: 2020-06-11 | Discharge: 2020-06-11 | Disposition: A | Discharge status: Home / Self Care | Payer: Medicare Other | Source: Ambulatory Visit | Attending: Obstetrics and Gynecology | Admitting: Obstetrics and Gynecology

## 2020-06-11 DIAGNOSIS — Z20822 Contact with and (suspected) exposure to covid-19: Secondary | ICD-10-CM | POA: Insufficient documentation

## 2020-06-11 DIAGNOSIS — Z01812 Encounter for preprocedural laboratory examination: Secondary | ICD-10-CM | POA: Diagnosis not present

## 2020-06-12 LAB — SARS CORONAVIRUS 2 (TAT 6-24 HRS): SARS Coronavirus 2: NEGATIVE

## 2020-06-14 ENCOUNTER — Encounter (HOSPITAL_BASED_OUTPATIENT_CLINIC_OR_DEPARTMENT_OTHER): Payer: Self-pay | Admitting: Obstetrics and Gynecology

## 2020-06-14 ENCOUNTER — Other Ambulatory Visit: Payer: Self-pay | Admitting: Obstetrics and Gynecology

## 2020-06-14 ENCOUNTER — Other Ambulatory Visit: Payer: Self-pay

## 2020-06-14 NOTE — Progress Notes (Signed)
Spoke w/ via phone for pre-op interview---PT Lab needs dos---- no              Lab results------ no COVID test ------ done 06-11-2020 negative result in epic Arrive at ------- 0900 NPO after MN NO Solid Food.  Clear liquids from MN until--- 0800 Medications to take morning of surgery ----- Zoloft, Atrovent nasal spray Diabetic medication ----- n/a Patient Special Instructions ----- n/a Pre-Op special Istructions ----- n/a Patient verbalized understanding of instructions that were given at this phone interview. Patient denies shortness of breath, chest pain, fever, cough at this phone interview.

## 2020-06-15 ENCOUNTER — Other Ambulatory Visit: Payer: Self-pay

## 2020-06-15 ENCOUNTER — Ambulatory Visit (HOSPITAL_BASED_OUTPATIENT_CLINIC_OR_DEPARTMENT_OTHER): Payer: Medicare Other | Admitting: Anesthesiology

## 2020-06-15 ENCOUNTER — Ambulatory Visit (HOSPITAL_BASED_OUTPATIENT_CLINIC_OR_DEPARTMENT_OTHER)
Admission: RE | Admit: 2020-06-15 | Discharge: 2020-06-15 | Disposition: A | Discharge status: Home / Self Care | Payer: Medicare Other | Attending: Obstetrics and Gynecology | Admitting: Obstetrics and Gynecology

## 2020-06-15 ENCOUNTER — Encounter (HOSPITAL_BASED_OUTPATIENT_CLINIC_OR_DEPARTMENT_OTHER)
Admission: RE | Disposition: A | Discharge status: Home / Self Care | Payer: Self-pay | Source: Home / Self Care | Attending: Obstetrics and Gynecology

## 2020-06-15 ENCOUNTER — Encounter (HOSPITAL_BASED_OUTPATIENT_CLINIC_OR_DEPARTMENT_OTHER): Payer: Self-pay | Admitting: Obstetrics and Gynecology

## 2020-06-15 DIAGNOSIS — N841 Polyp of cervix uteri: Secondary | ICD-10-CM | POA: Diagnosis not present

## 2020-06-15 DIAGNOSIS — Z886 Allergy status to analgesic agent status: Secondary | ICD-10-CM | POA: Insufficient documentation

## 2020-06-15 DIAGNOSIS — Z79899 Other long term (current) drug therapy: Secondary | ICD-10-CM | POA: Insufficient documentation

## 2020-06-15 DIAGNOSIS — E785 Hyperlipidemia, unspecified: Secondary | ICD-10-CM | POA: Diagnosis not present

## 2020-06-15 DIAGNOSIS — Z85828 Personal history of other malignant neoplasm of skin: Secondary | ICD-10-CM | POA: Diagnosis not present

## 2020-06-15 DIAGNOSIS — N72 Inflammatory disease of cervix uteri: Secondary | ICD-10-CM | POA: Diagnosis not present

## 2020-06-15 DIAGNOSIS — N840 Polyp of corpus uteri: Secondary | ICD-10-CM | POA: Diagnosis not present

## 2020-06-15 DIAGNOSIS — Z8249 Family history of ischemic heart disease and other diseases of the circulatory system: Secondary | ICD-10-CM | POA: Diagnosis not present

## 2020-06-15 DIAGNOSIS — Z791 Long term (current) use of non-steroidal anti-inflammatories (NSAID): Secondary | ICD-10-CM | POA: Insufficient documentation

## 2020-06-15 DIAGNOSIS — Z809 Family history of malignant neoplasm, unspecified: Secondary | ICD-10-CM | POA: Diagnosis not present

## 2020-06-15 DIAGNOSIS — R9389 Abnormal findings on diagnostic imaging of other specified body structures: Secondary | ICD-10-CM | POA: Insufficient documentation

## 2020-06-15 DIAGNOSIS — E559 Vitamin D deficiency, unspecified: Secondary | ICD-10-CM | POA: Diagnosis not present

## 2020-06-15 DIAGNOSIS — Z808 Family history of malignant neoplasm of other organs or systems: Secondary | ICD-10-CM | POA: Diagnosis not present

## 2020-06-15 DIAGNOSIS — N950 Postmenopausal bleeding: Secondary | ICD-10-CM | POA: Insufficient documentation

## 2020-06-15 DIAGNOSIS — Z833 Family history of diabetes mellitus: Secondary | ICD-10-CM | POA: Insufficient documentation

## 2020-06-15 DIAGNOSIS — Z8371 Family history of colonic polyps: Secondary | ICD-10-CM | POA: Diagnosis not present

## 2020-06-15 DIAGNOSIS — F418 Other specified anxiety disorders: Secondary | ICD-10-CM | POA: Diagnosis not present

## 2020-06-15 HISTORY — PX: DILATATION & CURETTAGE/HYSTEROSCOPY WITH MYOSURE: SHX6511

## 2020-06-15 HISTORY — DX: Other specified postprocedural states: Z98.890

## 2020-06-15 HISTORY — DX: Generalized anxiety disorder: F41.1

## 2020-06-15 HISTORY — DX: Hyperlipidemia, unspecified: E78.5

## 2020-06-15 HISTORY — DX: Other specified disorders of bone density and structure, unspecified site: M85.80

## 2020-06-15 HISTORY — DX: Postmenopausal bleeding: N95.0

## 2020-06-15 HISTORY — DX: Personal history of other malignant neoplasm of skin: Z85.828

## 2020-06-15 HISTORY — DX: Other seasonal allergic rhinitis: J30.2

## 2020-06-15 HISTORY — DX: Abnormal findings on diagnostic imaging of other specified body structures: R93.89

## 2020-06-15 LAB — CBC
HCT: 44 % (ref 36.0–46.0)
Hemoglobin: 14.9 g/dL (ref 12.0–15.0)
MCH: 29.7 pg (ref 26.0–34.0)
MCHC: 33.9 g/dL (ref 30.0–36.0)
MCV: 87.8 fL (ref 80.0–100.0)
Platelets: 240 K/uL (ref 150–400)
RBC: 5.01 MIL/uL (ref 3.87–5.11)
RDW: 12.4 % (ref 11.5–15.5)
WBC: 5.1 K/uL (ref 4.0–10.5)
nRBC: 0 % (ref 0.0–0.2)

## 2020-06-15 SURGERY — DILATATION & CURETTAGE/HYSTEROSCOPY WITH MYOSURE
Anesthesia: General | Site: Uterus

## 2020-06-15 MED ORDER — FENTANYL CITRATE (PF) 100 MCG/2ML IJ SOLN
INTRAMUSCULAR | Status: DC | PRN
  Administered 2020-06-15 (×2): 50 ug via INTRAVENOUS

## 2020-06-15 MED ORDER — LIDOCAINE HCL (PF) 2 % IJ SOLN
INTRAMUSCULAR | Status: AC
  Filled 2020-06-15: qty 5

## 2020-06-15 MED ORDER — LACTATED RINGERS IV SOLN: INTRAVENOUS | Status: DC

## 2020-06-15 MED ORDER — DEXAMETHASONE SODIUM PHOSPHATE 10 MG/ML IJ SOLN
INTRAMUSCULAR | Status: DC | PRN
  Administered 2020-06-15: 4 mg via INTRAVENOUS

## 2020-06-15 MED ORDER — FENTANYL CITRATE (PF) 100 MCG/2ML IJ SOLN: 25.0000 ug | INTRAMUSCULAR | Status: DC | PRN

## 2020-06-15 MED ORDER — ACETAMINOPHEN 500 MG PO TABS
1000.0000 mg | ORAL_TABLET | Freq: Once | ORAL | Status: AC
  Administered 2020-06-15: 1000 mg via ORAL

## 2020-06-15 MED ORDER — PROPOFOL 10 MG/ML IV BOLUS
INTRAVENOUS | Status: AC
  Filled 2020-06-15: qty 20

## 2020-06-15 MED ORDER — LIDOCAINE 2% (20 MG/ML) 5 ML SYRINGE
INTRAMUSCULAR | Status: DC | PRN
  Administered 2020-06-15: 60 mg via INTRAVENOUS

## 2020-06-15 MED ORDER — POVIDONE-IODINE 10 % EX SWAB: 2.0000 "application " | Freq: Once | CUTANEOUS | Status: DC

## 2020-06-15 MED ORDER — ONDANSETRON HCL 4 MG/2ML IJ SOLN
INTRAMUSCULAR | Status: AC
  Filled 2020-06-15: qty 2

## 2020-06-15 MED ORDER — SODIUM CHLORIDE 0.9 % IR SOLN
Status: DC | PRN
  Administered 2020-06-15: 6000 mL

## 2020-06-15 MED ORDER — PROPOFOL 10 MG/ML IV BOLUS
INTRAVENOUS | Status: DC | PRN
Start: 2020-06-15 — End: 2020-06-15
  Administered 2020-06-15: 200 mg via INTRAVENOUS

## 2020-06-15 MED ORDER — ONDANSETRON HCL 4 MG/2ML IJ SOLN
INTRAMUSCULAR | Status: DC | PRN
  Administered 2020-06-15: 4 mg via INTRAVENOUS

## 2020-06-15 MED ORDER — KETOROLAC TROMETHAMINE 30 MG/ML IJ SOLN
INTRAMUSCULAR | Status: AC
  Filled 2020-06-15: qty 1

## 2020-06-15 MED ORDER — ACETAMINOPHEN 500 MG PO TABS
ORAL_TABLET | ORAL | Status: AC
  Filled 2020-06-15: qty 2

## 2020-06-15 MED ORDER — FENTANYL CITRATE (PF) 100 MCG/2ML IJ SOLN
INTRAMUSCULAR | Status: AC
  Filled 2020-06-15: qty 2

## 2020-06-15 MED ORDER — DEXAMETHASONE SODIUM PHOSPHATE 10 MG/ML IJ SOLN
INTRAMUSCULAR | Status: AC
  Filled 2020-06-15: qty 1

## 2020-06-15 MED ORDER — LIDOCAINE HCL 1 % IJ SOLN
INTRAMUSCULAR | Status: DC | PRN
  Administered 2020-06-15: 10 mL

## 2020-06-15 SURGICAL SUPPLY — 18 items
CANISTER SUCT 3000ML PPV (MISCELLANEOUS) ×2 IMPLANT
CATH ROBINSON RED A/P 16FR (CATHETERS) ×2 IMPLANT
DEVICE MYOSURE LITE (MISCELLANEOUS) IMPLANT
DEVICE MYOSURE REACH (MISCELLANEOUS) ×2 IMPLANT
GLOVE ECLIPSE 6.5 STRL STRAW (GLOVE) ×2 IMPLANT
GLOVE SURG UNDER POLY LF SZ7 (GLOVE) ×4 IMPLANT
GOWN STRL REUS W/ TWL LRG LVL3 (GOWN DISPOSABLE) ×2 IMPLANT
GOWN STRL REUS W/TWL LRG LVL3 (GOWN DISPOSABLE) ×4
IV NS IRRIG 3000ML ARTHROMATIC (IV SOLUTION) ×4 IMPLANT
KIT PROCEDURE FLUENT (KITS) ×2 IMPLANT
KIT TURNOVER CYSTO (KITS) ×2 IMPLANT
PACK VAGINAL MINOR WOMEN LF (CUSTOM PROCEDURE TRAY) ×2 IMPLANT
PAD OB MATERNITY 4.3X12.25 (PERSONAL CARE ITEMS) ×2 IMPLANT
SEAL CERVICAL OMNI LOK (ABLATOR) ×2 IMPLANT
SEAL ROD LENS SCOPE MYOSURE (ABLATOR) ×2 IMPLANT
SYR CONTROL 10ML LL (SYRINGE) ×2 IMPLANT
TOWEL OR 17X26 10 PK STRL BLUE (TOWEL DISPOSABLE) ×4 IMPLANT
UNDERPAD 30X36 HEAVY ABSORB (UNDERPADS AND DIAPERS) ×2 IMPLANT

## 2020-06-15 NOTE — Anesthesia Preprocedure Evaluation (Addendum)
Anesthesia Evaluation  Patient identified by MRN, date of birth, ID band Patient awake    Reviewed: Allergy & Precautions, NPO status , Patient's Chart, lab work & pertinent test results  Airway Mallampati: I  TM Distance: >3 FB Neck ROM: Full    Dental no notable dental hx. (+) Teeth Intact, Dental Advisory Given   Pulmonary neg pulmonary ROS,    Pulmonary exam normal breath sounds clear to auscultation       Cardiovascular negative cardio ROS Normal cardiovascular exam Rhythm:Regular Rate:Normal     Neuro/Psych  Headaches, PSYCHIATRIC DISORDERS Anxiety Depression    GI/Hepatic Neg liver ROS, GERD  Medicated and Controlled,  Endo/Other  negative endocrine ROS  Renal/GU negative Renal ROS  negative genitourinary   Musculoskeletal  (+) Arthritis ,   Abdominal   Peds  Hematology negative hematology ROS (+)   Anesthesia Other Findings   Reproductive/Obstetrics                            Anesthesia Physical Anesthesia Plan  ASA: II  Anesthesia Plan: General   Post-op Pain Management:    Induction: Intravenous  PONV Risk Score and Plan: 3 and Ondansetron, Dexamethasone and Midazolam  Airway Management Planned: LMA  Additional Equipment:   Intra-op Plan:   Post-operative Plan: Extubation in OR  Informed Consent: I have reviewed the patients History and Physical, chart, labs and discussed the procedure including the risks, benefits and alternatives for the proposed anesthesia with the patient or authorized representative who has indicated his/her understanding and acceptance.     Dental advisory given  Plan Discussed with: CRNA  Anesthesia Plan Comments:         Anesthesia Quick Evaluation

## 2020-06-15 NOTE — Anesthesia Postprocedure Evaluation (Signed)
Anesthesia Post Note  Patient: Lorraine Woods  Procedure(s) Performed: DILATATION & CURETTAGE/HYSTEROSCOPY WITH MYOSURE (N/A Uterus)     Patient location during evaluation: PACU Anesthesia Type: General Level of consciousness: awake and alert Pain management: pain level controlled Vital Signs Assessment: post-procedure vital signs reviewed and stable Respiratory status: spontaneous breathing, nonlabored ventilation, respiratory function stable and patient connected to nasal cannula oxygen Cardiovascular status: blood pressure returned to baseline and stable Postop Assessment: no apparent nausea or vomiting Anesthetic complications: no   No complications documented.  Last Vitals:  Vitals:   06/15/20 1215 06/15/20 1250  BP: 133/77 135/71  Pulse: 67 63  Resp: 13 16  Temp: 36.5 C 36.4 C  SpO2: 99% 95%    Last Pain:  Vitals:   06/15/20 1250  TempSrc: Oral  PainSc: 1                  Fergus Throne L Glorian Mcdonell

## 2020-06-15 NOTE — Discharge Instructions (Signed)
DISCHARGE INSTRUCTIONS: D&C / D&E The following instructions have been prepared to help you care for yourself upon your return home.   Personal hygiene:  Use sanitary pads for vaginal drainage, not tampons.  Shower the day after your procedure.  NO tub baths, pools or Jacuzzis for 2-3 weeks.  Wipe front to back after using the bathroom.  Activity and limitations:  Do NOT drive or operate any equipment for 24 hours. The effects of anesthesia are still present and drowsiness may result.  Do NOT rest in bed all day.  Walking is encouraged.  Walk up and down stairs slowly.  You may resume your normal activity in one to two days or as indicated by your physician.  Sexual activity: NO intercourse for at least 2 weeks after the procedure, or as indicated by your physician.  Diet: Eat a light meal as desired this evening. You may resume your usual diet tomorrow.  Return to work: You may resume your work activities in one to two days or as indicated by your doctor.  What to expect after your surgery: Expect to have vaginal bleeding/discharge for 2-3 days and spotting for up to 10 days. It is not unusual to have soreness for up to 1-2 weeks. You may have a slight burning sensation when you urinate for the first day. Mild cramps may continue for a couple of days. You may have a regular period in 2-6 weeks.  Call your doctor for any of the following:  Excessive vaginal bleeding, saturating and changing one pad every hour.  Inability to urinate 6 hours after discharge from hospital.  Pain not relieved by pain medication.  Fever of 100.4 F or greater.  Unusual vaginal discharge or odor.  Post Anesthesia Home Care Instructions  Activity: Get plenty of rest for the remainder of the day. A responsible individual must stay with you for 24 hours following the procedure.  For the next 24 hours, DO NOT: -Drive a car -Paediatric nurse -Drink alcoholic beverages -Take any medication  unless instructed by your physician -Make any legal decisions or sign important papers.  Meals: Start with liquid foods such as gelatin or soup. Progress to regular foods as tolerated. Avoid greasy, spicy, heavy foods. If nausea and/or vomiting occur, drink only clear liquids until the nausea and/or vomiting subsides. Call your physician if vomiting continues.  Special Instructions/Symptoms: Your throat may feel dry or sore from the anesthesia or the breathing tube placed in your throat during surgery. If this causes discomfort, gargle with warm salt water. The discomfort should disappear within 24 hours.  No additional Tylenol/acetaminophen products until after 3:30 pm today if needed.

## 2020-06-15 NOTE — Op Note (Signed)
[facility] Apr 09, 1946 [id number]   Operative Note  PROCEDURE: operative hysteroscopy, polypectomy, dilation and curettage   PRE-OPERATIVE DIAGNOSIS: PMB, thickened endometrium  POST-OPERATIVE DIAGNOSIS: PMB, endometrial polyps  SURGEON: Dr. Langley Gauss, DO  ASSISTANT: N/A  FINDINGS: normal appearing bilateral vulva with benign appearing sebaceous cyst on left side, atrophic vaginal epithelium, normal appearing ectocervix without lesions, endocervical polyp protruding through the cervical canal, uterine cavity with several large polyps obscuring the cavity view, once resected normal appearing endometrial cavity with bilateral tubal ostia visualized  SPECIMENS: endocervical polyp, endometrial polyps, endometrial curettings  EBL: 10 cc  FLUIDS: 700 cc crystalloids,  Deficit 45 mL  COMPLICATIONS: None  PROCEDURE IN DETAIL:   After the patient was appropriately consented in the holding area, she was taken to the operating room where general anesthesia was administered without complications. The patient was placed in the dorsal lithotomy position. The patient was prepped and draped in the usual sterile fashion. The bladder was trained with a catheter. An appropriate time out was performed that verified the correct patient, procedure, and surgical team.   A sterile speculum was inserted into the vagina and the cervix was visualized. A single tooth tenaculum was used to grasp the anterior lip of the cervix. The endocervical polyp was grasped with a polyp forcep and sent for final pathology. A paracervical block was obtained using 1% plain lidocaine injecting a total of 10 cc divided between the 4 and 8 o'clock positions. The cervix was noted to be stenotic and an os finder used to assist in dilation. The cervix was sequentially dilated up to a size 19 french. The hysteroscopy was introduced through the cervix and advanced to the uterine fundus. The uterine cavity distension was obtained  without difficulty and the findings were noted as described above. The MyoSure 'Reach' was utilized to resect the polyps under direct visualization. The hysteroscopy was removed. A gentle sharp curetting was performed and specimens collected on a Telfa. All specimens were sent for final pathology. The tenaculum was removed and excellent hemostasis was appreciated. Speculum was removed. Patient tolerated the procedure well and was taken to the recovery room in stable condition. All instrument and lap counts were correct.  Clarann Helvey A Ramie Erman 06/15/20 12:12 PM

## 2020-06-15 NOTE — Transfer of Care (Signed)
Immediate Anesthesia Transfer of Care Note  Patient: Lorraine Woods  Procedure(s) Performed: Procedure(s) (LRB): DILATATION & CURETTAGE/HYSTEROSCOPY WITH MYOSURE (N/A)  Patient Location: PACU  Anesthesia Type: General  Level of Consciousness: awake, alert  and oriented  Airway & Oxygen Therapy: Patient Spontanous Breathing and Patient connected to nasal cannula oxygen  Post-op Assessment: Report given to PACU RN and Post -op Vital signs reviewed and stable  Post vital signs: Reviewed and stable  Complications: No apparent anesthesia complications  Last Vitals:  Vitals Value Taken Time  BP 156/67 06/15/20 1153  Temp 36.3 C 06/15/20 1153  Pulse 75 06/15/20 1155  Resp 11 06/15/20 1155  SpO2 96 % 06/15/20 1155  Vitals shown include unvalidated device data.  Last Pain:  Vitals:   06/15/20 0926  TempSrc: Oral  PainSc: 0-No pain      Patients Stated Pain Goal: 6 (66/44/03 4742)  Complications: No complications documented.

## 2020-06-15 NOTE — Anesthesia Procedure Notes (Signed)
Procedure Name: LMA Insertion Date/Time: 06/15/2020 11:08 AM Performed by: Mechele Claude, CRNA Pre-anesthesia Checklist: Patient identified, Emergency Drugs available, Suction available and Patient being monitored Patient Re-evaluated:Patient Re-evaluated prior to induction Oxygen Delivery Method: Circle system utilized Preoxygenation: Pre-oxygenation with 100% oxygen Induction Type: IV induction Ventilation: Mask ventilation without difficulty LMA: LMA inserted LMA Size: 4.0 Number of attempts: 1 Airway Equipment and Method: Bite block Placement Confirmation: positive ETCO2 Tube secured with: Tape Dental Injury: Teeth and Oropharynx as per pre-operative assessment

## 2020-06-15 NOTE — H&P (Signed)
Lorraine Woods is an 75 y.o. female.   11Y P2 postmenopausal female presents for scheduled surgery with operative hysteroscopy, D&C for PMB  Patient reports menopause around the age of 34, had not had any VB until recently. Spotting the day before thanksgiving that lasted a couple of days, none since then. Was originally seen by PCP for these complaints and had US done which showed the following: -Retroverted uterus measuring 8.0x4.8x5.9 cm -Endometrium thickness 26 mm with markedly thickened abnormal appearance with a single tiny calcification in several small cysts identified. A more hypoechoic region is seen at the LUS portion of endometrial canal 2.0 x 1.5 x 1.4 cm question blood. Significant blood flow is seen within the thickened endometrium on color Doppler imaging. Findings are concerning for endometrial malignancy  Patient then referred to OB/GYN where she had an EMB done on 05/16/20 with final pathology as follows: -Benign endometrial polyp -Benign endometrium with tubal metaplasia -No hyperplasia or malignancy identified  Patient concerned about US findings and risk of cancer and had been consented for operative hysteroscopy with D&C for a more thorough evaluation. Paps have always been normal  No blood thinners.  Had recently started Famotidine, but no other changes to medications  PMH significant for migraines, GERD, and depression. Denies any HTN, cardiac disease, or DM   Menstrual History:  No LMP recorded. Patient is postmenopausal.    Past Medical History:  Diagnosis Date  . Arthritis   . Depression   . GAD (generalized anxiety disorder)   . History of basal cell carcinoma (BCC) excision    nose  x2  last one 2019  . Hyperlipidemia   . Knee pain, chronic   . Migraine   . Osteopenia   . PMB (postmenopausal bleeding)   . Seasonal allergies   . Thickened endometrium   . Wears glasses     Past Surgical History:  Procedure Laterality Date  . APPENDECTOMY  57    age 43  . BREAST LUMPECTOMY Right 2002   on HRT, fibromadenoma  . CHOLECYSTECTOMY N/A 09/17/2013   Procedure: LAPAROSCOPIC CHOLECYSTECTOMY;  Surgeon: Stark Klein, MD;  Location: [facility];  Service: General;  Laterality: N/A;  . COLONOSCOPY WITH PROPOFOL  last one 06-28-2017  dr Havery Moros  . LAPAROSCOPIC LIVER CYST UNROOFING N/A 09/17/2013   Procedure: LAPAROSCOPIC LIVER CYST UNROOFING;  Surgeon: Stark Klein, MD;  Location: [facility];  Service: General;  Laterality: N/A;  . MOHS SURGERY  x2  last one approx. 2019   bcc of nose  . ORIF ANKLE FRACTURE Left 2000  . ORIF WRIST FRACTURE Right 10-30-2016  @[facility]   . sclerotherapy on left leg  2013  . TUBAL LIGATION  1982    Family History  Problem Relation Age of Onset  . Brain cancer Mother 47  . Cancer Mother   . Diabetes Father   . Hypertension Father   . CAD Father   . Heart disease Father   . Colon polyps Son        Puetz-jehgers syndrome  . Colon cancer Neg Hx   . Esophageal cancer Neg Hx   . Rectal cancer Neg Hx   . Stomach cancer Neg Hx     Social History:  reports that she has never smoked. She has never used smokeless tobacco. She reports current alcohol use. She reports that she does not use drugs.  Allergies:  Allergies  Allergen Reactions  . Aspirin Other (See Comments)    Bruising.   . Excedrin Migraine [  Aspirin-Acetaminophen-Caffeine] Other (See Comments)    Bruising     Medications Prior to Admission  Medication Sig Dispense Refill Last Dose  . Cholecalciferol (VITAMIN D3) 2000 UNITS TABS Take 2,000 Units by mouth every morning.    06/14/2020 at Unknown time  . famotidine (PEPCID) 20 MG tablet Take 20 mg by mouth at bedtime.   06/14/2020 at Unknown time  . ibuprofen (ADVIL,MOTRIN) 200 MG tablet Take 200 mg by mouth as needed.   Past Week at Unknown time  . ipratropium (ATROVENT) 0.03 % nasal spray SPRAY 2 SPRAYS INTO BOTH NOSTRILS EVERY 12 HOURS (Patient taking differently: Place 2 sprays into both nostrils daily.)  30 mL 11 06/15/2020 at 0730  . minoxidil (ROGAINE) 2 % external solution Apply topically daily as needed.   Past Week at Unknown time  . Multiple Vitamin (MULTIVITAMIN WITH MINERALS) TABS tablet Take 1 tablet by mouth every morning.   06/14/2020 at Unknown time  . sertraline (ZOLOFT) 100 MG tablet TAKE 1 TABLET BY MOUTH EVERY DAY IN THE MORNING 90 tablet 1 06/15/2020 at 0730  . SUMAtriptan (IMITREX) 100 MG tablet TAKE 1 TABLET BY MOUTH EVERY 2HRS AS NEEDED FOR MIGRAINE. MAY REPEAT IN 2HRS IF HEADACHE PERSISTS 9 tablet 6 Past Month at Unknown time    Review of Systems  All other systems reviewed and are negative.   Blood pressure 134/65, pulse 70, temperature 98.3 F (36.8 C), temperature source Oral, resp. rate 16, height 5\' 10"  (1.778 m), weight 87.3 kg, SpO2 100 %. Physical Exam Vitals reviewed.  Constitutional:      Appearance: Normal appearance.  HENT:     Head: Normocephalic.  Cardiovascular:     Rate and Rhythm: Normal rate.  Pulmonary:     Effort: Pulmonary effort is normal.  Abdominal:     General: Abdomen is flat.     Palpations: Abdomen is soft.  Musculoskeletal:        General: Normal range of motion.     Cervical back: Normal range of motion.  Skin:    General: Skin is warm and dry.  Neurological:     General: No focal deficit present.     Mental Status: She is alert and oriented to person, place, and time.  Psychiatric:        Mood and Affect: Mood normal.        Behavior: Behavior normal.    Assessment/Plan:  74Y menopausal female scheduled for operative hysteroscopy with D&C for PMB and thickened endometrium  -Consents signed and placed in patient chart. Counseled on risks of surgery including but not limited to bleeding, infection, and damage to surrounding organs -No antibiotics indicated -SCD VTE ppx -Menopausal, no pregnancy testing indicated -Routine preop/postop care -Anticipate discharge home today. DC instructions reviewed   Jennie Bolar A  Kazue Cerro 06/15/2020, 9:42 AM

## 2020-06-16 ENCOUNTER — Encounter (HOSPITAL_BASED_OUTPATIENT_CLINIC_OR_DEPARTMENT_OTHER): Payer: Self-pay | Admitting: Obstetrics and Gynecology

## 2020-06-16 LAB — SURGICAL PATHOLOGY

## 2020-07-07 DIAGNOSIS — Z886 Allergy status to analgesic agent status: Secondary | ICD-10-CM | POA: Diagnosis not present

## 2020-07-07 DIAGNOSIS — M25562 Pain in left knee: Secondary | ICD-10-CM | POA: Diagnosis not present

## 2020-07-07 DIAGNOSIS — M25561 Pain in right knee: Secondary | ICD-10-CM | POA: Diagnosis not present

## 2020-07-07 DIAGNOSIS — M170 Bilateral primary osteoarthritis of knee: Secondary | ICD-10-CM | POA: Diagnosis not present

## 2020-07-12 DIAGNOSIS — K219 Gastro-esophageal reflux disease without esophagitis: Secondary | ICD-10-CM | POA: Diagnosis not present

## 2020-07-12 DIAGNOSIS — J358 Other chronic diseases of tonsils and adenoids: Secondary | ICD-10-CM | POA: Diagnosis not present

## 2020-08-03 ENCOUNTER — Ambulatory Visit (INDEPENDENT_AMBULATORY_CARE_PROVIDER_SITE_OTHER): Payer: Medicare Other | Admitting: Family Medicine

## 2020-08-03 DIAGNOSIS — M8580 Other specified disorders of bone density and structure, unspecified site: Secondary | ICD-10-CM

## 2020-08-03 DIAGNOSIS — Z0000 Encounter for general adult medical examination without abnormal findings: Secondary | ICD-10-CM | POA: Diagnosis not present

## 2020-08-03 DIAGNOSIS — Z13820 Encounter for screening for osteoporosis: Secondary | ICD-10-CM

## 2020-08-03 DIAGNOSIS — Z780 Asymptomatic menopausal state: Secondary | ICD-10-CM

## 2020-08-03 NOTE — Patient Instructions (Addendum)
 Fall Prevention in the Home, Adult Falls can cause injuries and can happen to people of all ages. There are many things you can do to make your home safe and to help prevent falls. Ask for help when making these changes. What actions can I take to prevent falls? General Instructions  Use good lighting in all rooms. Replace any light bulbs that burn out.  Turn on the lights in dark areas. Use night-lights.  Keep items that you use often in easy-to-reach places. Lower the shelves around your home if needed.  Set up your furniture so you have a clear path. Avoid moving your furniture around.  Do not have throw rugs or other things on the floor that can make you trip.  Avoid walking on wet floors.  If any of your floors are uneven, fix them.  Add color or contrast paint or tape to clearly mark and help you see: ? Grab bars or handrails. ? First and last steps of staircases. ? Where the edge of each step is.  If you use a stepladder: ? Make sure that it is fully opened. Do not climb a closed stepladder. ? Make sure the sides of the stepladder are locked in place. ? Ask someone to hold the stepladder while you use it.  Know where your pets are when moving through your home. What can I do in the bathroom?  Keep the floor dry. Clean up any water on the floor right away.  Remove soap buildup in the tub or shower.  Use nonskid mats or decals on the floor of the tub or shower.  Attach bath mats securely with double-sided, nonslip rug tape.  If you need to sit down in the shower, use a plastic, nonslip stool.  Install grab bars by the toilet and in the tub and shower. Do not use towel bars as grab bars.      What can I do in the bedroom?  Make sure that you have a light by your bed that is easy to reach.  Do not use any sheets or blankets for your bed that hang to the floor.  Have a firm chair with side arms that you can use for support when you get dressed. What can I do  in the kitchen?  Clean up any spills right away.  If you need to reach something above you, use a step stool with a grab bar.  Keep electrical cords out of the way.  Do not use floor polish or wax that makes floors slippery. What can I do with my stairs?  Do not leave any items on the stairs.  Make sure that you have a light switch at the top and the bottom of the stairs.  Make sure that there are handrails on both sides of the stairs. Fix handrails that are broken or loose.  Install nonslip stair treads on all your stairs.  Avoid having throw rugs at the top or bottom of the stairs.  Choose a carpet that does not hide the edge of the steps on the stairs.  Check carpeting to make sure that it is firmly attached to the stairs. Fix carpet that is loose or worn. What can I do on the outside of my home?  Use bright outdoor lighting.  Fix the edges of walkways and driveways and fix any cracks.  Remove anything that might make you trip as you walk through a door, such as a raised step or threshold.  Trim   any bushes or trees on paths to your home.  Check to see if handrails are loose or broken and that both sides of all steps have handrails.  Install guardrails along the edges of any raised decks and porches.  Clear paths of anything that can make you trip, such as tools or rocks.  Have leaves, snow, or ice cleared regularly.  Use sand or salt on paths during winter.  Clean up any spills in your garage right away. This includes grease or oil spills. What other actions can I take?  Wear shoes that: ? Have a low heel. Do not wear high heels. ? Have rubber bottoms. ? Feel good on your feet and fit well. ? Are closed at the toe. Do not wear open-toe sandals.  Use tools that help you move around if needed. These include: ? Canes. ? Walkers. ? Scooters. ? Crutches.  Review your medicines with your doctor. Some medicines can make you feel dizzy. This can increase your  chance of falling. Ask your doctor what else you can do to help prevent falls. Where to find more information  Centers for Disease Control and Prevention, [employer]: www.cdc.gov  National Institute on Aging: www.nia.nih.gov Contact a doctor if:  You are afraid of falling at home.  You feel weak, drowsy, or dizzy at home.  You fall at home. Summary  There are many simple things that you can do to make your home safe and to help prevent falls.  Ways to make your home safe include removing things that can make you trip and installing grab bars in the bathroom.  Ask for help when making these changes in your home. This information is not intended to replace advice given to you by your health care provider. Make sure you discuss any questions you have with your health care provider. Document Revised: 12/23/2019 Document Reviewed: 12/23/2019 Elsevier Patient Education  2021 [employer]   Health Maintenance, Female Adopting a healthy lifestyle and getting preventive care are important in promoting health and wellness. Ask your health care provider about:  The right schedule for you to have regular tests and exams.  Things you can do on your own to prevent diseases and keep yourself healthy. What should I know about diet, weight, and exercise? Eat a healthy diet  Eat a diet that includes plenty of vegetables, fruits, low-fat dairy products, and lean protein.  Do not eat a lot of foods that are high in solid fats, added sugars, or sodium.   Maintain a healthy weight Body mass index (BMI) is used to identify weight problems. It estimates body fat based on height and weight. Your health care provider can help determine your BMI and help you achieve or maintain a healthy weight. Get regular exercise Get regular exercise. This is one of the most important things you can do for your health. Most adults should:  Exercise for at least 150 minutes each week. The exercise should increase  your heart rate and make you sweat (moderate-intensity exercise).  Do strengthening exercises at least twice a week. This is in addition to the moderate-intensity exercise.  Spend less time sitting. Even light physical activity can be beneficial. Watch cholesterol and blood lipids Have your blood tested for lipids and cholesterol at 74 years of age, then have this test every 5 years. Have your cholesterol levels checked more often if:  Your lipid or cholesterol levels are high.  You are older than 74 years of age.  You are at   high risk for heart disease. What should I know about cancer screening? Depending on your health history and family history, you may need to have cancer screening at various ages. This may include screening for:  Breast cancer.  Cervical cancer.  Colorectal cancer.  Skin cancer.  Lung cancer. What should I know about heart disease, diabetes, and high blood pressure? Blood pressure and heart disease  High blood pressure causes heart disease and increases the risk of stroke. This is more likely to develop in people who have high blood pressure readings, are of African descent, or are overweight.  Have your blood pressure checked: ? Every 3-5 years if you are 18-39 years of age. ? Every year if you are 40 years old or older. Diabetes Have regular diabetes screenings. This checks your fasting blood sugar level. Have the screening done:  Once every three years after age 40 if you are at a normal weight and have a low risk for diabetes.  More often and at a younger age if you are overweight or have a high risk for diabetes. What should I know about preventing infection? Hepatitis B If you have a higher risk for hepatitis B, you should be screened for this virus. Talk with your health care provider to find out if you are at risk for hepatitis B infection. Hepatitis C Testing is recommended for:  Everyone born from 1945 through 1965.  Anyone with known  risk factors for hepatitis C. Sexually transmitted infections (STIs)  Get screened for STIs, including gonorrhea and chlamydia, if: ? You are sexually active and are younger than 74 years of age. ? You are older than 74 years of age and your health care provider tells you that you are at risk for this type of infection. ? Your sexual activity has changed since you were last screened, and you are at increased risk for chlamydia or gonorrhea. Ask your health care provider if you are at risk.  Ask your health care provider about whether you are at high risk for HIV. Your health care provider may recommend a prescription medicine to help prevent HIV infection. If you choose to take medicine to prevent HIV, you should first get tested for HIV. You should then be tested every 3 months for as long as you are taking the medicine. Pregnancy  If you are about to stop having your period (premenopausal) and you may become pregnant, seek counseling before you get pregnant.  Take 400 to 800 micrograms (mcg) of folic acid every day if you become pregnant.  Ask for birth control (contraception) if you want to prevent pregnancy. Osteoporosis and menopause Osteoporosis is a disease in which the bones lose minerals and strength with aging. This can result in bone fractures. If you are 65 years old or older, or if you are at risk for osteoporosis and fractures, ask your health care provider if you should:  Be screened for bone loss.  Take a calcium or vitamin D supplement to lower your risk of fractures.  Be given hormone replacement therapy (HRT) to treat symptoms of menopause. Follow these instructions at home: Lifestyle  Do not use any products that contain nicotine or tobacco, such as cigarettes, e-cigarettes, and chewing tobacco. If you need help quitting, ask your health care provider.  Do not use street drugs.  Do not share needles.  Ask your health care provider for help if you need support or  information about quitting drugs. Alcohol use  Do not drink alcohol   if: ? Your health care provider tells you not to drink. ? You are pregnant, may be pregnant, or are planning to become pregnant.  If you drink alcohol: ? Limit how much you use to 0-1 drink a day. ? Limit intake if you are breastfeeding.  Be aware of how much alcohol is in your drink. In the [place] one drink equals one 12 oz bottle of beer (355 mL), one 5 oz glass of wine (148 mL), or one 1 oz glass of hard liquor (44 mL). General instructions  Schedule regular health, dental, and eye exams.  Stay current with your vaccines.  Tell your health care provider if: ? You often feel depressed. ? You have ever been abused or do not feel safe at home. Summary  Adopting a healthy lifestyle and getting preventive care are important in promoting health and wellness.  Follow your health care provider's instructions about healthy diet, exercising, and getting tested or screened for diseases.  Follow your health care provider's instructions on monitoring your cholesterol and blood pressure. This information is not intended to replace advice given to you by your health care provider. Make sure you discuss any questions you have with your health care provider. Document Revised: 05/14/2018 Document Reviewed: 05/14/2018 Elsevier Patient Education  2021 [place] Maintenance Summary and Written Plan of Care  Lorraine Woods ,  Thank you for allowing me to perform your Medicare Annual Wellness Visit and for your ongoing commitment to your health.   Health Maintenance & Immunization History Health Maintenance  Topic Date Due  . COVID-19 Vaccine (4 - Booster for Pfizer series) 10/04/2020  . MAMMOGRAM  09/06/2021  . COLONOSCOPY (Pts 45-72yrs Insurance coverage will need to be confirmed)  06/28/2022  . TETANUS/TDAP  04/02/2030  . INFLUENZA VACCINE  Completed  . DEXA SCAN  Completed  .  Hepatitis C Screening  Completed  . PNA vac Low Risk Adult  Completed  . HPV VACCINES  Aged Out   Immunization History  Administered Date(s) Administered  . Fluad Quad(high Dose 65+) 02/11/2019  . Influenza, High Dose Seasonal PF 03/12/2016, 03/07/2017, 03/06/2020  . Influenza, Seasonal, Injecte, Preservative Fre 03/05/2014  . Influenza,inj,Quad PF,6+ Mos 03/10/2018  . Influenza-Unspecified 03/04/2013, 03/05/2015, 03/14/2016  . PFIZER(Purple Top)SARS-COV-2 Vaccination 07/06/2019, 07/27/2019, 04/06/2020  . Pneumococcal Conjugate-13 12/29/2013  . Pneumococcal Polysaccharide-23 06/04/2005, 10/20/2015  . Tdap 03/15/2009, 04/02/2020  . Zoster 01/03/2012, 01/30/2012    These are the patient goals that we discussed: Goals Addressed              This Visit's Progress   .  Patient Stated (pt-stated)        08/03/2020 AWV Goal: Fall Prevention  . Over the next year, patient will decrease their risk for falls by: o Using assistive devices, such as a cane or walker, as needed o Identifying fall risks within their home and correcting them by: - Removing throw rugs - Adding handrails to stairs or ramps - Removing clutter and keeping a clear pathway throughout the home - Increasing light, especially at night - Adding shower handles/bars - Raising toilet seat o Identifying potential personal risk factors for falls: - Medication side effects - Incontinence/urgency - Vestibular dysfunction - Hearing loss - Musculoskeletal disorders - Neurological disorders - Orthostatic hypotension          This is a list of Health Maintenance Items that are overdue or due now: There are no preventive care reminders to  display for this patient.   Orders/Referrals Placed Today: Orders Placed This Encounter  Procedures  . DEXAScan    Standing Status:   Future    Standing Expiration Date:   08/03/2021    Scheduling Instructions:     Please call the patient to schedule the appointment. She is due  after 05/26/21.    Order Specific Question:   Reason for exam:    Answer:   Screening for osteoporosis    Order Specific Question:   Preferred imaging location?    Answer:   [facility]   (Contact our referral department at [phone] if you have not spoken with someone about your referral appointment within the next 5 days)    Follow-up Plan . Follow-up with Hali Marry, MD as planned . Schedule your appointment at your pharmacy for your shingrix vaccine. . Medicare wellness in one year.

## 2020-08-03 NOTE — Progress Notes (Signed)
MEDICARE ANNUAL WELLNESS VISIT  08/03/2020  Telephone Visit Disclaimer This Medicare AWV was conducted by telephone due to national recommendations for restrictions regarding the COVID-19 Pandemic (e.g. social distancing).  I verified, using two identifiers, that I am speaking with Lorraine Woods or their authorized healthcare agent. I discussed the limitations, risks, security, and privacy concerns of performing an evaluation and management service by telephone and the potential availability of an in-person appointment in the future. The patient expressed understanding and agreed to proceed.  Location of Patient: Home Location of Provider (nurse):  Provider home  Subjective:    LOUNETTE Woods is a 75 y.o. female patient of Metheney, Rene Kocher, MD who had a Medicare Annual Wellness Visit today via telephone. Lorraine Woods is Retired and lives with their spouse. she has 2 children. she reports that she is socially active and does interact with friends/family regularly. she is moderately physically active and enjoys fundraiser's and spending time with her friends.  Patient Care Team: Hali Marry, MD as PCP - General (Family Medicine) Guss Bunde, MD as Consulting Physician (Obstetrics and Gynecology)  Advanced Directives 08/03/2020 06/15/2020 05/11/2019 05/05/2018 06/28/2017 04/23/2017 03/23/2014  Does Patient Have a Medical Advance Directive? Yes Yes Yes Yes No Yes Yes  Type of Paramedic of [place];Living will Living will [place];Living will [place];Living will - [place];Living will [place];Living will  Does patient want to make changes to medical advance directive? No - Patient declined No - Patient declined No - Patient declined Yes (MAU/Ambulatory/Procedural Areas - Information given) - No - Patient declined No - Patient declined  Copy of [place] in Chart? No  - copy requested - No - copy requested No - copy requested - No - copy requested No - copy requested  Pre-existing out of facility DNR order (yellow form or pink MOST form) - - - - - - [facility] Utilization Over the Past 12 Months: # of hospitalizations or ER visits: 0 # of surgeries: 1  Review of Systems    Patient reports that her overall health is worse compared to last year.  History obtained from chart review and the patient  Patient Reported Readings (BP, Pulse, CBG, Weight, etc) none  Pain Assessment Pain : 0-10 Pain Score: 4  Pain Type: Chronic pain Pain Location: Knee Pain Orientation: Right Pain Descriptors / Indicators: Aching Pain Onset: More than a month ago Pain Frequency: Intermittent Pain Relieving Factors: medication Effect of Pain on Daily Activities: Tinnie Gens, RN  Pain Relieving Factors: medication  Current Medications & Allergies (verified) Allergies as of 08/03/2020      Reactions   Aspirin Other (See Comments)   Bruising.    Excedrin Migraine [aspirin-acetaminophen-caffeine] Other (See Comments)   Bruising      Medication List       Accurate as of August 03, 2020 11:27 AM. If you have any questions, ask your nurse or doctor.        Aleve 220 MG tablet Generic drug: naproxen sodium Take 220 mg by mouth. 2 times a day for two weeks. Take a break for a week.and continue the 2 times a day for two weeks again.   famotidine 20 MG tablet Commonly known as: PEPCID Take 20 mg by mouth at bedtime.   ibuprofen 200 MG tablet Commonly known as: ADVIL Take 200 mg by mouth as needed.   ipratropium  0.03 % nasal spray Commonly known as: ATROVENT SPRAY 2 SPRAYS INTO BOTH NOSTRILS EVERY 12 HOURS What changed: See the new instructions.   minoxidil 2 % external solution Commonly known as: ROGAINE Apply topically daily as needed.   multivitamin with minerals Tabs tablet Take 1 tablet by mouth every morning.   omeprazole 20 MG capsule Commonly  known as: PRILOSEC Take 20 mg by mouth 2 (two) times daily.   sertraline 100 MG tablet Commonly known as: ZOLOFT TAKE 1 TABLET BY MOUTH EVERY DAY IN THE MORNING   SUMAtriptan 100 MG tablet Commonly known as: IMITREX TAKE 1 TABLET BY MOUTH EVERY 2HRS AS NEEDED FOR MIGRAINE. MAY REPEAT IN 2HRS IF HEADACHE PERSISTS   Vitamin D3 50 MCG (2000 UT) Tabs Take 2,000 Units by mouth every morning.       History (reviewed): Past Medical History:  Diagnosis Date  . Arthritis   . Depression   . GAD (generalized anxiety disorder)   . History of basal cell carcinoma (BCC) excision    nose  x2  last one 2019  . Hyperlipidemia   . Knee pain, chronic   . Migraine   . Osteopenia   . PMB (postmenopausal bleeding)   . Seasonal allergies   . Thickened endometrium   . Wears glasses    Past Surgical History:  Procedure Laterality Date  . APPENDECTOMY  37   age 89  . BREAST LUMPECTOMY Right 2002   on HRT, fibromadenoma  . CHOLECYSTECTOMY N/A 09/17/2013   Procedure: LAPAROSCOPIC CHOLECYSTECTOMY;  Surgeon: Stark Klein, MD;  Location: [facility];  Service: General;  Laterality: N/A;  . COLONOSCOPY WITH PROPOFOL  last one 06-28-2017  dr Havery Moros  . DILATATION & CURETTAGE/HYSTEROSCOPY WITH MYOSURE N/A 06/15/2020   Procedure: DILATATION & CURETTAGE/HYSTEROSCOPY WITH MYOSURE;  Surgeon: Armandina Stammer, DO;  Location: [place];  Service: Gynecology;  Laterality: N/A;  . LAPAROSCOPIC LIVER CYST UNROOFING N/A 09/17/2013   Procedure: LAPAROSCOPIC LIVER CYST UNROOFING;  Surgeon: Stark Klein, MD;  Location: [facility];  Service: General;  Laterality: N/A;  . MOHS SURGERY  x2  last one approx. 2019   bcc of nose  . ORIF ANKLE FRACTURE Left 2000  . ORIF WRIST FRACTURE Right 10-30-2016  @[facility]   . sclerotherapy on left leg  2013  . TUBAL LIGATION  1982   Family History  Problem Relation Age of Onset  . Brain cancer Mother 14  . Cancer Mother   . Diabetes Father   . Hypertension Father    . CAD Father   . Heart disease Father   . Colon polyps Son        Lorraine Woods  . Colon cancer Neg Hx   . Esophageal cancer Neg Hx   . Rectal cancer Neg Hx   . Stomach cancer Neg Hx    Social History   Socioeconomic History  . Marital status: Married    Spouse name: Jenny Reichmann   . Number of children: 2  . Years of education: 46  . Highest education level: Associate degree: academic program  Occupational History  . 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 Smoking status: Never Smoker  . Smokeless tobacco: Never Used  Vaping Use  . Vaping Use: Never used  Substance and Sexual Activity  . Alcohol use: Yes    Alcohol/week: 0.0 standard drinks    Comment: 2 wine per month  . Drug  use: Never  . Sexual activity: Not Currently    Partners: Male    Birth control/protection: Post-menopausal  Other Topics Concern  . Not on file  Social History Narrative    Daily caffeine. 1 cup coffe in aM. Walks in her neighborhood for exercise. Has officially retired from working. Goes antique shopping with her husband.   Social Determinants of Health   Financial Resource Strain: Low Risk   . Difficulty of Paying Living Expenses: Not hard at all  Food Insecurity: No Food Insecurity  . Worried About Charity fundraiser in the Last Year: Never true  . Ran Out of Food in the Last Year: Never true  Transportation Needs: No Transportation Needs  . Lack of Transportation (Medical): No  . Lack of Transportation (Non-Medical): No  Physical Activity: Insufficiently Active  . Days of Exercise per Week: 5 days  . Minutes of Exercise per Session: 20 min  Stress: No Stress Concern Present  . Feeling of Stress : Not at all  Social Connections: Moderately Integrated  . Frequency of Communication with Friends and Family: Once a week  . Frequency of Social Gatherings with Friends and Family: Once a week  . Attends Religious Services:  More than 4 times per year  . Active Member of Clubs or Organizations: Yes  . Attends Archivist Meetings: Not on file  . Marital Status: Married    Activities of Daily Living In your present state of health, do you have any difficulty performing the following activities: 08/03/2020 06/15/2020  Hearing? Y N  Comment has had a hearing exam -  Vision? N N  Difficulty concentrating or making decisions? Y N  Comment trouble remembering names. -  Walking or climbing stairs? Y Y  Comment right knee pain makes it harder. -  Dressing or bathing? N N  Doing errands, shopping? N -  Preparing Food and eating ? N -  Using the Toilet? N -  In the past six months, have you accidently leaked urine? N -  Comment urgency -  Do you have problems with loss of bowel control? N -  Managing your Medications? N -  Managing your Finances? N -  Housekeeping or managing your Housekeeping? N -  Some recent data might be hidden    Patient Education/ Literacy How often do you need to have someone help you when you read instructions, pamphlets, or other written materials from your doctor or pharmacy?: 1 - Never What is the last grade level you completed in school?: 12th grade  Exercise Current Exercise Habits: Home exercise routine, Type of exercise: walking, Time (Minutes): 20, Frequency (Times/Week): 5, Weekly Exercise (Minutes/Week): 100, Intensity: Moderate, Exercise limited by: orthopedic condition(s) (right knee pain)  Diet Patient reports consuming 3 meals a day and 2 snack(s) a day Patient reports that her primary diet is: Regular Patient reports that she does have regular access to food.   Depression Screen PHQ 2/9 Scores 08/03/2020 10/26/2019 05/11/2019 04/28/2019 11/04/2018 05/05/2018 05/05/2018  PHQ - 2 Score 0 1 0 2 2 0 0  PHQ- 9 Score - - 0 7 4 - -  Exception Documentation - - Medical reason - - - -     Fall Risk Fall Risk  08/03/2020 05/11/2019 04/29/2019 05/05/2018 05/05/2018  Falls in  the past year? 1 0 0 1 1  Comment - - Emmi Telephone Survey: data to providers prior to load - -  Number falls in past yr: 0 0 - 0  0  Injury with Fall? 1 0 - 0 0  Risk for fall due to : No Fall Risks - - - Other (Comment)  Risk for fall due to: Comment - - - - she was walking in her yard,uneven surface lost her balance  Follow up Falls evaluation completed;Education provided;Falls prevention discussed Falls prevention discussed - Falls prevention discussed Falls prevention discussed     Objective:  PRISILA DLOUHY seemed alert and oriented and she participated appropriately during our telephone visit.  Blood Pressure Weight BMI  BP Readings from Last 3 Encounters:  06/15/20 135/71  05/16/20 131/74  04/27/20 (!) 117/46   Wt Readings from Last 3 Encounters:  06/15/20 192 lb 6.4 oz (87.3 kg)  05/16/20 191 lb (86.6 kg)  04/27/20 190 lb (86.2 kg)   BMI Readings from Last 1 Encounters:  06/15/20 27.61 kg/m    *Unable to obtain current vital signs, weight, and BMI due to telephone visit type  Hearing/Vision  . Stepahnie did not seem to have difficulty with hearing/understanding during the telephone conversation . Reports that she has had a formal eye exam by an eye care professional within the past year . Reports that she has had a formal hearing evaluation within the past year *Unable to fully assess hearing and vision during telephone visit type  Cognitive Function: 6CIT Screen 08/03/2020 05/11/2019 05/05/2018 04/23/2017 04/18/2016  What Year? 0 points 0 points 0 points 0 points 0 points  What month? 0 points 0 points 0 points 0 points 0 points  What time? 0 points 0 points 0 points 0 points 0 points  Count back from 20 0 points 0 points 0 points 0 points 0 points  Months in reverse 0 points 0 points 0 points 0 points 0 points  Repeat phrase 2 points 0 points 2 points 4 points 8 points  Total Score 2 0 2 4 8    (Normal:0-7, Significant for Dysfunction: >8)  Normal Cognitive Function  Screening: Yes   Immunization & Health Maintenance Record Immunization History  Administered Date(s) Administered  . Fluad Quad(high Dose 65+) 02/11/2019  . Influenza, High Dose Seasonal PF 03/12/2016, 03/07/2017, 03/06/2020  . Influenza, Seasonal, Injecte, Preservative Fre 03/05/2014  . Influenza,inj,Quad PF,6+ Mos 03/10/2018  . Influenza-Unspecified 03/04/2013, 03/05/2015, 03/14/2016  . PFIZER(Purple Top)SARS-COV-2 Vaccination 07/06/2019, 07/27/2019, 04/06/2020  . Pneumococcal Conjugate-13 12/29/2013  . Pneumococcal Polysaccharide-23 06/04/2005, 10/20/2015  . Tdap 03/15/2009, 04/02/2020  . Zoster 01/03/2012, 01/30/2012    Health Maintenance  Topic Date Due  . COVID-19 Vaccine (4 - Booster for Pfizer series) 10/04/2020  . MAMMOGRAM  09/06/2021  . COLONOSCOPY (Pts 45-55yrs Insurance coverage will need to be confirmed)  06/28/2022  . TETANUS/TDAP  04/02/2030  . INFLUENZA VACCINE  Completed  . DEXA SCAN  Completed  . Hepatitis C Screening  Completed  . PNA vac Low Risk Adult  Completed  . HPV VACCINES  Aged Out       Assessment  This is a routine wellness examination for CHALISE PE.  Health Maintenance: Due or Overdue There are no preventive care reminders to display for this patient.  Lorraine Woods does not need a referral for Community Assistance: Care Management:   no Social Work:    no Prescription Assistance:  no Nutrition/Diabetes Education:  no   Plan:  Personalized Goals Goals Addressed              This Visit's Progress   .  Patient Stated (pt-stated)  08/03/2020 AWV Goal: Fall Prevention  . Over the next year, patient will decrease their risk for falls by: o Using assistive devices, such as a cane or walker, as needed o Identifying fall risks within their home and correcting them by: - Removing throw rugs - Adding handrails to stairs or ramps - Removing clutter and keeping a clear pathway throughout the home - Increasing light, especially  at night - Adding shower handles/bars - Raising toilet seat o Identifying potential personal risk factors for falls: - Medication side effects - Incontinence/urgency - Vestibular dysfunction - Hearing loss - Musculoskeletal disorders - Neurological disorders - Orthostatic hypotension        Personalized Health Maintenance & Screening Recommendations  Shingrix, Dexa scan- december  Lung Cancer Screening Recommended: no (Low Dose CT Chest recommended if Age 42-80 years, 30 pack-year currently smoking OR have quit w/in past 15 years) Hepatitis C Screening recommended: no HIV Screening recommended: no  Advanced Directives: Written information was not prepared per patient's request.  Referrals & Orders Orders Placed This Encounter  Procedures  . [place]    Follow-up Plan . Follow-up with Hali Marry, MD as planned . Schedule your appointment at your pharmacy for your shingrix vaccine. . Medicare wellness in one year.   I have personally reviewed and noted the following in the patient's chart:   . Medical and social history . Use of alcohol, tobacco or illicit drugs  . Current medications and supplements . Functional ability and status . Nutritional status . Physical activity . Advanced directives . List of other physicians . Hospitalizations, surgeries, and ER visits in previous 12 months . Vitals . Screenings to include cognitive, depression, and falls . Referrals and appointments  In addition, I have reviewed and discussed with Lorraine Woods certain preventive protocols, quality metrics, and best practice recommendations. A written personalized care plan for preventive services as well as general preventive health recommendations is available and can be mailed to the patient at her request.      Tinnie Gens, RN  08/03/2020

## 2020-08-16 ENCOUNTER — Other Ambulatory Visit: Payer: Self-pay | Admitting: Family Medicine

## 2020-08-16 DIAGNOSIS — F411 Generalized anxiety disorder: Secondary | ICD-10-CM

## 2020-08-31 DIAGNOSIS — T7840XA Allergy, unspecified, initial encounter: Secondary | ICD-10-CM | POA: Diagnosis not present

## 2020-08-31 DIAGNOSIS — J358 Other chronic diseases of tonsils and adenoids: Secondary | ICD-10-CM | POA: Diagnosis not present

## 2020-09-07 DIAGNOSIS — Z1231 Encounter for screening mammogram for malignant neoplasm of breast: Secondary | ICD-10-CM | POA: Diagnosis not present

## 2020-09-07 LAB — HM MAMMOGRAPHY

## 2020-09-21 ENCOUNTER — Encounter: Payer: Self-pay | Admitting: Family Medicine

## 2020-10-11 DIAGNOSIS — Z23 Encounter for immunization: Secondary | ICD-10-CM | POA: Diagnosis not present

## 2020-10-20 ENCOUNTER — Telehealth: Payer: Self-pay | Admitting: Family Medicine

## 2020-10-20 NOTE — Chronic Care Management (AMB) (Signed)
  Chronic Care Management   Note  10/20/2020 Name: Lorraine Woods MRN: [id number] DOB: [birth year]  Lorraine Woods is a 75 y.o. year old female who is a primary care patient of Madilyn Fireman, Rene Kocher, MD. I reached out to Dianne Dun by phone today in response to a referral sent by Ms. Lezlie Octave Critz's PCP, Hali Marry, MD.   Ms. Czerwonka was given information about Chronic Care Management services today including:  1. CCM service includes personalized support from designated clinical staff supervised by her physician, including individualized plan of care and coordination with other care providers 2. 24/7 contact phone numbers for assistance for urgent and routine care needs. 3. Service will only be billed when office clinical staff spend 20 minutes or more in a month to coordinate care. 4. Only one practitioner may furnish and bill the service in a calendar month. 5. The patient may stop CCM services at any time (effective at the end of the month) by phone call to the office staff.   Patient agreed to services and verbal consent obtained.   Follow up plan:   Lauretta Grill [employer] Scheduler

## 2020-10-25 ENCOUNTER — Ambulatory Visit: Payer: Medicare Other | Admitting: Family Medicine

## 2020-11-02 ENCOUNTER — Ambulatory Visit (INDEPENDENT_AMBULATORY_CARE_PROVIDER_SITE_OTHER): Payer: Medicare Other | Admitting: Family Medicine

## 2020-11-02 ENCOUNTER — Other Ambulatory Visit: Payer: Self-pay

## 2020-11-02 ENCOUNTER — Encounter: Payer: Self-pay | Admitting: Family Medicine

## 2020-11-02 VITALS — BP 119/58 | HR 77 | Ht 70.0 in | Wt 191.0 lb

## 2020-11-02 DIAGNOSIS — R7309 Other abnormal glucose: Secondary | ICD-10-CM

## 2020-11-02 DIAGNOSIS — F411 Generalized anxiety disorder: Secondary | ICD-10-CM | POA: Diagnosis not present

## 2020-11-02 DIAGNOSIS — G8929 Other chronic pain: Secondary | ICD-10-CM | POA: Diagnosis not present

## 2020-11-02 DIAGNOSIS — G43009 Migraine without aura, not intractable, without status migrainosus: Secondary | ICD-10-CM

## 2020-11-02 DIAGNOSIS — R61 Generalized hyperhidrosis: Secondary | ICD-10-CM | POA: Diagnosis not present

## 2020-11-02 DIAGNOSIS — M25561 Pain in right knee: Secondary | ICD-10-CM

## 2020-11-02 NOTE — Assessment & Plan Note (Signed)
Doing really well.  No recent exacerbations or flares.

## 2020-11-02 NOTE — Progress Notes (Signed)
Established Patient Office Visit  Subjective:  Patient ID: Lorraine Woods, female    DOB: [birth year]  Age: 75 y.o. MRN: [id number]  CC:  Chief Complaint  Patient presents with  . Follow-up    Migraine and mood    HPI Lorraine Woods presents for   Doing well on current regimen. No refills needed at this time.   Pt wanted to make Dr. Madilyn Fireman aware that since she was last seen she has had episodes of sweating during her sleep x 3 months.   She did see Dr. Gala Romney but she referred her to another provider and she wasn't happy with her reviews that she read online and went with someone else. She had a D&C in January this went well with the other provider.   She had a fall with the snow/ice and had injections done to both knees. But she now feels that her L knee feels that it wants to give away on her at times.   She did see the orthopedist and they did injections which were helpful for some time but they feel like they have worn off.  He also reports that she has been having some night sweats for about 3 months.  Past Medical History:  Diagnosis Date  . Arthritis   . Depression   . GAD (generalized anxiety disorder)   . History of basal cell carcinoma (BCC) excision    nose  x2  last one 2019  . Hyperlipidemia   . Knee pain, chronic   . Migraine   . Osteopenia   . PMB (postmenopausal bleeding)   . Seasonal allergies   . Thickened endometrium   . Wears glasses     Past Surgical History:  Procedure Laterality Date  . APPENDECTOMY  40   age 70  . BREAST LUMPECTOMY Right 2002   on HRT, fibromadenoma  . CHOLECYSTECTOMY N/A 09/17/2013   Procedure: LAPAROSCOPIC CHOLECYSTECTOMY;  Surgeon: Stark Klein, MD;  Location: [facility];  Service: General;  Laterality: N/A;  . COLONOSCOPY WITH PROPOFOL  last one 06-28-2017  dr Havery Moros  . DILATATION & CURETTAGE/HYSTEROSCOPY WITH MYOSURE N/A 06/15/2020   Procedure: DILATATION & CURETTAGE/HYSTEROSCOPY WITH MYOSURE;  Surgeon: Armandina Stammer, DO;  Location: [facility];  Service: Gynecology;  Laterality: N/A;  . LAPAROSCOPIC LIVER CYST UNROOFING N/A 09/17/2013   Procedure: LAPAROSCOPIC LIVER CYST UNROOFING;  Surgeon: Stark Klein, MD;  Location: [facility];  Service: General;  Laterality: N/A;  . MOHS SURGERY  x2  last one approx. 2019   bcc of nose  . ORIF ANKLE FRACTURE Left 2000  . ORIF WRIST FRACTURE Right 10-30-2016  @[facility]   . sclerotherapy on left leg  2013  . TUBAL LIGATION  1982    Family History  Problem Relation Age of Onset  . Brain cancer Mother 95  . Cancer Mother   . Diabetes Father   . Hypertension Father   . CAD Father   . Heart disease Father   . Colon polyps Son        Puetz-jehgers syndrome  . Colon cancer Neg Hx   . Esophageal cancer Neg Hx   . Rectal cancer Neg Hx   . Stomach cancer Neg Hx     Social History   Socioeconomic History  . Marital status: Married    Spouse name: Jenny Reichmann   . Number of children: 2  . Years of education: 70  . Highest education level: Associate degree: academic program  Occupational History  .  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 Smoking status: Never Smoker  . Smokeless tobacco: Never Used  Vaping Use  . Vaping Use: Never used  Substance and Sexual Activity  . Alcohol use: Yes    Alcohol/week: 0.0 standard drinks    Comment: 2 wine per month  . Drug use: Never  . Sexual activity: Not Currently    Partners: Male    Birth control/protection: Post-menopausal  Other Topics Concern  . Not on file  Social History Narrative    Daily caffeine. 1 cup coffe in aM. Walks in her neighborhood for exercise. Has officially retired from working. Goes antique shopping with her husband.   Social Determinants of Health   Financial Resource Strain: Low Risk   . Difficulty of Paying Living Expenses: Not hard at all  Food Insecurity: No Food Insecurity  . Worried About Charity fundraiser in  the Last Year: Never true  . Ran Out of Food in the Last Year: Never true  Transportation Needs: No Transportation Needs  . Lack of Transportation (Medical): No  . Lack of Transportation (Non-Medical): No  Physical Activity: Insufficiently Active  . Days of Exercise per Week: 5 days  . Minutes of Exercise per Session: 20 min  Stress: No Stress Concern Present  . Feeling of Stress : Not at all  Social Connections: Moderately Integrated  . Frequency of Communication with Friends and Family: Once a week  . Frequency of Social Gatherings with Friends and Family: Once a week  . Attends Religious Services: More than 4 times per year  . Active Member of Clubs or Organizations: Yes  . Attends Archivist Meetings: Not on file  . Marital Status: Married  Human resources officer Violence: Not At Risk  . Fear of Current or Ex-Partner: No  . Emotionally Abused: No  . Physically Abused: No  . Sexually Abused: No    Outpatient Medications Prior to Visit  Medication Sig Dispense Refill  . Calcium Carb-Cholecalciferol (CALTRATE 600+D3 PO) Take 1 tablet by mouth daily.    . Cholecalciferol (VITAMIN D3) 2000 UNITS TABS Take 2,000 Units by mouth every morning.     Marland Kitchen ipratropium (ATROVENT) 0.03 % nasal spray SPRAY 2 SPRAYS INTO BOTH NOSTRILS EVERY 12 HOURS (Patient taking differently: Place 2 sprays into both nostrils daily.) 30 mL 11  . minoxidil (ROGAINE) 2 % external solution Apply topically daily as needed.    . Multiple Vitamin (MULTIVITAMIN WITH MINERALS) TABS tablet Take 1 tablet by mouth every morning.    . naproxen sodium (ALEVE) 220 MG tablet Take 220 mg by mouth. 2 times a day for two weeks. Take a break for a week.and continue the 2 times a day for two weeks again.    Marland Kitchen omeprazole (PRILOSEC) 20 MG capsule Take 20 mg by mouth 2 (two) times daily.    . sertraline (ZOLOFT) 100 MG tablet TAKE 1 TABLET BY MOUTH EVERY DAY IN THE MORNING 90 tablet 1  . SUMAtriptan (IMITREX) 100 MG tablet TAKE 1  TABLET BY MOUTH EVERY 2HRS AS NEEDED FOR MIGRAINE. MAY REPEAT IN 2HRS IF HEADACHE PERSISTS 9 tablet 6  . famotidine (PEPCID) 20 MG tablet Take 20 mg by mouth at bedtime. (Patient not taking: No sig reported)    . ibuprofen (ADVIL,MOTRIN) 200 MG tablet Take 200 mg by mouth as needed. (Patient not taking: Reported on 08/03/2020)     No facility-administered  medications prior to visit.    Allergies  Allergen Reactions  . Aspirin Other (See Comments)    Bruising.   . Excedrin Migraine [Aspirin-Acetaminophen-Caffeine] Other (See Comments)    Bruising     ROS Review of Systems    Objective:    Physical Exam Constitutional:      Appearance: She is well-developed.  HENT:     Head: Normocephalic and atraumatic.  Cardiovascular:     Rate and Rhythm: Normal rate and regular rhythm.     Heart sounds: Normal heart sounds.  Pulmonary:     Effort: Pulmonary effort is normal.     Breath sounds: Normal breath sounds.  Skin:    General: Skin is warm and dry.  Neurological:     Mental Status: She is alert and oriented to person, place, and time.  Psychiatric:        Behavior: Behavior normal.     BP (!) 119/58   Pulse 77   Ht 5\' 10"  (1.778 m)   Wt 191 lb (86.6 kg)   SpO2 92%   BMI 27.41 kg/m  Wt Readings from Last 3 Encounters:  11/02/20 191 lb (86.6 kg)  06/15/20 192 lb 6.4 oz (87.3 kg)  05/16/20 191 lb (86.6 kg)     Health Maintenance Due  Topic Date Due  . Zoster Vaccines- Shingrix (1 of 2) Never done    There are no preventive care reminders to display for this patient.  Lab Results  Component Value Date   TSH 2.70 04/29/2017   Lab Results  Component Value Date   WBC 5.1 06/15/2020   HGB 14.9 06/15/2020   HCT 44.0 06/15/2020   MCV 87.8 06/15/2020   PLT 240 06/15/2020   Lab Results  Component Value Date   NA 144 04/27/2020   K 4.5 04/27/2020   CO2 29 04/27/2020   GLUCOSE 108 (H) 04/27/2020   BUN 20 04/27/2020   CREATININE 0.80 04/27/2020   BILITOT 0.7  04/27/2020   ALKPHOS 48 04/18/2016   AST 18 04/27/2020   ALT 11 04/27/2020   PROT 7.5 04/27/2020   ALBUMIN 4.3 04/18/2016   CALCIUM 9.7 04/27/2020   Lab Results  Component Value Date   CHOL 216 (H) 04/27/2020   Lab Results  Component Value Date   HDL 58 04/27/2020   Lab Results  Component Value Date   LDLCALC 134 (H) 04/27/2020   Lab Results  Component Value Date   TRIG 126 04/27/2020   Lab Results  Component Value Date   CHOLHDL 3.7 04/27/2020   Lab Results  Component Value Date   HGBA1C 5.3 04/18/2016      Assessment & Plan:   Problem List Items Addressed This Visit      Cardiovascular and Mediastinum   Migraine without aura, not refractory - Primary    Doing really well.  No recent exacerbations or flares.        Other   Generalized anxiety disorder    Stable on her current dose of sertraline that she has had some recent stressors at home.  Her son Peutz-Jeghers and has been very ill lately and that has been stressful.       Other Visit Diagnoses    Chronic pain of right knee       Abnormal glucose       Relevant Orders   HgB A1c   TSH   CBC with Differential   Unexplained night sweats       Relevant  Orders   HgB A1c   TSH   CBC with Differential      Abnormal glucose-he had an elevated glucose level back in November when she had a full panel of labs done.  Recommend doing an A1c today to evaluate for prediabetes.  Those results once available.  Night sweats-we will check a CBC to evaluate for anemia as well as a TSH.  No orders of the defined types were placed in this encounter.   Follow-up: Return in about 6 months (around 05/04/2021) for Mood .    [name], MD

## 2020-11-02 NOTE — Progress Notes (Signed)
Doing well on current regimen. No refills needed at this time.   Pt wanted to make Dr. Madilyn Fireman aware that since she was last seen she has had episodes of sweating during her sleep x 3 months.   She did see Dr. Gala Romney but she referred her to another provider and she wasn't happy with her reviews that she read online and went with someone else. She had a D&C in January this went well with the other provider.   She had a fall with the snow/ice and had injections done to both knees. But she now feels that her L knee feels that it wants to give away on her at times.

## 2020-11-02 NOTE — Assessment & Plan Note (Signed)
Stable on her current dose of sertraline that she has had some recent stressors at home.  Her son Peutz-Jeghers and has been very ill lately and that has been stressful.

## 2020-11-03 LAB — CBC WITH DIFFERENTIAL/PLATELET
Absolute Monocytes: 329 cells/uL (ref 200–950)
Basophils Absolute: 38 cells/uL (ref 0–200)
Basophils Relative: 0.8 %
Eosinophils Absolute: 169 cells/uL (ref 15–500)
Eosinophils Relative: 3.6 %
HCT: 45.3 % — ABNORMAL HIGH (ref 35.0–45.0)
Hemoglobin: 14.6 g/dL (ref 11.7–15.5)
Lymphs Abs: 1354 cells/uL (ref 850–3900)
MCH: 28.3 pg (ref 27.0–33.0)
MCHC: 32.2 g/dL (ref 32.0–36.0)
MCV: 87.8 fL (ref 80.0–100.0)
MPV: 10.6 fL (ref 7.5–12.5)
Monocytes Relative: 7 %
Neutro Abs: 2811 cells/uL (ref 1500–7800)
Neutrophils Relative %: 59.8 %
Platelets: 235 10*3/uL (ref 140–400)
RBC: 5.16 10*6/uL — ABNORMAL HIGH (ref 3.80–5.10)
RDW: 12 % (ref 11.0–15.0)
Total Lymphocyte: 28.8 %
WBC: 4.7 10*3/uL (ref 3.8–10.8)

## 2020-11-03 LAB — HEMOGLOBIN A1C
Hgb A1c MFr Bld: 5.4 % of total Hgb (ref <5.7)
Mean Plasma Glucose: 108 mg/dL
eAG (mmol/L): 6 mmol/L

## 2020-11-03 LAB — TSH: TSH: 1.19 mIU/L (ref 0.40–4.50)

## 2020-11-16 ENCOUNTER — Other Ambulatory Visit: Payer: Self-pay

## 2020-11-16 ENCOUNTER — Ambulatory Visit (INDEPENDENT_AMBULATORY_CARE_PROVIDER_SITE_OTHER): Payer: Medicare Other | Admitting: Pharmacist

## 2020-11-16 DIAGNOSIS — E785 Hyperlipidemia, unspecified: Secondary | ICD-10-CM | POA: Diagnosis not present

## 2020-11-16 DIAGNOSIS — F411 Generalized anxiety disorder: Secondary | ICD-10-CM

## 2020-11-16 NOTE — Patient Instructions (Signed)
Lorraine Woods, it was a pleasure meeting you today! Scroll way down to see helpful info about cholesterol content in foods.  Call the office or send a mychart if you'd like to speak to the pharmacist sooner, otherwise we will touchbase via phone call in 1 year!  Thanks, Lysle Morales  Visit Information   PATIENT GOALS:   Goals Addressed             This Visit's Progress    Medication Management       Patient Goals/Self-Care Activities Over the next 365 days, patient will:  take medications as prescribed and engage in dietary modifications by aiming for minimal saturated/trans fat, and choosing vegetables + lean meat as often as possible for cholesterol management  Follow Up Plan: Telephone follow up appointment with care management team member scheduled for:  1 year         Consent to CCM Services: [name] was given information about Chronic Care Management services today including:  CCM service includes personalized support from designated clinical staff supervised by her physician, including individualized plan of care and coordination with other care providers 24/7 contact phone numbers for assistance for urgent and routine care needs. Service will only be billed when office clinical staff spend 20 minutes or more in a month to coordinate care. Only one practitioner may furnish and bill the service in a calendar month. The patient may stop CCM services at any time (effective at the end of the month) by phone call to the office staff. The patient will be responsible for cost sharing (co-pay) of up to 20% of the service fee (after annual deductible is met).  Patient agreed to services and verbal consent obtained.   Patient verbalizes understanding of instructions provided today and agrees to view in [place].   Telephone follow up appointment with care management team member scheduled for: 1 year  CLINICAL CARE PLAN: Patient Care Plan: Medication Management     Problem Identified:  HLD, anxiety      Long-Range Goal: Disease Progression Prevention   Note:   Current Barriers:  None at present  Pharmacist Clinical Goal(s):  Over the next 365 days, patient will maintain control of chronic conditions as evidenced by medication fill history, lab values, and vitals  through collaboration with PharmD and provider.   Interventions: 1:1 collaboration with Hali Marry, MD regarding development and update of comprehensive plan of care as evidenced by provider attestation and co-signature Inter-disciplinary care team collaboration (see longitudinal plan of care) Comprehensive medication review performed; medication list updated in electronic medical record  Hyperlipidemia:  Uncontrolled; current treatment:lifestyle modifications only; LDL 134 ascvd 12.8%  Educated on LDL, ASCVD number, and guidance that age [id number] with no hx diabetes recommends lifestyle modifications + moderate intensity statin, patient opted for conservative management w/lifestyle modification only Recommended continue current regimen  Anxiety:  Controlled; current treatment:Sertraline 1061m   Patient reports a few recent stressors, but overall pleased with her current medication effectiveness  Connected with no one for mental health support, pt respectfully declined, but I advised we can connect her if she requests in the future  Recommended continue current regimen  Patient Goals/Self-Care Activities Over the next 365 days, patient will:  take medications as prescribed and engage in dietary modifications by aiming for minimal saturated/trans fat, and choosing vegetables + lean meat as often as possible for cholesterol management  Follow Up Plan: Telephone follow up appointment with care management team member scheduled for:  1 year  Cholesterol Content in Foods Cholesterol is a waxy, fat-like substance that helps to carry fat in the blood. The body needs cholesterol in small  amounts, but too much cholesterol can causedamage to the arteries and heart. Most people should eat less than 200 milligrams (mg) of cholesterol a day. Foods with cholesterol  Cholesterol is found in animal-based foods, such as meat, seafood, and dairy. Generally, low-fat dairy and lean meats have less cholesterol than full-fat dairy and fatty meats. The milligrams of cholesterol per serving (mg per serving) of common cholesterol-containing foods are listed below. Meat and other proteins Egg -- one large whole egg has 186 mg. Veal shank -- 4 oz has 141 mg. Lean ground [place] (93% lean) -- 4 oz has 118 mg. Fat-trimmed lamb loin -- 4 oz has 106 mg. Lean ground beef (90% lean) -- 4 oz has 100 mg. Lobster -- 3.5 oz has 90 mg. Pork loin chops -- 4 oz has 86 mg. Canned salmon -- 3.5 oz has 83 mg. Fat-trimmed beef top loin -- 4 oz has 78 mg. Frankfurter -- 1 frank (3.5 oz) has 77 mg. Crab -- 3.5 oz has 71 mg. Roasted chicken without skin, white meat -- 4 oz has 66 mg. Light bologna -- 2 oz has 45 mg. Deli-cut [place] -- 2 oz has 31 mg. Canned tuna -- 3.5 oz has 31 mg. Berniece Salines -- 1 oz has 29 mg. Oysters and mussels (raw) -- 3.5 oz has 25 mg. Mackerel -- 1 oz has 22 mg. Trout -- 1 oz has 20 mg. Pork sausage -- 1 link (1 oz) has 17 mg. Salmon -- 1 oz has 16 mg. Tilapia -- 1 oz has 14 mg. Dairy Soft-serve ice cream --  cup (4 oz) has 103 mg. Whole-milk yogurt -- 1 cup (8 oz) has 29 mg. Cheddar cheese -- 1 oz has 28 mg. American cheese -- 1 oz has 28 mg. Whole milk -- 1 cup (8 oz) has 23 mg. 2% milk -- 1 cup (8 oz) has 18 mg. Cream cheese -- 1 tablespoon (Tbsp) has 15 mg. Cottage cheese --  cup (4 oz) has 14 mg. Low-fat (1%) milk -- 1 cup (8 oz) has 10 mg. Sour cream -- 1 Tbsp has 8.5 mg. Low-fat yogurt -- 1 cup (8 oz) has 8 mg. Nonfat Greek yogurt -- 1 cup (8 oz) has 7 mg. Half-and-half cream -- 1 Tbsp has 5 mg. Fats and oils Cod liver oil -- 1 tablespoon (Tbsp) has 82 mg. Butter -- 1  Tbsp has 15 mg. Lard -- 1 Tbsp has 14 mg. Bacon grease -- 1 Tbsp has 14 mg. Mayonnaise -- 1 Tbsp has 5-10 mg. Margarine -- 1 Tbsp has 3-10 mg. Exact amounts of cholesterol in these foods may vary depending on specificingredients and brands. Foods without cholesterol Most plant-based foods do not have cholesterol unless you combine them with a food that has cholesterol. Foods without cholesterol include: Grains and cereals. Vegetables. Fruits. Vegetable oils, such as olive, canola, and sunflower oil. Legumes, such as peas, beans, and lentils. Nuts and seeds. Egg whites. Summary The body needs cholesterol in small amounts, but too much cholesterol can cause damage to the arteries and heart. Most people should eat less than 200 milligrams (mg) of cholesterol a day. This information is not intended to replace advice given to you by your health care provider. Make sure you discuss any questions you have with your healthcare provider. Document Revised: 09/01/2019 Document Reviewed: 10/12/2019 Elsevier Patient Education  [place].

## 2020-11-16 NOTE — Progress Notes (Signed)
Chronic Care Management Pharmacy Note  11/16/2020 Name:  Lorraine Woods MRN:  [id number] DOB:  1946-04-02  Summary: addressed HLD (lifestyle modifications, no meds), and anxiety  Recommendations/Changes made from today's visit: none, due for repeat lipids at next PCP visit to evaluate progress  Plan: f/u with pharmacist in 1 year  Subjective: Lorraine Woods is an 75 y.o. year old female who is a primary patient of Metheney, Rene Kocher, MD.  The CCM team was consulted for assistance with disease management and care coordination needs.    Engaged with patient face to face for initial visit in response to provider referral for pharmacy case management and/or care coordination services.   Consent to Services:  The patient was given information about Chronic Care Management services, agreed to services, and gave verbal consent prior to initiation of services.  Please see initial visit note for detailed documentation.   Patient Care Team: Hali Marry, MD as PCP - General (Family Medicine) Guss Bunde, MD as Consulting Physician (Obstetrics and Gynecology) Darius Bump, [facility] as Pharmacist (Pharmacist)  Recent office visits: 11/02/20: Dr Madilyn Fireman (PCP): migraine, anxiety f/u   Objective:  Lab Results  Component Value Date   CREATININE 0.80 04/27/2020   CREATININE 0.84 04/28/2019   CREATININE 0.75 05/05/2018    Lab Results  Component Value Date   HGBA1C 5.4 11/02/2020   Last diabetic Eye exam: No results found for: HMDIABEYEEXA  Last diabetic Foot exam: No results found for: HMDIABFOOTEX      Component Value Date/Time   CHOL 216 (H) 04/27/2020 0000   TRIG 126 04/27/2020 0000   HDL 58 04/27/2020 0000   CHOLHDL 3.7 04/27/2020 0000   VLDL 23 04/18/2016 0945   LDLCALC 134 (H) 04/27/2020 0000    Hepatic Function Latest Ref Rng & Units 04/27/2020 04/28/2019 05/05/2018  Total Protein 6.1 - 8.1 g/dL 7.5 7.0 7.2  Albumin 3.6 - 5.1 g/dL - - -  AST 10 - 35 U/L _0 ALT 6 - 29 U/L _1 Alk Phosphatase 33 - 130 U/L - - -  Total Bilirubin 0.2 - 1.2 mg/dL 0.7 0.7 0.7  Bilirubin, Direct 0.0 - 0.3 mg/dL - - -    Lab Results  Component Value Date/Time   TSH 1.19 11/02/2020 11:47 AM   TSH 2.70 04/29/2017 07:41 AM   FREET4 0.8 01/29/2013 12:00 AM    CBC Latest Ref Rng & Units 11/02/2020 06/15/2020 04/28/2019  WBC 3.8 - 10.8 Thousand/uL 4.7 5.1 4.5  Hemoglobin 11.7 - 15.5 g/dL 14.6 14.9 14.7  Hematocrit 35.0 - 45.0 % 45.3(H) 44.0 43.8  Platelets 140 - 400 Thousand/uL 235 240 247    Lab Results  Component Value Date/Time   VD25OH 38 05/05/2018 09:30 AM   VD25OH 41 04/29/2017 07:41 AM    Clinical ASCVD: No  The 10-year ASCVD risk score Mikey Bussing DC Jr., et al., 2013) is: 12.8%   Values used to calculate the score:     Age: 39 years     Sex: Female     Is Non-Hispanic African American: No     Diabetic: No     Tobacco smoker: No     Systolic Blood Pressure: 130 mmHg     Is BP treated: No     HDL Cholesterol: 58 mg/dL     Total Cholesterol: 216 mg/dL     Social History   Tobacco Use  Smoking Status Never  Smokeless Tobacco Never  BP Readings from Last 3 Encounters:  11/02/20 (!) 119/58  06/15/20 135/71  05/16/20 131/74   Pulse Readings from Last 3 Encounters:  11/02/20 77  06/15/20 63  05/16/20 70   Wt Readings from Last 3 Encounters:  11/02/20 191 lb (86.6 kg)  06/15/20 192 lb 6.4 oz (87.3 kg)  05/16/20 191 lb (86.6 kg)    Assessment: Review of patient past medical history, allergies, medications, health status, including review of consultants reports, laboratory and other test data, was performed as part of comprehensive evaluation and provision of chronic care management services.   SDOH:  (Social Determinants of Health) assessments and interventions performed:    CCM Care Plan  Allergies  Allergen Reactions   Aspirin Other (See Comments)    Bruising.    Excedrin Migraine [Aspirin-Acetaminophen-Caffeine] Other  (See Comments)    Bruising     Medications Reviewed Today     Reviewed by Darius Bump, [facility] (Pharmacist) on 11/16/20 at [place] List Status: <None>   Medication Order Taking? Sig Documenting Provider Last Dose Status Informant  Calcium Carb-Cholecalciferol (CALTRATE 600+D3 PO) [id number] Yes Take 1 tablet by mouth daily. [provider] Taking Active   Cholecalciferol (VITAMIN D3) 2000 UNITS TABS [id number] Yes Take 2,000 Units by mouth every morning.  [provider] Taking Active Self  ipratropium (ATROVENT) 0.03 % nasal spray [id number] Yes SPRAY 2 SPRAYS INTO BOTH NOSTRILS EVERY 12 HOURS  Patient taking differently: Place 2 sprays into both nostrils daily.   Hali Marry, MD Taking Active   loratadine (CLARITIN) 10 MG tablet [id number] Yes Take 10 mg by mouth daily. [provider] Taking Active   minoxidil (ROGAINE) 2 % external solution [id number] Yes Apply topically daily as needed. [provider] Taking Active   Multiple Vitamin (MULTIVITAMIN WITH MINERALS) TABS tablet [id number] Yes Take 1 tablet by mouth every morning. [provider] Taking Active Self  naproxen sodium (ALEVE) 220 MG tablet [id number] Yes Take 220 mg by mouth. 2 times a day for two weeks. Take a break for a week.and continue the 2 times a day for two weeks again. [provider] Taking Active   omeprazole (PRILOSEC) 20 MG capsule [id number] No Take 20 mg by mouth 2 (two) times daily.  Patient not taking: Reported on 11/16/2020   [provider] Not Taking Active   sertraline (ZOLOFT) 100 MG tablet [id number] Yes TAKE 1 TABLET BY MOUTH EVERY DAY IN THE MORNING Hali Marry, MD Taking Active   SUMAtriptan (IMITREX) 100 MG tablet [id number] Yes TAKE 1 TABLET BY MOUTH EVERY 2HRS AS NEEDED FOR MIGRAINE. MAY REPEAT IN 2HRS IF HEADACHE PERSISTS Hali Marry, MD Taking Active             Patient Active Problem List   Diagnosis Date  Noted   Post-menopausal bleeding 05/16/2020   Tonsil stone 10/26/2019   Hearing loss of aging 10/26/2019   Hyperlipidemia 04/28/2019   Chondromalacia of left patellofemoral joint 08/25/2015   Osteopenia 06/30/2014   Solitary pulmonary nodule 08/04/2013   Social anxiety disorder 03/03/2013   Migraine without aura, not refractory 03/03/2013   Vitamin D deficiency 03/03/2013   Alopecia of scalp 03/03/2013   History of basal cell carcinoma of skin 03/03/2013   Liver cyst 03/03/2013   Varicose veins 02/26/2011   Tinnitus 02/24/2011   Generalized anxiety disorder 02/24/2011   Benign neoplasm of breast 02/24/2011    Immunization History  Administered Date(s) Administered   Fluad Quad(high Dose  65+) 02/11/2019   Influenza, High Dose Seasonal PF 03/12/2016, 03/07/2017, 03/06/2020   Influenza, Seasonal, Injecte, Preservative Fre 03/05/2014   Influenza,inj,Quad PF,6+ Mos 03/10/2018   Influenza-Unspecified 03/04/2013, 03/05/2015, 03/14/2016   PFIZER Comirnaty(Gray Top)Covid-19 Tri-Sucrose Vaccine 10/11/2020   PFIZER(Purple Top)SARS-COV-2 Vaccination 07/06/2019, 07/27/2019, 04/06/2020   Pneumococcal Conjugate-13 12/29/2013   Pneumococcal Polysaccharide-23 06/04/2005, 10/20/2015   Tdap 03/15/2009, 04/02/2020   Zoster, Live 01/03/2012, 01/30/2012    Conditions to be addressed/monitored: HLD and Anxiety  Care Plan : Medication Management  Updates made by Darius Bump, [place] since 11/16/2020 12:00 AM     Problem: HLD, anxiety      Long-Range Goal: Disease Progression Prevention   Note:   Current Barriers:  None at present  Pharmacist Clinical Goal(s):  Over the next 365 days, patient will maintain control of chronic conditions as evidenced by medication fill history, lab values, and vitals  through collaboration with PharmD and provider.   Interventions: 1:1 collaboration with Hali Marry, MD regarding development and update of comprehensive plan of care as evidenced by  provider attestation and co-signature Inter-disciplinary care team collaboration (see longitudinal plan of care) Comprehensive medication review performed; medication list updated in electronic medical record  Hyperlipidemia:  Uncontrolled; current treatment:lifestyle modifications only; LDL 134 ascvd 12.8%  Educated on LDL, ASCVD number, and guidance that age 74-45yrold with no hx diabetes recommends lifestyle modifications + moderate intensity statin, patient opted for conservative management w/lifestyle modification only Recommended continue current regimen  Anxiety:  Controlled; current treatment:Sertraline 1032m   Patient reports a few recent stressors, but overall pleased with her current medication effectiveness  Connected with no one for mental health support, pt respectfully declined, but I advised we can connect her if she requests in the future  Recommended continue current regimen  Patient Goals/Self-Care Activities Over the next 365 days, patient will:  take medications as prescribed and engage in dietary modifications by aiming for minimal saturated/trans fat, and choosing vegetables + lean meat as often as possible for cholesterol management  Follow Up Plan: Telephone follow up appointment with care management team member scheduled for:  1 year     Medication Assistance: None required.  Patient affirms current coverage meets needs.  [id number] [id number]: [phone]: [phone] Up:  Patient agrees to Care Plan and Follow-up.  Plan: Telephone follow up appointment with care management team member scheduled for:  1 year  KeeDarius Bump

## 2020-12-15 DIAGNOSIS — H1045 Other chronic allergic conjunctivitis: Secondary | ICD-10-CM | POA: Diagnosis not present

## 2020-12-15 DIAGNOSIS — H5203 Hypermetropia, bilateral: Secondary | ICD-10-CM | POA: Diagnosis not present

## 2020-12-15 DIAGNOSIS — H2513 Age-related nuclear cataract, bilateral: Secondary | ICD-10-CM | POA: Diagnosis not present

## 2020-12-15 DIAGNOSIS — H43813 Vitreous degeneration, bilateral: Secondary | ICD-10-CM | POA: Diagnosis not present

## 2020-12-19 DIAGNOSIS — L821 Other seborrheic keratosis: Secondary | ICD-10-CM | POA: Diagnosis not present

## 2020-12-19 DIAGNOSIS — D1801 Hemangioma of skin and subcutaneous tissue: Secondary | ICD-10-CM | POA: Diagnosis not present

## 2020-12-19 DIAGNOSIS — L905 Scar conditions and fibrosis of skin: Secondary | ICD-10-CM | POA: Diagnosis not present

## 2020-12-19 DIAGNOSIS — L814 Other melanin hyperpigmentation: Secondary | ICD-10-CM | POA: Diagnosis not present

## 2020-12-19 DIAGNOSIS — Z85828 Personal history of other malignant neoplasm of skin: Secondary | ICD-10-CM | POA: Diagnosis not present

## 2020-12-19 DIAGNOSIS — L648 Other androgenic alopecia: Secondary | ICD-10-CM | POA: Diagnosis not present

## 2021-02-01 DIAGNOSIS — M25562 Pain in left knee: Secondary | ICD-10-CM | POA: Diagnosis not present

## 2021-02-01 DIAGNOSIS — M25561 Pain in right knee: Secondary | ICD-10-CM | POA: Diagnosis not present

## 2021-02-01 DIAGNOSIS — G8929 Other chronic pain: Secondary | ICD-10-CM | POA: Diagnosis not present

## 2021-02-01 DIAGNOSIS — M7061 Trochanteric bursitis, right hip: Secondary | ICD-10-CM | POA: Diagnosis not present

## 2021-02-20 DIAGNOSIS — M170 Bilateral primary osteoarthritis of knee: Secondary | ICD-10-CM | POA: Diagnosis not present

## 2021-02-28 ENCOUNTER — Other Ambulatory Visit: Payer: Self-pay | Admitting: Family Medicine

## 2021-02-28 DIAGNOSIS — F411 Generalized anxiety disorder: Secondary | ICD-10-CM

## 2021-03-14 ENCOUNTER — Other Ambulatory Visit: Payer: Self-pay

## 2021-03-14 ENCOUNTER — Ambulatory Visit (INDEPENDENT_AMBULATORY_CARE_PROVIDER_SITE_OTHER): Payer: Medicare Other

## 2021-03-14 ENCOUNTER — Ambulatory Visit (INDEPENDENT_AMBULATORY_CARE_PROVIDER_SITE_OTHER): Payer: Medicare Other | Admitting: Family Medicine

## 2021-03-14 ENCOUNTER — Encounter: Payer: Self-pay | Admitting: Family Medicine

## 2021-03-14 VITALS — BP 128/58 | HR 70 | Ht 70.0 in | Wt 195.0 lb

## 2021-03-14 DIAGNOSIS — N644 Mastodynia: Secondary | ICD-10-CM

## 2021-03-14 DIAGNOSIS — R0781 Pleurodynia: Secondary | ICD-10-CM | POA: Diagnosis not present

## 2021-03-14 DIAGNOSIS — K2100 Gastro-esophageal reflux disease with esophagitis, without bleeding: Secondary | ICD-10-CM

## 2021-03-14 DIAGNOSIS — Z23 Encounter for immunization: Secondary | ICD-10-CM

## 2021-03-14 DIAGNOSIS — R0789 Other chest pain: Secondary | ICD-10-CM

## 2021-03-14 NOTE — Progress Notes (Signed)
Hi [place], chest x-ray actually looks okay no worrisome findings which is reassuring.  Please call imaging they need to correct the typo that says 75 year old

## 2021-03-14 NOTE — Progress Notes (Signed)
Acute Office Visit  Subjective:    Patient ID: Lorraine Woods, female    DOB: 1945-06-09, 75 y.o.   MRN: [id number]  No chief complaint on file.   HPI Patient is in today for axillary pain radiating down into her breast.  She says for couple months now she is been having some right axillary pain that radiates down to the lateral side of her breast and just underneath the nipple.  In the last week she started noticing the exact same sensation starting in the left axilla radiating around the breast.  She has not felt any lumps or bumps.  No fevers or chills.  She denies any change in bra wear etc.  She has not been doing any pushing pulling lifting twisting where she thinks she would have caused any injury.  She has had some tenderness over both ribs.  She has been having a little bit more heartburn than usual recently and has been taking an over-the-counter pill for that certain foods like pepperoni do seem to trigger her symptoms.  Past Medical History:  Diagnosis Date   Arthritis    Depression    GAD (generalized anxiety disorder)    History of basal cell carcinoma (BCC) excision    nose  x2  last one 2019   Hyperlipidemia    Knee pain, chronic    Migraine    Osteopenia    PMB (postmenopausal bleeding)    Seasonal allergies    Thickened endometrium    Wears glasses     Past Surgical History:  Procedure Laterality Date   APPENDECTOMY  39   age 89   BREAST LUMPECTOMY Right 2002   on HRT, fibromadenoma   CHOLECYSTECTOMY N/A 09/17/2013   Procedure: LAPAROSCOPIC CHOLECYSTECTOMY;  Surgeon: Stark Klein, MD;  Location: [facility];  Service: General;  Laterality: N/A;   COLONOSCOPY WITH PROPOFOL  last one 06-28-2017  dr Havery Moros   DILATATION & CURETTAGE/HYSTEROSCOPY WITH MYOSURE N/A 06/15/2020   Procedure: DILATATION & CURETTAGE/HYSTEROSCOPY WITH MYOSURE;  Surgeon: Armandina Stammer, DO;  Location: [facility];  Service: Gynecology;  Laterality: N/A;   LAPAROSCOPIC LIVER  CYST UNROOFING N/A 09/17/2013   Procedure: LAPAROSCOPIC LIVER CYST UNROOFING;  Surgeon: Stark Klein, MD;  Location: [facility];  Service: General;  Laterality: N/A;   MOHS SURGERY  x2  last one approx. 2019   bcc of nose   ORIF ANKLE FRACTURE Left 2000   ORIF WRIST FRACTURE Right 10-30-2016  @[facility]    sclerotherapy on left leg  2013   TUBAL LIGATION  1982    Family History  Problem Relation Age of Onset   Brain cancer Mother 28   Cancer Mother    Diabetes Father    Hypertension Father    CAD Father    Heart disease Father    Colon polyps Son        Puetz-jehgers syndrome   Colon cancer Neg Hx    Esophageal cancer Neg Hx    Rectal cancer Neg Hx    Stomach cancer Neg Hx     Social History   Socioeconomic History   Marital status: Married    Spouse name: John    Number of children: 2   Years of education: 16   Highest education level: Associate degree: academic program  Occupational History   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 1 cup coffe in aM. Walks in her neighborhood for exercise. Has officially retired from working. Goes antique shopping with her husband.   Social Determinants of Health   Financial Resource Strain: Low Risk    Difficulty of Paying Living Expenses: Not hard at all  Food Insecurity: No Food Insecurity   Worried About Charity fundraiser in the Last Year: Never true   [place] in the Last Year: Never true  Transportation Needs: No Transportation Needs   Lack of Transportation  (Medical): No   Lack of Transportation (Non-Medical): No  Physical Activity: Insufficiently Active   Days of Exercise per Week: 5 days   Minutes of Exercise per Session: 20 min  Stress: No Stress Concern Present   Feeling of Stress : Not at all  Social Connections: Moderately Integrated   Frequency of Communication with Friends and Family: Once a week   Frequency of Social Gatherings with Friends and Family: Once a week   Attends Religious Services: More than 4 times per year   Active Member of [employer] or Organizations: Yes   Attends Music therapist: Not on file   Marital Status: Married  Human resources officer Violence: Not At Risk   Fear of Current or Ex-Partner: No   Emotionally Abused: No   Physically Abused: No   Sexually Abused: No    Outpatient Medications Prior to Visit  Medication Sig Dispense Refill   Calcium Carb-Cholecalciferol (CALTRATE 600+D3 PO) Take 1 tablet by mouth daily.     ipratropium (ATROVENT) 0.03 % nasal spray SPRAY 2 SPRAYS INTO BOTH NOSTRILS EVERY 12 HOURS (Patient taking differently: Place 2 sprays into both nostrils daily.) 30 mL 11   loratadine (CLARITIN) 10 MG tablet Take 10 mg by mouth daily.     minoxidil (ROGAINE) 2 % external solution Apply topically daily as needed.     Multiple Vitamin (MULTIVITAMIN WITH MINERALS) TABS tablet Take 1 tablet by mouth every morning.     naproxen sodium (ALEVE) 220 MG tablet Take 220 mg by mouth. 2 times a day for two weeks. Take a break for a week.and continue the 2 times a day for two weeks again.     omeprazole (PRILOSEC) 20 MG capsule Take 20 mg by mouth 2 (two) times daily. (Patient not taking: Reported on 11/16/2020)     sertraline (ZOLOFT) 100 MG tablet TAKE 1 TABLET BY MOUTH EVERY DAY IN THE MORNING 90 tablet 1   SUMAtriptan (IMITREX) 100 MG tablet TAKE 1 TABLET BY MOUTH EVERY 2HRS AS NEEDED FOR MIGRAINE. MAY REPEAT IN 2HRS IF HEADACHE PERSISTS 9 tablet 6   Cholecalciferol (VITAMIN D3) 2000 UNITS TABS Take  2,000 Units by mouth every morning.      No facility-administered medications prior to visit.    Allergies  Allergen Reactions   Aspirin Other (See Comments)    Bruising.    Excedrin Migraine [Aspirin-Acetaminophen-Caffeine] Other (See Comments)    Bruising     Review of Systems     Objective:    Physical Exam Vitals  reviewed. Exam conducted with a chaperone present.  Constitutional:      Appearance: Normal appearance. She is well-developed.  HENT:     Head: Normocephalic and atraumatic.  Eyes:     Conjunctiva/sclera: Conjunctivae normal.  Cardiovascular:     Rate and Rhythm: Normal rate.  Pulmonary:     Effort: Pulmonary effort is normal.  Chest:     Chest wall: No mass.  Breasts:    Right: Normal. No swelling, mass, nipple discharge, skin change or tenderness.     Left: Normal. No swelling, mass, nipple discharge, skin change or tenderness.  Lymphadenopathy:     Upper Body:     Right upper body: No supraclavicular, axillary or pectoral adenopathy.     Left upper body: No supraclavicular, axillary or pectoral adenopathy.  Skin:    General: Skin is dry.     Coloration: Skin is not pale.  Neurological:     Mental Status: She is alert and oriented to person, place, and time.  Psychiatric:        Behavior: Behavior normal.    BP (!) 128/58   Pulse 70   Ht 5\' 10"  (1.778 m)   Wt 195 lb (88.5 kg)   SpO2 99%   BMI 27.98 kg/m  Wt Readings from Last 3 Encounters:  03/14/21 195 lb (88.5 kg)  11/02/20 191 lb (86.6 kg)  06/15/20 192 lb 6.4 oz (87.3 kg)    Health Maintenance Due  Topic Date Due   Zoster Vaccines- Shingrix (1 of 2) Never done   COVID-19 Vaccine (5 - Booster for Pfizer series) 02/11/2021    There are no preventive care reminders to display for this patient.   Lab Results  Component Value Date   TSH 1.19 11/02/2020   Lab Results  Component Value Date   WBC 4.7 11/02/2020   HGB 14.6 11/02/2020   HCT 45.3 (H) 11/02/2020   MCV 87.8  11/02/2020   PLT 235 11/02/2020   Lab Results  Component Value Date   NA 144 04/27/2020   K 4.5 04/27/2020   CO2 29 04/27/2020   GLUCOSE 108 (H) 04/27/2020   BUN 20 04/27/2020   CREATININE 0.80 04/27/2020   BILITOT 0.7 04/27/2020   ALKPHOS 48 04/18/2016   AST 18 04/27/2020   ALT 11 04/27/2020   PROT 7.5 04/27/2020   ALBUMIN 4.3 04/18/2016   CALCIUM 9.7 04/27/2020   Lab Results  Component Value Date   CHOL 216 (H) 04/27/2020   Lab Results  Component Value Date   HDL 58 04/27/2020   Lab Results  Component Value Date   LDLCALC 134 (H) 04/27/2020   Lab Results  Component Value Date   TRIG 126 04/27/2020   Lab Results  Component Value Date   CHOLHDL 3.7 04/27/2020   Lab Results  Component Value Date   HGBA1C 5.4 11/02/2020       Assessment & Plan:   Problem List Items Addressed This Visit   None Visit Diagnoses     Atypical chest pain    -  Primary   Relevant Orders   EKG 12-Lead   DG Chest 2 View   Need for immunization against influenza       Relevant Orders   Flu Vaccine QUAD High Dose(Fluad) (Completed)   Rib pain       Relevant Orders   DG Chest 2 View   Breast pain in female       Relevant Orders   US BREAST COMPLETE  UNI LEFT INC AXILLA   US BREAST COMPLETE UNI RIGHT INC AXILLA   MM Digital Diagnostic Bilat   Gastroesophageal reflux disease with esophagitis without hemorrhage          Bilateral axillary pain that is radiating down and underneath each breast.  We will get a start with a plain chest x-ray since she does have some soreness over the lower ribs bilaterally just to rule out any type of lytic lesions etc.  We will also get diagnostic mammogram and ultrasound that can also look into the axilla do not palpate any lymphadenopathy or masses on exam but I want a make sure there is nothing underlying that is concerning.  Consider this still could be musculoskeletal.  Cardiac is a possibility but I am less concerned since the pain starts in  the axilla and is bilateral but certainly is a consideration.  EKG shows rate of 69 bpm, normal sinus rhythm with no acute ST-T wave changes.  No other concerning findings.  I think I would like to start by evaluating the chest and breast tissue first and then we can also circle back to cardiac if nothing else is showing up.  GERD-she has been having a little bit more reflux lately just avoid greasy and spicy foods.  Okay to take the over-the-counter tab she may need to take it daily for at least a couple weeks instead of as needed just to see if this makes a difference if not improving then please let me know daughter think this is related to the axillary and breast pain though.  No orders of the defined types were placed in this encounter.    [name], MD

## 2021-03-16 ENCOUNTER — Encounter: Payer: Self-pay | Admitting: Family Medicine

## 2021-03-16 DIAGNOSIS — Z780 Asymptomatic menopausal state: Secondary | ICD-10-CM

## 2021-03-16 DIAGNOSIS — N644 Mastodynia: Secondary | ICD-10-CM

## 2021-03-20 ENCOUNTER — Ambulatory Visit: Payer: Medicare Other | Admitting: Family Medicine

## 2021-03-30 DIAGNOSIS — N6315 Unspecified lump in the right breast, overlapping quadrants: Secondary | ICD-10-CM | POA: Diagnosis not present

## 2021-03-30 DIAGNOSIS — E2839 Other primary ovarian failure: Secondary | ICD-10-CM | POA: Diagnosis not present

## 2021-03-30 DIAGNOSIS — Z780 Asymptomatic menopausal state: Secondary | ICD-10-CM | POA: Diagnosis not present

## 2021-03-30 LAB — HM DEXA SCAN

## 2021-03-31 DIAGNOSIS — Z23 Encounter for immunization: Secondary | ICD-10-CM | POA: Diagnosis not present

## 2021-04-04 ENCOUNTER — Telehealth: Payer: Self-pay | Admitting: Family Medicine

## 2021-04-04 NOTE — Telephone Encounter (Signed)
Please call ptl DEXA shows T score of -0.8 which is normal. Recommend repeat  one density in 3-5 years.

## 2021-04-05 NOTE — Telephone Encounter (Signed)
Pt informed of results.  Pt expressed understanding and is agreeable.  T. Viraaj Vorndran, CMA 

## 2021-05-04 ENCOUNTER — Other Ambulatory Visit: Payer: Self-pay

## 2021-05-04 ENCOUNTER — Ambulatory Visit (INDEPENDENT_AMBULATORY_CARE_PROVIDER_SITE_OTHER): Payer: Medicare Other | Admitting: Family Medicine

## 2021-05-04 ENCOUNTER — Encounter: Payer: Self-pay | Admitting: Family Medicine

## 2021-05-04 VITALS — BP 120/53 | HR 76 | Ht 70.0 in | Wt 195.0 lb

## 2021-05-04 DIAGNOSIS — F4010 Social phobia, unspecified: Secondary | ICD-10-CM

## 2021-05-04 DIAGNOSIS — N760 Acute vaginitis: Secondary | ICD-10-CM | POA: Diagnosis not present

## 2021-05-04 DIAGNOSIS — G43009 Migraine without aura, not intractable, without status migrainosus: Secondary | ICD-10-CM

## 2021-05-04 DIAGNOSIS — F411 Generalized anxiety disorder: Secondary | ICD-10-CM

## 2021-05-04 DIAGNOSIS — K219 Gastro-esophageal reflux disease without esophagitis: Secondary | ICD-10-CM | POA: Insufficient documentation

## 2021-05-04 DIAGNOSIS — R7309 Other abnormal glucose: Secondary | ICD-10-CM

## 2021-05-04 DIAGNOSIS — E785 Hyperlipidemia, unspecified: Secondary | ICD-10-CM | POA: Diagnosis not present

## 2021-05-04 DIAGNOSIS — K2100 Gastro-esophageal reflux disease with esophagitis, without bleeding: Secondary | ICD-10-CM

## 2021-05-04 LAB — CBC
HCT: 41.4 % (ref 35.0–45.0)
Hemoglobin: 13.3 g/dL (ref 11.7–15.5)
MCH: 27 pg (ref 27.0–33.0)
MCHC: 32.1 g/dL (ref 32.0–36.0)
MCV: 84 fL (ref 80.0–100.0)
MPV: 10.7 fL (ref 7.5–12.5)
Platelets: 267 10*3/uL (ref 140–400)
RBC: 4.93 10*6/uL (ref 3.80–5.10)
RDW: 12.6 % (ref 11.0–15.0)
WBC: 4.5 10*3/uL (ref 3.8–10.8)

## 2021-05-04 LAB — COMPLETE METABOLIC PANEL WITH GFR
AG Ratio: 1.6 (calc) (ref 1.0–2.5)
ALT: 15 U/L (ref 6–29)
AST: 22 U/L (ref 10–35)
Albumin: 4.3 g/dL (ref 3.6–5.1)
Alkaline phosphatase (APISO): 43 U/L (ref 37–153)
BUN: 18 mg/dL (ref 7–25)
CO2: 32 mmol/L (ref 20–32)
Calcium: 9.2 mg/dL (ref 8.6–10.4)
Chloride: 106 mmol/L (ref 98–110)
Creat: 0.72 mg/dL (ref 0.60–1.00)
Globulin: 2.7 g/dL (calc) (ref 1.9–3.7)
Glucose, Bld: 105 mg/dL — ABNORMAL HIGH (ref 65–99)
Potassium: 4.3 mmol/L (ref 3.5–5.3)
Sodium: 144 mmol/L (ref 135–146)
Total Bilirubin: 0.6 mg/dL (ref 0.2–1.2)
Total Protein: 7 g/dL (ref 6.1–8.1)
eGFR: 88 mL/min/{1.73_m2} (ref >=60)

## 2021-05-04 LAB — LIPID PANEL W/REFLEX DIRECT LDL
Cholesterol: 210 mg/dL — ABNORMAL HIGH (ref <200)
HDL: 60 mg/dL (ref >=50)
LDL Cholesterol (Calc): 127 mg/dL (calc) — ABNORMAL HIGH
Non-HDL Cholesterol (Calc): 150 mg/dL (calc) — ABNORMAL HIGH (ref <130)
Total CHOL/HDL Ratio: 3.5 (calc) (ref <5.0)
Triglycerides: 119 mg/dL (ref <150)

## 2021-05-04 MED ORDER — OMEPRAZOLE 20 MG PO CPDR: 20.0000 mg | DELAYED_RELEASE_CAPSULE | Freq: Every day | ORAL | 1 refills | Status: DC

## 2021-05-04 MED ORDER — SUMATRIPTAN SUCCINATE 100 MG PO TABS: ORAL_TABLET | ORAL | 6 refills | Status: DC

## 2021-05-04 NOTE — Progress Notes (Signed)
Established Patient Office Visit  Subjective:  Patient ID: Lorraine Woods, female    DOB: Jun 01, 1946  Age: 75 y.o. MRN: [id number]  CC:  Chief Complaint  Patient presents with   mood    HPI Lorraine Woods presents for GAD/Social anxiety d/o.  Overall she feels like she is doing well on her current regimen she has a few stressors going on right now.  Her husband has multiple medical problems and he has not felt well today and she is just not sure what is going on with him.  Her son who has Peutz-Jeghers was hospitalized and almost died.  He has significant GI bleed and required 12 units of transfusion and was in the ICU recently he is doing better which is great.  Reports some vaginal itching she is tried using the over-the-counter Monistat cream and it helps temporarily.  No abnormal discharge.  Also like me to check her left ear she has been having some pain in that ear on and off.  No hearing change.  No drainage or fluid from the ear.  No recent sinus symptoms.  Some I saw her she was having some chest discomfort radiating down into her stomach.  She started taking her omeprazole about 20 to 30 minutes before first meal of the day and says it made a big difference in fact her symptoms went completely away she then stopped the medication and they started to come back so she has restarted it and would like a refill on the medication.  She also went for her mammogram and they found a little cyst.  She thinks she actually hit her breast while working and had noticed a bruise in that area but she is on the schedule for a recall for further evaluation.  Past Medical History:  Diagnosis Date   Arthritis    Depression    GAD (generalized anxiety disorder)    History of basal cell carcinoma (BCC) excision    nose  x2  last one 2019   Hyperlipidemia    Knee pain, chronic    Migraine    Osteopenia    PMB (postmenopausal bleeding)    Seasonal allergies    Thickened endometrium    Wears  glasses     Past Surgical History:  Procedure Laterality Date   APPENDECTOMY  30   age 89   BREAST LUMPECTOMY Right 2002   on HRT, fibromadenoma   CHOLECYSTECTOMY N/A 09/17/2013   Procedure: LAPAROSCOPIC CHOLECYSTECTOMY;  Surgeon: Stark Klein, MD;  Location: [facility];  Service: General;  Laterality: N/A;   COLONOSCOPY WITH PROPOFOL  last one 06-28-2017  dr Havery Moros   DILATATION & CURETTAGE/HYSTEROSCOPY WITH MYOSURE N/A 06/15/2020   Procedure: DILATATION & CURETTAGE/HYSTEROSCOPY WITH MYOSURE;  Surgeon: Armandina Stammer, DO;  Location: [facility];  Service: Gynecology;  Laterality: N/A;   LAPAROSCOPIC LIVER CYST UNROOFING N/A 09/17/2013   Procedure: LAPAROSCOPIC LIVER CYST UNROOFING;  Surgeon: Stark Klein, MD;  Location: [facility];  Service: General;  Laterality: N/A;   MOHS SURGERY  x2  last one approx. 2019   bcc of nose   ORIF ANKLE FRACTURE Left 2000   ORIF WRIST FRACTURE Right 10-30-2016  $[email or web address]'@[email or web address]$    sclerotherapy on left leg  2013   TUBAL LIGATION  1982    Family History  Problem Relation Age of Onset   Brain cancer Mother 75   Cancer Mother    Diabetes Father    Hypertension Father    CAD  Father    Heart disease Father    Colon polyps Son        Puetz-jehgers syndrome   Colon cancer Neg Hx    Esophageal cancer Neg Hx    Rectal cancer Neg Hx    Stomach cancer Neg Hx     Social History   Socioeconomic History   Marital status: Married    Spouse name: John    Number of children: 2   Years of education: 16   Highest education level: Associate degree: academic program  Occupational History   Occupation: [employer] laboratory technician: [employer]    Comment: retired  Tobacco Use   Smoking status: Never   Smokeless tobacco: Never  Vaping Use   Vaping Use: Never used  Substance and Sexual Activity   Alcohol use: Yes    Alcohol/week: 0.0 standard drinks    Comment: 2 wine per month   Drug use: Never   Sexual activity: Not Currently     Partners: Male    Birth control/protection: Post-menopausal  Other Topics Concern   Not on file  Social History Narrative    Daily caffeine. 1 cup coffe in aM. Walks in her neighborhood for exercise. Has officially retired from working. Goes antique shopping with her husband.   Social Determinants of Health   Financial Resource Strain: Low Risk    Difficulty of Paying Living Expenses: Not hard at all  Food Insecurity: No Food Insecurity   Worried About Charity fundraiser in the Last Year: Never true   [place] in the Last Year: Never true  Transportation Needs: No Transportation Needs   Lack of Transportation (Medical): No   Lack of Transportation (Non-Medical): No  Physical Activity: Insufficiently Active   Days of Exercise per Week: 5 days   Minutes of Exercise per Session: 20 min  Stress: No Stress Concern Present   Feeling of Stress : Not at all  Social Connections: Moderately Integrated   Frequency of Communication with Friends and Family: Once a week   Frequency of Social Gatherings with Friends and Family: Once a week   Attends Religious Services: More than 4 times per year   Active Member of [employer] or Organizations: Yes   Attends Music therapist: Not on file   Marital Status: Married  Human resources officer Violence: Not At Risk   Fear of Current or Ex-Partner: No   Emotionally Abused: No   Physically Abused: No   Sexually Abused: No    Outpatient Medications Prior to Visit  Medication Sig Dispense Refill   Calcium Carb-Cholecalciferol (CALTRATE 600+D3 PO) Take 1 tablet by mouth daily.     ipratropium (ATROVENT) 0.03 % nasal spray SPRAY 2 SPRAYS INTO BOTH NOSTRILS EVERY 12 HOURS (Patient taking differently: Place 2 sprays into both nostrils daily.) 30 mL 11   loratadine (CLARITIN) 10 MG tablet Take 10 mg by mouth as needed for allergies.     minoxidil (ROGAINE) 2 % external solution Apply topically daily as needed.     Multiple Vitamin (MULTIVITAMIN  WITH MINERALS) TABS tablet Take 1 tablet by mouth every morning.     sertraline (ZOLOFT) 100 MG tablet TAKE 1 TABLET BY MOUTH EVERY DAY IN THE MORNING 90 tablet 1   omeprazole (PRILOSEC) 20 MG capsule Take 20 mg by mouth daily.     SUMAtriptan (IMITREX) 100 MG tablet TAKE 1 TABLET BY MOUTH EVERY 2HRS AS NEEDED FOR MIGRAINE. MAY REPEAT IN 2HRS  IF HEADACHE PERSISTS 9 tablet 6   loratadine (CLARITIN) 10 MG tablet Take 10 mg by mouth daily.     naproxen sodium (ALEVE) 220 MG tablet Take 220 mg by mouth. 2 times a day for two weeks. Take a break for a week.and continue the 2 times a day for two weeks again.     omeprazole (PRILOSEC) 20 MG capsule Take 20 mg by mouth 2 (two) times daily. (Patient not taking: Reported on 11/16/2020)     No facility-administered medications prior to visit.    Allergies  Allergen Reactions   Aspirin Other (See Comments)    Bruising.    Excedrin Migraine [Aspirin-Acetaminophen-Caffeine] Other (See Comments)    Bruising     ROS Review of Systems    Objective:    Physical Exam Constitutional:      Appearance: Normal appearance. She is well-developed.  HENT:     Head: Normocephalic and atraumatic.  Cardiovascular:     Rate and Rhythm: Normal rate and regular rhythm.     Heart sounds: Normal heart sounds.  Pulmonary:     Effort: Pulmonary effort is normal.     Breath sounds: Normal breath sounds.  Skin:    General: Skin is warm and dry.  Neurological:     Mental Status: She is alert and oriented to person, place, and time.  Psychiatric:        Behavior: Behavior normal.    BP (!) 120/53   Pulse 76   Ht $R'5\' 10"'LU$  (1.778 m)   Wt 195 lb (88.5 kg)   SpO2 100%   BMI 27.98 kg/m  Wt Readings from Last 3 Encounters:  05/04/21 195 lb (88.5 kg)  03/14/21 195 lb (88.5 kg)  11/02/20 191 lb (86.6 kg)     Health Maintenance Due  Topic Date Due   Zoster Vaccines- Shingrix (1 of 2) Never done    There are no preventive care reminders to display for this  patient.  Lab Results  Component Value Date   TSH 1.19 11/02/2020   Lab Results  Component Value Date   WBC 4.5 05/04/2021   HGB 13.3 05/04/2021   HCT 41.4 05/04/2021   MCV 84.0 05/04/2021   PLT 267 05/04/2021   Lab Results  Component Value Date   NA 144 05/04/2021   K 4.3 05/04/2021   CO2 32 05/04/2021   GLUCOSE 105 (H) 05/04/2021   BUN 18 05/04/2021   CREATININE 0.72 05/04/2021   BILITOT 0.6 05/04/2021   ALKPHOS 48 04/18/2016   AST 22 05/04/2021   ALT 15 05/04/2021   PROT 7.0 05/04/2021   ALBUMIN 4.3 04/18/2016   CALCIUM 9.2 05/04/2021   EGFR 88 05/04/2021   Lab Results  Component Value Date   CHOL 210 (H) 05/04/2021   Lab Results  Component Value Date   HDL 60 05/04/2021   Lab Results  Component Value Date   LDLCALC 127 (H) 05/04/2021   Lab Results  Component Value Date   TRIG 119 05/04/2021   Lab Results  Component Value Date   CHOLHDL 3.5 05/04/2021   Lab Results  Component Value Date   HGBA1C 5.4 11/02/2020      Assessment & Plan:   Problem List Items Addressed This Visit       Cardiovascular and Mediastinum   Migraine without aura, not refractory    Due for refills.        Relevant Medications   SUMAtriptan (IMITREX) 100 MG tablet  Digestive   GERD (gastroesophageal reflux disease)    PPI completely resolved her symptoms.  We will refill the medication today.      Relevant Medications   omeprazole (PRILOSEC) 20 MG capsule     Other   Social anxiety disorder   Hyperlipidemia   Relevant Orders   Lipid Panel w/reflex Direct LDL (Completed)   COMPLETE METABOLIC PANEL WITH GFR (Completed)   CBC (Completed)   Generalized anxiety disorder - Primary    Happy with with current regimen.  We will continue current dosing.  Plan to follow back up in 6 months.  She has had some recent increased stressors just encouraged her to keep an eye on that and let me know if we can be helpful.  PHQ-9 score of 3 and GAD-7 score of 4.       Relevant Orders   Lipid Panel w/reflex Direct LDL (Completed)   COMPLETE METABOLIC PANEL WITH GFR (Completed)   CBC (Completed)   Other Visit Diagnoses     Abnormal glucose       Relevant Orders   Lipid Panel w/reflex Direct LDL (Completed)   COMPLETE METABOLIC PANEL WITH GFR (Completed)   CBC (Completed)   Acute vaginitis       Relevant Medications   omeprazole (PRILOSEC) 20 MG capsule   Other Relevant Orders   WET PREP FOR TRICH, YEAST, CLUE       Vaginitis-we will get wet prep today and call with results.  Meds ordered this encounter  Medications   SUMAtriptan (IMITREX) 100 MG tablet    Sig: TAKE 1 TABLET BY MOUTH EVERY 2HRS AS NEEDED FOR MIGRAINE. MAY REPEAT IN 2HRS IF HEADACHE PERSISTS    Dispense:  9 tablet    Refill:  6   omeprazole (PRILOSEC) 20 MG capsule    Sig: Take 1 capsule (20 mg total) by mouth daily.    Dispense:  90 capsule    Refill:  1    Follow-up: Return in about 6 months (around 11/02/2021) for mood and medications.  [name], MD

## 2021-05-04 NOTE — Assessment & Plan Note (Signed)
Happy with with current regimen.  We will continue current dosing.  Plan to follow back up in 6 months.  She has had some recent increased stressors just encouraged her to keep an eye on that and let me know if we can be helpful.  PHQ-9 score of 3 and GAD-7 score of 4.

## 2021-05-04 NOTE — Assessment & Plan Note (Signed)
PPI completely resolved her symptoms.  We will refill the medication today.

## 2021-05-04 NOTE — Assessment & Plan Note (Signed)
Due for refills.

## 2021-05-05 LAB — WET PREP FOR TRICH, YEAST, CLUE
MICRO NUMBER:: 12703520
Specimen Quality: ADEQUATE

## 2021-05-05 MED ORDER — FLUCONAZOLE 150 MG PO TABS: 150.0000 mg | ORAL_TABLET | Freq: Once | ORAL | 0 refills | Status: AC

## 2021-05-05 NOTE — Addendum Note (Signed)
Addended by: [name] D on: 05/05/2021 11:20 AM   Modules accepted: Orders

## 2021-05-05 NOTE — Progress Notes (Signed)
Hi Erlinda, no sign of yeast infection or bacterial overgrowth which is good news.  I think you had said you would use a little over-the-counter cream.  And that may have decreased the yeast count.  So we will go ahead and send over Diflucan if you are still having some itching.

## 2021-06-01 DIAGNOSIS — R928 Other abnormal and inconclusive findings on diagnostic imaging of breast: Secondary | ICD-10-CM | POA: Diagnosis not present

## 2021-06-22 DIAGNOSIS — M25561 Pain in right knee: Secondary | ICD-10-CM | POA: Diagnosis not present

## 2021-07-26 ENCOUNTER — Telehealth: Payer: Self-pay | Admitting: Family Medicine

## 2021-07-26 NOTE — Telephone Encounter (Signed)
This call patient and let her know that we received a request from [facility] for preoperative clearance this includes a general physical, EKG, urine test and blood work.  Please schedule at your convenience.  It looks like she is scheduled for her surgery in May.

## 2021-07-26 NOTE — Telephone Encounter (Signed)
I have pt scheduled for 09/19/21 surgical Clearance

## 2021-09-11 DIAGNOSIS — Z1231 Encounter for screening mammogram for malignant neoplasm of breast: Secondary | ICD-10-CM | POA: Diagnosis not present

## 2021-09-11 LAB — HM MAMMOGRAPHY

## 2021-09-18 ENCOUNTER — Ambulatory Visit (INDEPENDENT_AMBULATORY_CARE_PROVIDER_SITE_OTHER): Payer: Medicare Other | Admitting: Family Medicine

## 2021-09-18 DIAGNOSIS — Z0000 Encounter for general adult medical examination without abnormal findings: Secondary | ICD-10-CM

## 2021-09-18 NOTE — Patient Instructions (Addendum)
?MEDICARE ANNUAL WELLNESS VISIT ?Health Maintenance Summary and Written Plan of Care ? ?Ms. Ainley , ? ?Thank you for allowing me to perform your Medicare Annual Wellness Visit and for your ongoing commitment to your health.  ? ?Health Maintenance & Immunization History ?Health Maintenance  ?Topic Date Due  ? Zoster Vaccines- Shingrix (1 of 2) 12/18/2021 (Originally 08/09/1964)  ? INFLUENZA VACCINE  01/02/2022  ? COLONOSCOPY (Pts 45-63yr Insurance coverage will need to be confirmed)  06/28/2022  ? TETANUS/TDAP  04/02/2030  ? Pneumonia Vaccine 75 Years old  Completed  ? DEXA SCAN  Completed  ? COVID-19 Vaccine  Completed  ? Hepatitis C Screening  Completed  ? HPV VACCINES  Aged Out  ? ?Immunization History  ?Administered Date(s) Administered  ? Fluad Quad(high Dose 65+) 02/11/2019, 03/14/2021  ? Influenza, High Dose Seasonal PF 03/12/2016, 03/07/2017, 03/06/2020  ? Influenza, Seasonal, Injecte, Preservative Fre 03/05/2014  ? Influenza,inj,Quad PF,6+ Mos 03/10/2018  ? Influenza-Unspecified 03/04/2013, 03/05/2015, 03/14/2016  ? PFIZER Comirnaty(Gray Top)Covid-19 Tri-Sucrose Vaccine 10/11/2020  ? PFIZER(Purple Top)SARS-COV-2 Vaccination 07/06/2019, 07/27/2019, 04/06/2020  ? PPension scheme manager181yr& up 03/31/2021  ? Pneumococcal Conjugate-13 12/29/2013  ? Pneumococcal Polysaccharide-23 06/04/2005, 10/20/2015  ? Tdap 03/15/2009, 04/02/2020  ? Zoster, Live 01/03/2012, 01/30/2012  ? ? ?These are the patient goals that we discussed: ? Goals Addressed   ? ?  ?  ?  ?  ?  ? This Visit's Progress  ?   Patient Stated (pt-stated)     ?   Would like to be able to go back to silver sneakers after her knee replacement. ?  ? ?  ?  ? ?This is a list of Health Maintenance Items that are overdue or due now: ?Shingrix vaccine ?  ? ?Orders/Referrals Placed Today: ?No orders of the defined types were placed in this encounter. ? ?(Contact our referral department at [phone] you have not spoken with someone  about your referral appointment within the next 5 days)  ? ? ?Follow-up Plan ?Follow-up with MeHali MarryMD as planned ?Schedule your shingrix vaccine at your pharmacy. ?Medicare wellness visit in one year. ?Patient will access AVS on my chart. ? ? ? ?  ?Health Maintenance, Female ?Adopting a healthy lifestyle and getting preventive care are important in promoting health and wellness. Ask your health care provider about: ?The right schedule for you to have regular tests and exams. ?Things you can do on your own to prevent diseases and keep yourself healthy. ?What should I know about diet, weight, and exercise? ?Eat a healthy diet ? ?Eat a diet that includes plenty of vegetables, fruits, low-fat dairy products, and lean protein. ?Do not eat a lot of foods that are high in solid fats, added sugars, or sodium. ?Maintain a healthy weight ?Body mass index (BMI) is used to identify weight problems. It estimates body fat based on height and weight. Your health care provider can help determine your BMI and help you achieve or maintain a healthy weight. ?Get regular exercise ?Get regular exercise. This is one of the most important things you can do for your health. Most adults should: ?Exercise for at least 150 minutes each week. The exercise should increase your heart rate and make you sweat (moderate-intensity exercise). ?Do strengthening exercises at least twice a week. This is in addition to the moderate-intensity exercise. ?Spend less time sitting. Even light physical activity can be beneficial. ?Watch cholesterol and blood lipids ?Have your blood tested for lipids and cholesterol at  75 years of age, then have this test every 5 years. ?Have your cholesterol levels checked more often if: ?Your lipid or cholesterol levels are high. ?You are older than 74 years of age. ?You are at high risk for heart disease. ?What should I know about cancer screening? ?Depending on your health history and family history, you  may need to have cancer screening at various ages. This may include screening for: ?Breast cancer. ?Cervical cancer. ?Colorectal cancer. ?Skin cancer. ?Lung cancer. ?What should I know about heart disease, diabetes, and high blood pressure? ?Blood pressure and heart disease ?High blood pressure causes heart disease and increases the risk of stroke. This is more likely to develop in people who have high blood pressure readings or are overweight. ?Have your blood pressure checked: ?Every 3-5 years if you are 31-14 years of age. ?Every year if you are 64 years old or older. ?Diabetes ?Have regular diabetes screenings. This checks your fasting blood sugar level. Have the screening done: ?Once every three years after age 62 if you are at a normal weight and have a low risk for diabetes. ?More often and at a younger age if you are overweight or have a high risk for diabetes. ?What should I know about preventing infection? ?Hepatitis B ?If you have a higher risk for hepatitis B, you should be screened for this virus. Talk with your health care provider to find out if you are at risk for hepatitis B infection. ?Hepatitis C ?Testing is recommended for: ?Everyone born from [birth year] through 1964. ?Anyone with known risk factors for hepatitis C. ?Sexually transmitted infections (STIs) ?Get screened for STIs, including gonorrhea and chlamydia, if: ?You are sexually active and are younger than 75 years of age. ?You are older than 75 years of age and your health care provider tells you that you are at risk for this type of infection. ?Your sexual activity has changed since you were last screened, and you are at increased risk for chlamydia or gonorrhea. Ask your health care provider if you are at risk. ?Ask your health care provider about whether you are at high risk for HIV. Your health care provider may recommend a prescription medicine to help prevent HIV infection. If you choose to take medicine to prevent HIV, you should first  get tested for HIV. You should then be tested every 3 months for as long as you are taking the medicine. ?Pregnancy ?If you are about to stop having your period (premenopausal) and you may become pregnant, seek counseling before you get pregnant. ?Take 400 to 800 micrograms (mcg) of folic acid every day if you become pregnant. ?Ask for birth control (contraception) if you want to prevent pregnancy. ?Osteoporosis and menopause ?Osteoporosis is a disease in which the bones lose minerals and strength with aging. This can result in bone fractures. If you are 57 years old or older, or if you are at risk for osteoporosis and fractures, ask your health care provider if you should: ?Be screened for bone loss. ?Take a calcium or vitamin D supplement to lower your risk of fractures. ?Be given hormone replacement therapy (HRT) to treat symptoms of menopause. ?Follow these instructions at home: ?Alcohol use ?Do not drink alcohol if: ?Your health care provider tells you not to drink. ?You are pregnant, may be pregnant, or are planning to become pregnant. ?If you drink alcohol: ?Limit how much you have to: ?0-1 drink a day. ?Know how much alcohol is in your drink. In the [place] one drink  equals one 12 oz bottle of beer (355 mL), one 5 oz glass of wine (148 mL), or one 1? oz glass of hard liquor (44 mL). ?Lifestyle ?Do not use any products that contain nicotine or tobacco. These products include cigarettes, chewing tobacco, and vaping devices, such as e-cigarettes. If you need help quitting, ask your health care provider. ?Do not use street drugs. ?Do not share needles. ?Ask your health care provider for help if you need support or information about quitting drugs. ?General instructions ?Schedule regular health, dental, and eye exams. ?Stay current with your vaccines. ?Tell your health care provider if: ?You often feel depressed. ?You have ever been abused or do not feel safe at home. ?Summary ?Adopting a healthy lifestyle and  getting preventive care are important in promoting health and wellness. ?Follow your health care provider's instructions about healthy diet, exercising, and getting tested or screened for diseases. ?Follow your h

## 2021-09-18 NOTE — Progress Notes (Signed)
? ? ?MEDICARE ANNUAL WELLNESS VISIT ? ?09/18/2021 ? ?Telephone Visit Disclaimer ?This Medicare AWV was conducted by telephone due to national recommendations for restrictions regarding the COVID-19 Pandemic (e.g. social distancing).  I verified, using two identifiers, that I am speaking with Lorraine Woods or their authorized healthcare agent. I discussed the limitations, risks, security, and privacy concerns of performing an evaluation and management service by telephone and the potential availability of an in-person appointment in the future. The patient expressed understanding and agreed to proceed.  ?Location of Patient: Home ?Location of Provider (nurse):  In the office. ? ?Subjective:  ? ? ?Lorraine Woods is a 76 y.o. female patient of Metheney, Rene Kocher, MD who had a Medicare Annual Wellness Visit today via telephone. Lorraine Woods is Working part time (one day a week) and lives with their spouse. she has 2 children. she reports that she is socially active and does interact with friends/family regularly. she is minimally physically active and enjoys going antique shopping with her husband. ? ?Patient Care Team: ?Hali Marry, MD as PCP - General (Family Medicine) ?Guss Bunde, MD as Consulting Physician (Obstetrics and Gynecology) ?Darius Bump, [facility] as Pharmacist (Pharmacist) ? ? ?  09/18/2021  ? 10:09 AM 08/03/2020  ? 10:51 AM 06/15/2020  ?  9:21 AM 05/11/2019  ?  9:27 AM 05/05/2018  ?  9:45 AM 06/28/2017  ?  7:13 AM 04/23/2017  ?  8:33 AM  ?Advanced Directives  ?Does Patient Have a Medical Advance Directive? Yes Yes Yes Yes Yes No Yes  ?Type of Advance Directive Living will;Healthcare Power of [place];Living will Living will [place];Living will [place];Living will  [place];Living will  ?Does patient want to make changes to medical advance directive? No - Patient declined No - Patient declined No - Patient  declined No - Patient declined Yes (MAU/Ambulatory/Procedural Areas - Information given)  No - Patient declined  ?Copy of [place] in Chart? Yes - validated most recent copy scanned in chart (See row information) No - copy requested  No - copy requested No - copy requested  No - copy requested  ? ? ?Hospital Utilization Over the Past 12 Months: ?# of hospitalizations or ER visits: 0 ?# of surgeries: 0 ? ?Review of Systems    ?Patient reports that her overall health is unchanged compared to last year. ? ?History obtained from chart review and the patient ? ?Patient Reported Readings (BP, Pulse, CBG, Weight, etc) ?none ? ?Pain Assessment ?Pain : 0-10 ?Pain Score: 7  ?Pain Type: Chronic pain ?Pain Location: Knee ?Pain Orientation: Right ?Pain Descriptors / Indicators: Aching ?Pain Onset: More than a month ago ?Pain Frequency: Other (Comment) (with activity) ?Pain Relieving Factors: rest ?Effect of Pain on Daily Activities: severe pain with activity ? ?Pain Relieving Factors: rest ? ?Current Medications & Allergies (verified) ?Allergies as of 09/18/2021   ? ?   Reactions  ? Aspirin Other (See Comments)  ? Bruising.   ? Excedrin Migraine [aspirin-acetaminophen-caffeine] Other (See Comments)  ? Bruising  ? ?  ? ?  ?Medication List  ?  ? ?  ? Accurate as of September 18, 2021 10:27 AM. If you have any questions, ask your nurse or doctor.  ?  ?  ? ?  ? ?CALTRATE 600+D3 PO ?Take 1 tablet by mouth daily. ?  ?cholecalciferol 25 MCG (1000 UNIT) tablet ?Commonly known as: VITAMIN D3 ?Take 1,000 Units  by mouth daily. ?  ?ipratropium 0.03 % nasal spray ?Commonly known as: ATROVENT ?SPRAY 2 SPRAYS INTO BOTH NOSTRILS EVERY 12 HOURS ?What changed: See the new instructions. ?  ?loratadine 10 MG tablet ?Commonly known as: CLARITIN ?Take 10 mg by mouth as needed for allergies. ?  ?minoxidil 2 % external solution ?Commonly known as: ROGAINE ?Apply topically daily as needed. ?  ?multivitamin with minerals Tabs tablet ?Take  1 tablet by mouth every morning. ?  ?omeprazole 20 MG capsule ?Commonly known as: PRILOSEC ?Take 1 capsule (20 mg total) by mouth daily. ?  ?sertraline 100 MG tablet ?Commonly known as: ZOLOFT ?TAKE 1 TABLET BY MOUTH EVERY DAY IN THE MORNING ?  ?SUMAtriptan 100 MG tablet ?Commonly known as: IMITREX ?TAKE 1 TABLET BY MOUTH EVERY 2HRS AS NEEDED FOR MIGRAINE. MAY REPEAT IN 2HRS IF HEADACHE PERSISTS ?  ? ?  ? ? ?History (reviewed): ?Past Medical History:  ?Diagnosis Date  ? Arthritis   ? Depression   ? GAD (generalized anxiety disorder)   ? History of basal cell carcinoma (BCC) excision   ? nose  x2  last one 2019  ? Hyperlipidemia   ? Knee pain, chronic   ? Migraine   ? Osteopenia   ? PMB (postmenopausal bleeding)   ? Seasonal allergies   ? Thickened endometrium   ? Wears glasses   ? ?Past Surgical History:  ?Procedure Laterality Date  ? APPENDECTOMY  62   age 36  ? BREAST LUMPECTOMY Right 2002  ? on HRT, fibromadenoma  ? CHOLECYSTECTOMY N/A 09/17/2013  ? Procedure: LAPAROSCOPIC CHOLECYSTECTOMY;  Surgeon: Stark Klein, MD;  Location: [facility];  Service: General;  Laterality: N/A;  ? COLONOSCOPY WITH PROPOFOL  last one 06-28-2017  dr Havery Moros  ? DILATATION & CURETTAGE/HYSTEROSCOPY WITH MYOSURE N/A 06/15/2020  ? Procedure: [place];  Surgeon: Armandina Stammer, DO;  Location: [place];  Service: Gynecology;  Laterality: N/A;  ? LAPAROSCOPIC LIVER CYST UNROOFING N/A 09/17/2013  ? Procedure: LAPAROSCOPIC LIVER CYST UNROOFING;  Surgeon: Stark Klein, MD;  Location: [facility];  Service: General;  Laterality: N/A;  ? MOHS SURGERY  x2  last one approx. 2019  ? bcc of nose  ? ORIF ANKLE FRACTURE Left 2000  ? ORIF WRIST FRACTURE Right 10-30-2016  '@[facility]'$   ? sclerotherapy on left leg  2013  ? TUBAL LIGATION  1982  ? ?Family History  ?Problem Relation Age of Onset  ? Brain cancer Mother 40  ? Cancer Mother   ? Diabetes Father   ? Hypertension Father   ? CAD Father   ? Heart  disease Father   ? Colon polyps Son   ?     Puetz-jehgers syndrome  ? Colon cancer Neg Hx   ? Esophageal cancer Neg Hx   ? Rectal cancer Neg Hx   ? Stomach cancer Neg Hx   ? ?Social History  ? ?Socioeconomic History  ? Marital status: Married  ?  Spouse name: Jenny Reichmann   ? Number of children: 2  ? Years of education: 58  ? Highest education level: Associate degree: academic program  ?Occupational History  ? Occupation: Echo sonographer  ?  Employer: [employer]  ?  Comment: retired  ?Tobacco Use  ? Smoking status: Never  ? Smokeless tobacco: Never  ?Vaping Use  ? Vaping Use: Never used  ?Substance and Sexual Activity  ? Alcohol use: Yes  ?  Alcohol/week: 0.0 standard drinks  ?  Comment: 2 wine per month  ? Drug use: Never  ? Sexual activity: Not Currently  ?  Partners: Male  ?  Birth control/protection: Post-menopausal  ?Other Topics Concern  ? Not on file  ?Social History Narrative  ? Lives with her husband. Goes antique shopping with her husband.  ? ?Social Determinants of Health  ? ?Financial Resource Strain: Low Risk   ? Difficulty of Paying Living Expenses: Not hard at all  ?Food Insecurity: No Food Insecurity  ? Worried About Charity fundraiser in the Last Year: Never true  ? Ran Out of Food in the Last Year: Never true  ?Transportation Needs: No Transportation Needs  ? Lack of Transportation (Medical): No  ? Lack of Transportation (Non-Medical): No  ?Physical Activity: Inactive  ? Days of Exercise per Week: 0 days  ? Minutes of Exercise per Session: 0 min  ?Stress: No Stress Concern Present  ? Feeling of Stress : Not at all  ?Social Connections: Moderately Integrated  ? Frequency of Communication with Friends and Family: Twice a week  ? Frequency of Social Gatherings with Friends and Family: Once a week  ? Attends Religious Services: More than 4 times per year  ? Active Member of Clubs or Organizations: No  ? Attends Archivist Meetings: Never  ? Marital Status: Married  ? ? ?Activities  of Daily Living ? ?  09/18/2021  ? 10:18 AM  ?In your present state of health, do you have any difficulty performing the following activities:  ?Hearing? 1  ?Comment some hearing loss.  ?Vision? 0  ?Difficulty conc

## 2021-09-19 ENCOUNTER — Encounter: Payer: Self-pay | Admitting: Family Medicine

## 2021-09-19 ENCOUNTER — Ambulatory Visit (INDEPENDENT_AMBULATORY_CARE_PROVIDER_SITE_OTHER): Payer: Medicare Other | Admitting: Family Medicine

## 2021-09-19 VITALS — BP 116/64 | HR 70 | Resp 16 | Ht 70.0 in | Wt 192.0 lb

## 2021-09-19 DIAGNOSIS — Z01818 Encounter for other preprocedural examination: Secondary | ICD-10-CM | POA: Diagnosis not present

## 2021-09-19 DIAGNOSIS — R7309 Other abnormal glucose: Secondary | ICD-10-CM

## 2021-09-19 DIAGNOSIS — D649 Anemia, unspecified: Secondary | ICD-10-CM | POA: Diagnosis not present

## 2021-09-19 DIAGNOSIS — M1710 Unilateral primary osteoarthritis, unspecified knee: Secondary | ICD-10-CM | POA: Diagnosis not present

## 2021-09-19 LAB — POCT URINALYSIS DIP (CLINITEK)
Bilirubin, UA: NEGATIVE
Blood, UA: NEGATIVE
Glucose, UA: NEGATIVE mg/dL
Ketones, POC UA: NEGATIVE mg/dL
Nitrite, UA: NEGATIVE
POC PROTEIN,UA: NEGATIVE
Spec Grav, UA: 1.025 (ref 1.010–1.025)
Urobilinogen, UA: 0.2 E.U./dL
pH, UA: 7 (ref 5.0–8.0)

## 2021-09-19 NOTE — Progress Notes (Signed)
? ?Established Patient Office Visit ? ?Subjective   ?Patient ID: Lorraine Woods, female    DOB: [birth year]  Age: 76 y.o. MRN: [id number] ? ?Chief Complaint  ?Patient presents with  ? Preop Clearance  ?  Patient here for pre op clearance to have surgery on right knee replacement on 10/16/21.   ? ? ?HPI ? ?She is here today for preoperative clearance for surgery for right knee replacement scheduled on May 15.  Dr. Laqueta Jean will be performing her surgery.  She does use Aleve as needed for pain relief.  She does take a proton pump inhibitor every other day.  She has never had any problems or complications with surgery or anesthesia.  No difficulty swallowing.  No recent chest pain or shortness of breath.  She feels like she could walk 2 blocks without difficulty.  Actually had surgery last year and did well.  No history of diabetes or lung disease or cardiac disease. ? ?Does not take any fish oil or again seen. ? ?Patient Active Problem List  ? Diagnosis Date Noted  ? GERD (gastroesophageal reflux disease) 05/04/2021  ? Tonsil stone 10/26/2019  ? Hearing loss of aging 10/26/2019  ? Hyperlipidemia 04/28/2019  ? Chondromalacia of left patellofemoral joint 08/25/2015  ? Osteopenia 06/30/2014  ? Solitary pulmonary nodule 08/04/2013  ? Social anxiety disorder 03/03/2013  ? Migraine without aura, not refractory 03/03/2013  ? Vitamin D deficiency 03/03/2013  ? Alopecia of scalp 03/03/2013  ? History of basal cell carcinoma of skin 03/03/2013  ? Liver cyst 03/03/2013  ? Varicose veins 02/26/2011  ? Tinnitus 02/24/2011  ? Generalized anxiety disorder 02/24/2011  ? Benign neoplasm of breast 02/24/2011  ? ?Past Medical History:  ?Diagnosis Date  ? Arthritis   ? Depression   ? GAD (generalized anxiety disorder)   ? History of basal cell carcinoma (BCC) excision   ? nose  x2  last one 2019  ? Hyperlipidemia   ? Knee pain, chronic   ? Migraine   ? Osteopenia   ? PMB (postmenopausal bleeding)   ? Seasonal allergies   ? Thickened  endometrium   ? Wears glasses   ? ?Past Surgical History:  ?Procedure Laterality Date  ? APPENDECTOMY  69   age 76  ? BREAST LUMPECTOMY Right 2002  ? on HRT, fibromadenoma  ? CHOLECYSTECTOMY N/A 09/17/2013  ? Procedure: LAPAROSCOPIC CHOLECYSTECTOMY;  Surgeon: Stark Klein, MD;  Location: [facility];  Service: General;  Laterality: N/A;  ? COLONOSCOPY WITH PROPOFOL  last one 06-28-2017  dr Havery Moros  ? DILATATION & CURETTAGE/HYSTEROSCOPY WITH MYOSURE N/A 06/15/2020  ? Procedure: [place];  Surgeon: Armandina Stammer, DO;  Location: [place];  Service: Gynecology;  Laterality: N/A;  ? LAPAROSCOPIC LIVER CYST UNROOFING N/A 09/17/2013  ? Procedure: LAPAROSCOPIC LIVER CYST UNROOFING;  Surgeon: Stark Klein, MD;  Location[facility];  Service: General;  Laterality: N/A;  ? MOHS SURGERY  x2  last one approx. 2019  ? bcc of nose  ? ORIF ANKLE FRACTURE Left 2000  ? ORIF WRIST FRACTURE Right 10-30-2016  _0   ? sclerotherapy on left leg  2013  ? TUBAL LIGATION  1982  ? ?Social History  ? ?Tobacco Use  ? Smoking status: Never  ? Smokeless tobacco: Never  ?Vaping Use  ? Vaping Use: Never used  ?Substance Use Topics  ? Alcohol use: Yes  ?  Alcohol/week: 0.0 standard drinks  ?  Comment: 2 wine per month  ?  Drug use: Never  ? ?Allergies  ?Allergen Reactions  ? Aspirin Other (See Comments)  ?  Bruising.   ? Excedrin Migraine [Aspirin-Acetaminophen-Caffeine] Other (See Comments)  ?  Bruising ?  ? ?  ? ?ROS ?Negative review of systems except for HPI. ?  ?Objective:  ?  ? ?BP 116/64   Pulse 70   Resp 16   Ht 5' 10" (1.778 m)   Wt 192 lb (87.1 kg)   BMI 27.55 kg/m?  ? ? ?Physical Exam ?Vitals and nursing note reviewed.  ?Constitutional:   ?   Appearance: She is well-developed.  ?HENT:  ?   Head: Normocephalic and atraumatic.  ?   Right Ear: External ear normal.  ?   Left Ear: External ear normal.  ?   Nose: Nose normal.  ?Eyes:  ?   Conjunctiva/sclera: Conjunctivae normal.  ?    Pupils: Pupils are equal, round, and reactive to light.  ?Neck:  ?   Thyroid: No thyromegaly.  ?Cardiovascular:  ?   Rate and Rhythm: Normal rate and regular rhythm.  ?   Heart sounds: Normal heart sounds.  ?Pulmonary:  ?   Effort: Pulmonary effort is normal.  ?   Breath sounds: Normal breath sounds. No wheezing.  ?Abdominal:  ?   General: Abdomen is flat. Bowel sounds are normal.  ?   Tenderness: There is no abdominal tenderness.  ?Musculoskeletal:  ?   Cervical back: Neck supple.  ?Lymphadenopathy:  ?   Cervical: No cervical adenopathy.  ?Skin: ?   General: Skin is warm and dry.  ?Neurological:  ?   General: No focal deficit present.  ?   Mental Status: She is alert and oriented to person, place, and time.  ?Psychiatric:     ?   Mood and Affect: Mood normal.     ?   Behavior: Behavior normal.  ? ? ? ?Results for orders placed or performed in visit on 09/19/21  ?CBC  ?Result Value Ref Range  ? WBC 4.5 3.8 - 10.8 Thousand/uL  ? RBC 4.80 3.80 - 5.10 Million/uL  ? Hemoglobin 11.2 (L) 11.7 - 15.5 g/dL  ? HCT 36.9 35.0 - 45.0 %  ? MCV 76.9 (L) 80.0 - 100.0 fL  ? MCH 23.3 (L) 27.0 - 33.0 pg  ? MCHC 30.4 (L) 32.0 - 36.0 g/dL  ? RDW 15.6 (H) 11.0 - 15.0 %  ? Platelets 256 140 - 400 Thousand/uL  ? MPV 11.2 7.5 - 12.5 fL  ?COMPLETE METABOLIC PANEL WITH GFR  ?Result Value Ref Range  ? Glucose, Bld 94 65 - 139 mg/dL  ? BUN 22 7 - 25 mg/dL  ? Creat 0.74 0.60 - 1.00 mg/dL  ? eGFR 84 > OR = 60 mL/min/1.106m  ? BUN/Creatinine Ratio NOT APPLICABLE 6 - 22 (calc)  ? Sodium 142 135 - 146 mmol/L  ? Potassium 4.6 3.5 - 5.3 mmol/L  ? Chloride 105 98 - 110 mmol/L  ? CO2 29 20 - 32 mmol/L  ? Calcium 9.4 8.6 - 10.4 mg/dL  ? Total Protein 6.8 6.1 - 8.1 g/dL  ? Albumin 4.3 3.6 - 5.1 g/dL  ? Globulin 2.5 1.9 - 3.7 g/dL (calc)  ? AG Ratio 1.7 1.0 - 2.5 (calc)  ? Total Bilirubin 0.5 0.2 - 1.2 mg/dL  ? Alkaline phosphatase (APISO) 43 37 - 153 U/L  ? AST 19 10 - 35 U/L  ? ALT 14 6 - 29 U/L  ?HgB A1c  ?Result Value Ref Range  ?  Hgb A1c MFr Bld 5.5  <5.7 % of total Hgb  ? Mean Plasma Glucose 111 mg/dL  ? eAG (mmol/L) 6.2 mmol/L  ?INR/PT  ?Result Value Ref Range  ? INR 1.1   ? Prothrombin Time 10.7 9.0 - 11.5 sec  ?POCT URINALYSIS DIP (CLINITEK)  ?Result Value Ref Range  ? Color, UA yellow yellow  ? Clarity, UA cloudy (A) clear  ? Glucose, UA negative negative mg/dL  ? Bilirubin, UA negative negative  ? Ketones, POC UA negative negative mg/dL  ? Spec Grav, UA 1.025 1.010 - 1.025  ? Blood, UA negative negative  ? pH, UA 7.0 5.0 - 8.0  ? POC PROTEIN,UA negative negative, trace  ? Urobilinogen, UA 0.2 0.2 or 1.0 E.U./dL  ? Nitrite, UA Negative Negative  ? Leukocytes, UA Trace (A) Negative  ? ? ? ? ?The 10-year ASCVD risk score (Arnett DK, et al., 2019) is: 13.5% ? ?  ?Assessment & Plan:  ? ?Problem List Items Addressed This Visit   ?None ?Visit Diagnoses   ? ? Preop examination    -  Primary  ? Relevant Orders  ? CBC (Completed)  ? EKG 12-Lead  ? Basic Metabolic Panel (BMET)  ? COMPLETE METABOLIC PANEL WITH GFR (Completed)  ? HgB A1c (Completed)  ? INR/PT (Completed)  ? POCT URINALYSIS DIP (CLINITEK) (Completed)  ? Abnormal glucose      ? Relevant Orders  ? HgB A1c (Completed)  ? Primary osteoarthritis of knee, unspecified laterality      ? Relevant Orders  ? INR/PT (Completed)  ? ?  ? ?Preoperative clearance-she is cleared for knee replacement surgery.  She is overall low risk.  Hold all NSAIDs 1 week prior.  Labs just show some borderline mild anemia with hemoglobin 11.1 but this should not be a contraindication to her surgery. ? ?EKG today shows rate of 64 bpm, normal sinus rhythm with no acute ST-T wave changes.  Unchanged from previous.  See attached labs. ? ?No follow-ups on file.  ? ? ?[name], MD ? ?

## 2021-09-20 ENCOUNTER — Encounter: Payer: Self-pay | Admitting: Family Medicine

## 2021-09-20 NOTE — Progress Notes (Signed)
Hi Lorraine Woods, your hemoglobin dropped a little bit compared to 4 months ago it was 13 and it is now 11.2.  Are you noticing any blood in the urine or stool?  Plus the size of your red blood cells is a little bit smaller some get a check for iron deficiency.  Your A1c looks great at 5.5 no sign of diabetes or prediabetes.  ET INR is also normal.  Your metabolic panel looks great. ? ?Call the lab and see if we can add an iron panel since she has microcytic anemia.  Says she is due for colonoscopy next year.

## 2021-09-23 LAB — COMPLETE METABOLIC PANEL WITHOUT GFR
AG Ratio: 1.7 (calc) (ref 1.0–2.5)
ALT: 14 U/L (ref 6–29)
AST: 19 U/L (ref 10–35)
Albumin: 4.3 g/dL (ref 3.6–5.1)
Alkaline phosphatase (APISO): 43 U/L (ref 37–153)
BUN: 22 mg/dL (ref 7–25)
CO2: 29 mmol/L (ref 20–32)
Calcium: 9.4 mg/dL (ref 8.6–10.4)
Chloride: 105 mmol/L (ref 98–110)
Creat: 0.74 mg/dL (ref 0.60–1.00)
Globulin: 2.5 g/dL (ref 1.9–3.7)
Glucose, Bld: 94 mg/dL (ref 65–139)
Potassium: 4.6 mmol/L (ref 3.5–5.3)
Sodium: 142 mmol/L (ref 135–146)
Total Bilirubin: 0.5 mg/dL (ref 0.2–1.2)
Total Protein: 6.8 g/dL (ref 6.1–8.1)
eGFR: 84 mL/min/1.73m2

## 2021-09-23 LAB — CBC
HCT: 36.9 % (ref 35.0–45.0)
Hemoglobin: 11.2 g/dL — ABNORMAL LOW (ref 11.7–15.5)
MCH: 23.3 pg — ABNORMAL LOW (ref 27.0–33.0)
MCHC: 30.4 g/dL — ABNORMAL LOW (ref 32.0–36.0)
MCV: 76.9 fL — ABNORMAL LOW (ref 80.0–100.0)
MPV: 11.2 fL (ref 7.5–12.5)
Platelets: 256 10*3/uL (ref 140–400)
RBC: 4.8 10*6/uL (ref 3.80–5.10)
RDW: 15.6 % — ABNORMAL HIGH (ref 11.0–15.0)
WBC: 4.5 10*3/uL (ref 3.8–10.8)

## 2021-09-23 LAB — HEMOGLOBIN A1C
Hgb A1c MFr Bld: 5.5 %{Hb}
Mean Plasma Glucose: 111 mg/dL
eAG (mmol/L): 6.2 mmol/L

## 2021-09-23 LAB — PROTIME-INR
INR: 1.1
Prothrombin Time: 10.7 s (ref 9.0–11.5)

## 2021-09-23 LAB — IRON: Iron: 48 ug/dL (ref 45–160)

## 2021-09-25 ENCOUNTER — Telehealth: Payer: Self-pay | Admitting: *Deleted

## 2021-09-25 NOTE — Telephone Encounter (Signed)
Pt called wanting to know the results of her recent iron testing that Dr. Madilyn Fireman ordered. She stated that she has an appt with the surgical center tomorrow and wanted to have this information for them. ? ?Pt was advised that this testing was NOT a part of her surgical clearance and that she should be ok to proceed with her surgery. However, I would fwd this to Dr. Madilyn Fireman for her and we would make every attempt to get this information back to her before her appt tomorrow. She voiced understanding and agreed.  ? ?

## 2021-09-26 DIAGNOSIS — M1711 Unilateral primary osteoarthritis, right knee: Secondary | ICD-10-CM | POA: Diagnosis not present

## 2021-09-26 DIAGNOSIS — M25661 Stiffness of right knee, not elsewhere classified: Secondary | ICD-10-CM | POA: Diagnosis not present

## 2021-09-26 DIAGNOSIS — M25561 Pain in right knee: Secondary | ICD-10-CM | POA: Diagnosis not present

## 2021-09-26 NOTE — H&P (Signed)
TOTAL KNEE ADMISSION H&P ? ?Patient is being admitted for right total knee arthroplasty. ? ?Subjective: ? ?Chief Complaint: Right knee pain. ? ?HPI: Lorraine Woods, 76 y.o. female has a history of pain and functional disability in the right knee due to arthritis and has failed non-surgical conservative treatments for greater than 12 weeks to include NSAID's and/or analgesics and activity modification. Onset of symptoms was gradual, starting  several  years ago with gradually worsening course since that time. The patient noted no past surgery on the right knee.  Patient currently rates pain in the right knee at 8 out of 10 with activity. Patient has night pain, worsening of pain with activity and weight bearing, pain with passive range of motion, and crepitus. Patient has evidence of  bone-on-bone arthritis in the lateral and patellofemoral compartments, especially on the lateral view  by imaging studies. There is no active infection. ? ?Patient Active Problem List  ? Diagnosis Date Noted  ? GERD (gastroesophageal reflux disease) 05/04/2021  ? Tonsil stone 10/26/2019  ? Hearing loss of aging 10/26/2019  ? Hyperlipidemia 04/28/2019  ? Chondromalacia of left patellofemoral joint 08/25/2015  ? Osteopenia 06/30/2014  ? Solitary pulmonary nodule 08/04/2013  ? Social anxiety disorder 03/03/2013  ? Migraine without aura, not refractory 03/03/2013  ? Vitamin D deficiency 03/03/2013  ? Alopecia of scalp 03/03/2013  ? History of basal cell carcinoma of skin 03/03/2013  ? Liver cyst 03/03/2013  ? Varicose veins 02/26/2011  ? Tinnitus 02/24/2011  ? Generalized anxiety disorder 02/24/2011  ? Benign neoplasm of breast 02/24/2011  ? ? ?Past Medical History:  ?Diagnosis Date  ? Arthritis   ? Depression   ? GAD (generalized anxiety disorder)   ? History of basal cell carcinoma (BCC) excision   ? nose  x2  last one 2019  ? Hyperlipidemia   ? Knee pain, chronic   ? Migraine   ? Osteopenia   ? PMB (postmenopausal bleeding)   ? Seasonal  allergies   ? Thickened endometrium   ? Wears glasses   ? ? ?Past Surgical History:  ?Procedure Laterality Date  ? APPENDECTOMY  23   age 6  ? BREAST LUMPECTOMY Right 2002  ? on HRT, fibromadenoma  ? CHOLECYSTECTOMY N/A 09/17/2013  ? Procedure: LAPAROSCOPIC CHOLECYSTECTOMY;  Surgeon: Stark Klein, MD;  Location: [facility];  Service: General;  Laterality: N/A;  ? COLONOSCOPY WITH PROPOFOL  last one 06-28-2017  dr Havery Moros  ? DILATATION & CURETTAGE/HYSTEROSCOPY WITH MYOSURE N/A 06/15/2020  ? Procedure: [place];  Surgeon: Armandina Stammer, DO;  Location: [place];  Service: Gynecology;  Laterality: N/A;  ? LAPAROSCOPIC LIVER CYST UNROOFING N/A 09/17/2013  ? Procedure: LAPAROSCOPIC LIVER CYST UNROOFING;  Surgeon: Stark Klein, MD;  Location: [facility];  Service: General;  Laterality: N/A;  ? MOHS SURGERY  x2  last one approx. 2019  ? bcc of nose  ? ORIF ANKLE FRACTURE Left 2000  ? ORIF WRIST FRACTURE Right 10-30-2016  '@[facility]'$   ? sclerotherapy on left leg  2013  ? TUBAL LIGATION  1982  ? ? ?Prior to Admission medications   ?Medication Sig Start Date End Date Taking? Authorizing Provider  ?Calcium Carb-Cholecalciferol (CALTRATE 600+D3 PO) Take 1 tablet by mouth daily.    [provider]  ?cholecalciferol (VITAMIN D3) 25 MCG (1000 UNIT) tablet Take 1,000 Units by mouth daily.    [provider]  ?ipratropium (ATROVENT) 0.03 % nasal spray SPRAY 2 SPRAYS INTO BOTH NOSTRILS  EVERY 12 HOURS ?Patient taking differently: Place 1 spray into both nostrils daily. 11/27/19   Hali Marry, MD  ?minoxidil (ROGAINE) 2 % external solution Apply topically daily as needed.    [provider]  ?Multiple Vitamin (MULTIVITAMIN WITH MINERALS) TABS tablet Take 1 tablet by mouth every morning.    [provider]  ?omeprazole (PRILOSEC) 20 MG capsule Take 1 capsule (20 mg total) by mouth daily. 05/04/21   Hali Marry, MD  ?sertraline  (ZOLOFT) 100 MG tablet TAKE 1 TABLET BY MOUTH EVERY DAY IN THE MORNING 02/28/21   Hali Marry, MD  ?SUMAtriptan (IMITREX) 100 MG tablet TAKE 1 TABLET BY MOUTH EVERY 2HRS AS NEEDED FOR MIGRAINE. MAY REPEAT IN 2HRS IF HEADACHE PERSISTS 05/04/21   Hali Marry, MD  ? ? ?Allergies  ?Allergen Reactions  ? Aspirin Other (See Comments)  ?  Bruising.   ? Excedrin Migraine [Aspirin-Acetaminophen-Caffeine] Other (See Comments)  ?  Bruising ?  ? ? ?Social History  ? ?Socioeconomic History  ? Marital status: Married  ?  Spouse name: Jenny Reichmann   ? Number of children: 2  ? Years of education: 67  ? Highest education level: Associate degree: academic program  ?Occupational History  ? Occupation: Echo sonographer  ?  Employer: [employer]  ?  Comment: retired  ?Tobacco Use  ? Smoking status: Never  ? Smokeless tobacco: Never  ?Vaping Use  ? Vaping Use: Never used  ?Substance and Sexual Activity  ? Alcohol use: Yes  ?  Alcohol/week: 0.0 standard drinks  ?  Comment: 2 wine per month  ? Drug use: Never  ? Sexual activity: Not Currently  ?  Partners: Male  ?  Birth control/protection: Post-menopausal  ?Other Topics Concern  ? Not on file  ?Social History Narrative  ? Lives with her husband. Goes antique shopping with her husband.  ? ?Social Determinants of Health  ? ?Financial Resource Strain: Low Risk   ? Difficulty of Paying Living Expenses: Not hard at all  ?Food Insecurity: No Food Insecurity  ? Worried About Charity fundraiser in the Last Year: Never true  ? Ran Out of Food in the Last Year: Never true  ?Transportation Needs: No Transportation Needs  ? Lack of Transportation (Medical): No  ? Lack of Transportation (Non-Medical): No  ?Physical Activity: Inactive  ? Days of Exercise per Week: 0 days  ? Minutes of Exercise per Session: 0 min  ?Stress: No Stress Concern Present  ? Feeling of Stress : Not at all  ?Social Connections: Moderately Integrated  ? Frequency of Communication with Friends and  Family: Twice a week  ? Frequency of Social Gatherings with Friends and Family: Once a week  ? Attends Religious Services: More than 4 times per year  ? Active Member of Clubs or Organizations: No  ? Attends Archivist Meetings: Never  ? Marital Status: Married  ?Intimate Partner Violence: Not At Risk  ? Fear of Current or Ex-Partner: No  ? Emotionally Abused: No  ? Physically Abused: No  ? Sexually Abused: No  ? ? ?Tobacco Use: Low Risk   ? Smoking Tobacco Use: Never  ? Smokeless Tobacco Use: Never  ? Passive Exposure: Not on file  ? ?Social History  ? ?Substance and Sexual Activity  ?Alcohol Use Yes  ? Alcohol/week: 0.0 standard drinks  ? Comment: 2 wine per month  ? ? ?Family History  ?Problem Relation Age of Onset  ? Brain  cancer Mother 36  ? Cancer Mother   ? Diabetes Father   ? Hypertension Father   ? CAD Father   ? Heart disease Father   ? Colon polyps Son   ?     Puetz-jehgers syndrome  ? Colon cancer Neg Hx   ? Esophageal cancer Neg Hx   ? Rectal cancer Neg Hx   ? Stomach cancer Neg Hx   ? ? ?Review of Systems  ?Constitutional:  Negative for chills and fever.  ?HENT:  Negative for congestion, sore throat and tinnitus.   ?Eyes:  Negative for double vision, photophobia and pain.  ?Respiratory:  Negative for cough, shortness of breath and wheezing.   ?Cardiovascular:  Negative for chest pain, palpitations and orthopnea.  ?Gastrointestinal:  Negative for heartburn, nausea and vomiting.  ?Genitourinary:  Negative for dysuria, frequency and urgency.  ?Musculoskeletal:  Positive for joint pain.  ?Neurological:  Negative for dizziness, weakness and headaches.  ? ?Objective: ? ?Physical Exam: ?Well nourished and well developed.  ?General: Alert and oriented x3, cooperative and pleasant, no acute distress.  ?Head: normocephalic, atraumatic, neck supple.  ?Eyes: EOMI.  ?Musculoskeletal: ? ?Right Knee Exam:  ? Valgus deformity.  ? No effusion.  ? Range of motion is 5 to 125 degrees.  ? Moderate crepitus on  range of motion of the knee.  ? Lateral joint line tenderness.  ? Stable knee.  ? ?Calves soft and nontender. Motor function intact in LE. Strength 5/5 LE bilaterally. ?Neuro: Distal pulses 2+. Sensation to light tou

## 2021-09-26 NOTE — Telephone Encounter (Signed)
Iron was technically in the normal range but on the very very low end of normal.  So I would recommend taking an extra iron supplement.  We can always do a full iron panel maybe a month or so after her surgery. ?

## 2021-09-26 NOTE — Progress Notes (Signed)
Lorraine Woods, we did have the lab add on an iron level and it was on the low end of normal but technically still normal.  But I would encourage you to start taking some over-the-counter iron and eating an iron rich diet.  We can always recheck it again in about a month after your surgery with an iron panel which will also include a ferritin.

## 2021-09-26 NOTE — Telephone Encounter (Signed)
Task completed. Patient has been updated of provider's recommendation. Agreeable with plan of care. She will keep provider updated as needed. No other inquiries during the call.  ?

## 2021-10-10 NOTE — Progress Notes (Signed)
DUE TO COVID-19 ONLY  2   VISITOR IS ALLOWED TO COME WITH YOU AND STAY IN THE WAITING ROOM ONLY DURING PRE OP AND PROCEDURE DAY OF SURGERY.   4  VISITOR  MAY VISIT WITH YOU AFTER SURGERY IN YOUR PRIVATE ROOM DURING VISITING HOURS ONLY! ?YOU MAY HAVE ONE PERSON SPEND THE NITE WITH YOU IN YOUR ROOM AFTER SURGERY.   ? ? ? ? Your procedur   e is scheduled on:  ?                   10/23/21  ? ? Report to [facility] Main  Entrance ? ? Report to admitting at       1000           AM ?DO NOT BRING INSURANCE CARD, PICTURE ID OR WALLET DAY OF SURGERY.  ?  ? ? Call this number if you have problems the morning of surgery [phone]  ? ? REMEMBER: NO  SOLID FOODS , CANDY, GUM OR MINTS AFTER MIDNITE THE NITE BEFORE SURGERY .       Marland Kitchen CLEAR LIQUIDS UNTIL        0930am         DAY OF SURGERY.      PLEASE FINISH ENSURE DRINK PER SURGEON ORDER  WHICH NEEDS TO BE COMPLETED AT      0930am      MORNING OF SURGERY.   ? ? ? ? ?CLEAR LIQUID DIET ? ? ?Foods Allowed      ?WATER ?BLACK COFFEE ( SUGAR OK, NO MILK, CREAM OR CREAMER) REGULAR AND DECAF  ?TEA ( SUGAR OK NO MILK, CREAM, OR CREAMER) REGULAR AND DECAF  ?PLAIN JELLO ( NO RED)  ?FRUIT ICES ( NO RED, NO FRUIT PULP)  ?POPSICLES ( NO RED)  ?JUICE- APPLE, WHITE GRAPE AND WHITE CRANBERRY  ?SPORT DRINK LIKE GATORADE ( NO RED)  ?CLEAR BROTH ( VEGETABLE , CHICKEN OR BEEF)                                                               ? ?    ? ?BRUSH YOUR TEETH MORNING OF SURGERY AND RINSE YOUR MOUTH OUT, NO CHEWING GUM CANDY OR MINTS. ?  ? ? Take these medicines the morning of surgery with A SIP OF WATER:  omeprazole, zoloft  ? ? ?DO NOT TAKE ANY DIABETIC MEDICATIONS DAY OF YOUR SURGERY ?                  ?            You may not have any metal on your body including hair pins and  ?            piercings  Do not wear jewelry, make-up, lotions, powders or perfumes, deodorant ?            Do not wear nail polish on your fingernails.   ?           IF YOU ARE A FEMALE AND WANT TO SHAVE  UNDER ARMS OR LEGS PRIOR TO SURGERY YOU MUST DO SO AT LEAST 48 HOURS PRIOR TO SURGERY.  ?            Men may shave face and  neck. ? ? Do not bring valuables to the hospital. [facility] NOT ?            RESPONSIBLE   FOR VALUABLES. ? Contacts, dentures or bridgework may not be worn into surgery. ? Leave suitcase in the car. After surgery it may be brought to your room. ? ?  ? Patients discharged the day of surgery will not be allowed to drive home. IF YOU ARE HAVING SURGERY AND GOING HOME THE SAME DAY, YOU MUST HAVE AN ADULT TO DRIVE YOU HOME AND BE WITH YOU FOR 24 HOURS. YOU MAY GO HOME BY TAXI OR UBER OR ORTHERWISE, BUT AN ADULT MUST ACCOMPANY YOU HOME AND STAY WITH YOU FOR 24 HOURS. ?  ? ?            Please read over the following fact sheets you were given: ?_____________________________________________________________________ ? ?[facility] - Preparing for Surgery ?Before surgery, you can play an important role.  Because skin is not sterile, your skin needs to be as free of germs as possible.  You can reduce the number of germs on your skin by washing with CHG (chlorahexidine gluconate) soap before surgery.  CHG is an antiseptic cleaner which kills germs and bonds with the skin to continue killing germs even after washing. ?Please DO NOT use if you have an allergy to CHG or antibacterial soaps.  If your skin becomes reddened/irritated stop using the CHG and inform your nurse when you arrive at Short Stay. ?Do not shave (including legs and underarms) for at least 48 hours prior to the first CHG shower.  You may shave your face/neck. ?Please follow these instructions carefully: ? 1.  Shower with CHG Soap the night before surgery and the  morning of Surgery. ? 2.  If you choose to wash your hair, wash your hair first as usual with your  normal  shampoo. ? 3.  After you shampoo, rinse your hair and body thoroughly to remove the  shampoo.                           4.  Use CHG as you would any other liquid soap.   You can apply chg directly  to the skin and wash  ?                     Gently with a scrungie or clean washcloth. ? 5.  Apply the CHG Soap to your body ONLY FROM THE NECK DOWN.   Do not use on face/ open      ?                     Wound or open sores. Avoid contact with eyes, ears mouth and genitals (private parts).  ?                     Production manager,  Genitals (private parts) with your normal soap. ?            6.  Wash thoroughly, paying special attention to the area where your surgery  will be performed. ? 7.  Thoroughly rinse your body with warm water from the neck down. ? 8.  DO NOT shower/wash with your normal soap after using and rinsing off  the CHG Soap. ?               9.  Pat yourself dry with  a clean towel. ?           10.  Wear clean pajamas. ?           11.  Place clean sheets on your bed the night of your first shower and do not  sleep with pets. ?Day of Surgery : ?Do not apply any lotions/deodorants the morning of surgery.  Please wear clean clothes to the hospital/surgery center. ? ?FAILURE TO FOLLOW THESE INSTRUCTIONS MAY RESULT IN THE CANCELLATION OF YOUR SURGERY ?PATIENT SIGNATURE_________________________________ ? ?NURSE SIGNATURE__________________________________ ? ?________________________________________________________________________  ? ? ?           ?

## 2021-10-10 NOTE — Progress Notes (Addendum)
Anesthesia Review: ? ?PCP: Lorraine Woods- preop exam on 09/19/21  on chart  ?Cardiologist : NONE  ?Chest x-ray : ?EKG : 09/19/21  on chart  ?Echo : ?Stress test: ?Cardiac Cath :  ?Activity level:  can do a flight of stairs without difficulty  ?Sleep Study/ CPAP : none  ?Fasting Blood Sugar :      / Checks Blood Sugar -- times a day:   ?Blood Thinner/ Instructions /Last Dose: ?ASA / Instructions/ Last Dose :   ?Hgba1c- 09/19/21- 5.5  ?

## 2021-10-12 ENCOUNTER — Encounter (HOSPITAL_COMMUNITY): Payer: Self-pay

## 2021-10-12 ENCOUNTER — Encounter (HOSPITAL_COMMUNITY)
Admission: RE | Admit: 2021-10-12 | Discharge: 2021-10-12 | Disposition: A | Discharge status: Home / Self Care | Payer: Medicare Other | Source: Ambulatory Visit | Attending: Orthopedic Surgery | Admitting: Orthopedic Surgery

## 2021-10-12 ENCOUNTER — Other Ambulatory Visit: Payer: Self-pay

## 2021-10-12 VITALS — BP 122/66 | HR 70 | Temp 98.2°F | Resp 16 | Ht 70.0 in | Wt 187.0 lb

## 2021-10-12 DIAGNOSIS — M1711 Unilateral primary osteoarthritis, right knee: Secondary | ICD-10-CM | POA: Diagnosis not present

## 2021-10-12 DIAGNOSIS — Z01812 Encounter for preprocedural laboratory examination: Secondary | ICD-10-CM | POA: Insufficient documentation

## 2021-10-12 DIAGNOSIS — Z01818 Encounter for other preprocedural examination: Secondary | ICD-10-CM

## 2021-10-12 HISTORY — DX: Gastro-esophageal reflux disease without esophagitis: K21.9

## 2021-10-12 LAB — COMPREHENSIVE METABOLIC PANEL
ALT: 16 U/L (ref 0–44)
AST: 19 U/L (ref 15–41)
Albumin: 4.1 g/dL (ref 3.5–5.0)
Alkaline Phosphatase: 47 U/L (ref 38–126)
Anion gap: 9 (ref 5–15)
BUN: 22 mg/dL (ref 8–23)
CO2: 26 mmol/L (ref 22–32)
Calcium: 9.1 mg/dL (ref 8.9–10.3)
Chloride: 106 mmol/L (ref 98–111)
Creatinine, Ser: 0.66 mg/dL (ref 0.44–1.00)
GFR, Estimated: >60 mL/min (ref >60)
Glucose, Bld: 109 mg/dL — ABNORMAL HIGH (ref 70–99)
Potassium: 3.5 mmol/L (ref 3.5–5.1)
Sodium: 141 mmol/L (ref 135–145)
Total Bilirubin: 0.6 mg/dL (ref 0.3–1.2)
Total Protein: 7.2 g/dL (ref 6.5–8.1)

## 2021-10-12 LAB — CBC
HCT: 39.8 % (ref 36.0–46.0)
Hemoglobin: 12.5 g/dL (ref 12.0–15.0)
MCH: 24.6 pg — ABNORMAL LOW (ref 26.0–34.0)
MCHC: 31.4 g/dL (ref 30.0–36.0)
MCV: 78.2 fL — ABNORMAL LOW (ref 80.0–100.0)
Platelets: 277 10*3/uL (ref 150–400)
RBC: 5.09 MIL/uL (ref 3.87–5.11)
RDW: 19.4 % — ABNORMAL HIGH (ref 11.5–15.5)
WBC: 5.3 10*3/uL (ref 4.0–10.5)
nRBC: 0 % (ref 0.0–0.2)

## 2021-10-12 LAB — SURGICAL PCR SCREEN
MRSA, PCR: NEGATIVE
Staphylococcus aureus: NEGATIVE

## 2021-10-13 ENCOUNTER — Encounter: Payer: Self-pay | Admitting: Family Medicine

## 2021-10-15 ENCOUNTER — Other Ambulatory Visit: Payer: Self-pay | Admitting: Family Medicine

## 2021-10-15 DIAGNOSIS — N760 Acute vaginitis: Secondary | ICD-10-CM

## 2021-10-15 DIAGNOSIS — F411 Generalized anxiety disorder: Secondary | ICD-10-CM

## 2021-10-23 ENCOUNTER — Ambulatory Visit (HOSPITAL_BASED_OUTPATIENT_CLINIC_OR_DEPARTMENT_OTHER): Payer: Medicare Other | Admitting: Anesthesiology

## 2021-10-23 ENCOUNTER — Encounter (HOSPITAL_COMMUNITY): Payer: Self-pay | Admitting: Orthopedic Surgery

## 2021-10-23 ENCOUNTER — Other Ambulatory Visit: Payer: Self-pay

## 2021-10-23 ENCOUNTER — Observation Stay (HOSPITAL_COMMUNITY)
Admission: RE | Admit: 2021-10-23 | Discharge: 2021-10-24 | Disposition: A | Discharge status: Home / Self Care | Payer: Medicare Other | Source: Ambulatory Visit | Attending: Orthopedic Surgery | Admitting: Orthopedic Surgery

## 2021-10-23 ENCOUNTER — Encounter (HOSPITAL_COMMUNITY)
Admission: RE | Disposition: A | Discharge status: Home / Self Care | Payer: Self-pay | Source: Ambulatory Visit | Attending: Orthopedic Surgery

## 2021-10-23 ENCOUNTER — Ambulatory Visit (HOSPITAL_COMMUNITY): Payer: Medicare Other | Admitting: Physician Assistant

## 2021-10-23 DIAGNOSIS — G8918 Other acute postprocedural pain: Secondary | ICD-10-CM | POA: Diagnosis not present

## 2021-10-23 DIAGNOSIS — M1711 Unilateral primary osteoarthritis, right knee: Secondary | ICD-10-CM

## 2021-10-23 DIAGNOSIS — Z85828 Personal history of other malignant neoplasm of skin: Secondary | ICD-10-CM | POA: Diagnosis not present

## 2021-10-23 DIAGNOSIS — M179 Osteoarthritis of knee, unspecified: Secondary | ICD-10-CM | POA: Diagnosis present

## 2021-10-23 DIAGNOSIS — Z79899 Other long term (current) drug therapy: Secondary | ICD-10-CM | POA: Diagnosis not present

## 2021-10-23 HISTORY — PX: TOTAL KNEE ARTHROPLASTY: SHX125

## 2021-10-23 SURGERY — ARTHROPLASTY, KNEE, TOTAL
Anesthesia: Spinal | Site: Knee | Laterality: Right

## 2021-10-23 MED ORDER — DOCUSATE SODIUM 100 MG PO CAPS
100.0000 mg | ORAL_CAPSULE | Freq: Two times a day (BID) | ORAL | Status: DC
  Administered 2021-10-23 – 2021-10-24 (×2): 100 mg via ORAL
  Filled 2021-10-23 (×2): qty 1

## 2021-10-23 MED ORDER — DEXAMETHASONE SODIUM PHOSPHATE 10 MG/ML IJ SOLN
8.0000 mg | Freq: Once | INTRAMUSCULAR | Status: AC
  Administered 2021-10-23: 8 mg via INTRAVENOUS

## 2021-10-23 MED ORDER — SODIUM CHLORIDE (PF) 0.9 % IJ SOLN
INTRAMUSCULAR | Status: AC
  Filled 2021-10-23: qty 50

## 2021-10-23 MED ORDER — MIDAZOLAM HCL 5 MG/5ML IJ SOLN
INTRAMUSCULAR | Status: DC | PRN
  Administered 2021-10-23: 1 mg via INTRAVENOUS

## 2021-10-23 MED ORDER — OXYCODONE HCL 5 MG PO TABS
5.0000 mg | ORAL_TABLET | ORAL | Status: DC | PRN
  Administered 2021-10-23 – 2021-10-24 (×3): 5 mg via ORAL
  Filled 2021-10-23 (×2): qty 1

## 2021-10-23 MED ORDER — LIDOCAINE HCL (PF) 2 % IJ SOLN
INTRAMUSCULAR | Status: AC
  Filled 2021-10-23: qty 5

## 2021-10-23 MED ORDER — SODIUM CHLORIDE 0.9 % IR SOLN
Status: DC | PRN
  Administered 2021-10-23: 1000 mL

## 2021-10-23 MED ORDER — OXYCODONE HCL 5 MG PO TABS
10.0000 mg | ORAL_TABLET | ORAL | Status: DC | PRN
  Administered 2021-10-23 – 2021-10-24 (×2): 10 mg via ORAL
  Filled 2021-10-23 (×2): qty 2

## 2021-10-23 MED ORDER — PHENYLEPHRINE HCL (PRESSORS) 10 MG/ML IV SOLN
INTRAVENOUS | Status: AC
  Filled 2021-10-23: qty 1

## 2021-10-23 MED ORDER — MORPHINE SULFATE (PF) 2 MG/ML IV SOLN: 1.0000 mg | INTRAVENOUS | Status: DC | PRN

## 2021-10-23 MED ORDER — SODIUM CHLORIDE (PF) 0.9 % IJ SOLN
INTRAMUSCULAR | Status: DC | PRN
  Administered 2021-10-23: 60 mL

## 2021-10-23 MED ORDER — ROPIVACAINE HCL 5 MG/ML IJ SOLN
INTRAMUSCULAR | Status: DC | PRN
  Administered 2021-10-23: 30 mL via PERINEURAL

## 2021-10-23 MED ORDER — METOCLOPRAMIDE HCL 5 MG/ML IJ SOLN: 5.0000 mg | Freq: Three times a day (TID) | INTRAMUSCULAR | Status: DC | PRN

## 2021-10-23 MED ORDER — ASPIRIN 325 MG PO TBEC
325.0000 mg | DELAYED_RELEASE_TABLET | Freq: Two times a day (BID) | ORAL | Status: DC
  Administered 2021-10-24: 325 mg via ORAL
  Filled 2021-10-23: qty 1

## 2021-10-23 MED ORDER — LACTATED RINGERS IV SOLN: INTRAVENOUS | Status: DC

## 2021-10-23 MED ORDER — SODIUM CHLORIDE 0.9 % IV SOLN: INTRAVENOUS | Status: DC

## 2021-10-23 MED ORDER — PHENOL 1.4 % MT LIQD: 1.0000 | OROMUCOSAL | Status: DC | PRN

## 2021-10-23 MED ORDER — DEXAMETHASONE SODIUM PHOSPHATE 10 MG/ML IJ SOLN
INTRAMUSCULAR | Status: AC
  Filled 2021-10-23: qty 1

## 2021-10-23 MED ORDER — METHOCARBAMOL 500 MG PO TABS
500.0000 mg | ORAL_TABLET | Freq: Four times a day (QID) | ORAL | Status: DC | PRN
  Administered 2021-10-23 – 2021-10-24 (×2): 500 mg via ORAL
  Filled 2021-10-23 (×2): qty 1

## 2021-10-23 MED ORDER — MEPERIDINE HCL 50 MG/ML IJ SOLN: 6.2500 mg | INTRAMUSCULAR | Status: DC | PRN

## 2021-10-23 MED ORDER — CEFAZOLIN SODIUM-DEXTROSE 2-4 GM/100ML-% IV SOLN
2.0000 g | Freq: Four times a day (QID) | INTRAVENOUS | Status: AC
  Administered 2021-10-23 – 2021-10-24 (×2): 2 g via INTRAVENOUS
  Filled 2021-10-23 (×2): qty 100

## 2021-10-23 MED ORDER — ONDANSETRON HCL 4 MG PO TABS: 4.0000 mg | ORAL_TABLET | Freq: Four times a day (QID) | ORAL | Status: DC | PRN

## 2021-10-23 MED ORDER — SODIUM CHLORIDE (PF) 0.9 % IJ SOLN
INTRAMUSCULAR | Status: AC
  Filled 2021-10-23: qty 10

## 2021-10-23 MED ORDER — SUMATRIPTAN SUCCINATE 50 MG PO TABS: 100.0000 mg | ORAL_TABLET | ORAL | Status: DC | PRN

## 2021-10-23 MED ORDER — ONDANSETRON HCL 4 MG/2ML IJ SOLN
INTRAMUSCULAR | Status: DC | PRN
  Administered 2021-10-23: 4 mg via INTRAVENOUS

## 2021-10-23 MED ORDER — 0.9 % SODIUM CHLORIDE (POUR BTL) OPTIME
TOPICAL | Status: DC | PRN
  Administered 2021-10-23: 1000 mL

## 2021-10-23 MED ORDER — ORAL CARE MOUTH RINSE: 15.0000 mL | Freq: Once | OROMUCOSAL | Status: AC

## 2021-10-23 MED ORDER — FENTANYL CITRATE (PF) 100 MCG/2ML IJ SOLN
INTRAMUSCULAR | Status: AC
  Filled 2021-10-23: qty 2

## 2021-10-23 MED ORDER — STERILE WATER FOR IRRIGATION IR SOLN
Status: DC | PRN
  Administered 2021-10-23: 2000 mL

## 2021-10-23 MED ORDER — ACETAMINOPHEN 500 MG PO TABS
1000.0000 mg | ORAL_TABLET | Freq: Four times a day (QID) | ORAL | Status: DC
  Filled 2021-10-23: qty 2

## 2021-10-23 MED ORDER — HYDROMORPHONE HCL 1 MG/ML IJ SOLN: 0.2500 mg | INTRAMUSCULAR | Status: DC | PRN

## 2021-10-23 MED ORDER — ACETAMINOPHEN 325 MG PO TABS: 325.0000 mg | ORAL_TABLET | Freq: Once | ORAL | Status: DC | PRN

## 2021-10-23 MED ORDER — ONDANSETRON HCL 4 MG/2ML IJ SOLN: 4.0000 mg | Freq: Four times a day (QID) | INTRAMUSCULAR | Status: DC | PRN

## 2021-10-23 MED ORDER — MENTHOL 3 MG MT LOZG: 1.0000 | LOZENGE | OROMUCOSAL | Status: DC | PRN

## 2021-10-23 MED ORDER — CHLORHEXIDINE GLUCONATE 0.12 % MT SOLN
15.0000 mL | Freq: Once | OROMUCOSAL | Status: AC
  Administered 2021-10-23: 15 mL via OROMUCOSAL

## 2021-10-23 MED ORDER — BISACODYL 10 MG RE SUPP: 10.0000 mg | Freq: Every day | RECTAL | Status: DC | PRN

## 2021-10-23 MED ORDER — GABAPENTIN 300 MG PO CAPS
300.0000 mg | ORAL_CAPSULE | Freq: Three times a day (TID) | ORAL | Status: DC
  Administered 2021-10-23 – 2021-10-24 (×2): 300 mg via ORAL
  Filled 2021-10-23 (×2): qty 1

## 2021-10-23 MED ORDER — PROPOFOL 500 MG/50ML IV EMUL
INTRAVENOUS | Status: DC | PRN
  Administered 2021-10-23: 25 ug/kg/min via INTRAVENOUS

## 2021-10-23 MED ORDER — SERTRALINE HCL 100 MG PO TABS
100.0000 mg | ORAL_TABLET | Freq: Every day | ORAL | Status: DC
  Administered 2021-10-24: 100 mg via ORAL
  Filled 2021-10-23: qty 1

## 2021-10-23 MED ORDER — BUPIVACAINE LIPOSOME 1.3 % IJ SUSP
INTRAMUSCULAR | Status: DC | PRN
  Administered 2021-10-23: 20 mL

## 2021-10-23 MED ORDER — FENTANYL CITRATE PF 50 MCG/ML IJ SOSY
50.0000 ug | PREFILLED_SYRINGE | INTRAMUSCULAR | Status: DC
  Administered 2021-10-23: 25 ug via INTRAVENOUS
  Filled 2021-10-23: qty 2

## 2021-10-23 MED ORDER — METHOCARBAMOL 500 MG IVPB - SIMPLE MED
500.0000 mg | Freq: Four times a day (QID) | INTRAVENOUS | Status: DC | PRN
  Filled 2021-10-23: qty 50

## 2021-10-23 MED ORDER — OXYCODONE HCL 5 MG PO TABS
ORAL_TABLET | ORAL | Status: AC
  Filled 2021-10-23: qty 1

## 2021-10-23 MED ORDER — ACETAMINOPHEN 10 MG/ML IV SOLN: 1000.0000 mg | Freq: Once | INTRAVENOUS | Status: DC | PRN

## 2021-10-23 MED ORDER — IPRATROPIUM BROMIDE 0.03 % NA SOLN: 2.0000 | Freq: Every day | NASAL | Status: DC

## 2021-10-23 MED ORDER — DIPHENHYDRAMINE HCL 12.5 MG/5ML PO ELIX: 12.5000 mg | ORAL_SOLUTION | ORAL | Status: DC | PRN

## 2021-10-23 MED ORDER — AMISULPRIDE (ANTIEMETIC) 5 MG/2ML IV SOLN: 10.0000 mg | Freq: Once | INTRAVENOUS | Status: DC | PRN

## 2021-10-23 MED ORDER — PANTOPRAZOLE SODIUM 40 MG PO TBEC
40.0000 mg | DELAYED_RELEASE_TABLET | Freq: Every day | ORAL | Status: DC
Start: 2021-10-24 — End: 2021-10-24
  Administered 2021-10-24: 40 mg via ORAL
  Filled 2021-10-23: qty 1

## 2021-10-23 MED ORDER — ACETAMINOPHEN 160 MG/5ML PO SOLN: 325.0000 mg | Freq: Once | ORAL | Status: DC | PRN

## 2021-10-23 MED ORDER — PHENYLEPHRINE HCL (PRESSORS) 10 MG/ML IV SOLN
INTRAVENOUS | Status: DC | PRN
  Administered 2021-10-23: 40 ug via INTRAVENOUS
  Administered 2021-10-23: 80 ug via INTRAVENOUS

## 2021-10-23 MED ORDER — ONDANSETRON HCL 4 MG/2ML IJ SOLN
INTRAMUSCULAR | Status: AC
  Filled 2021-10-23: qty 2

## 2021-10-23 MED ORDER — ACETAMINOPHEN 500 MG PO TABS
1000.0000 mg | ORAL_TABLET | Freq: Four times a day (QID) | ORAL | Status: AC
  Administered 2021-10-23 – 2021-10-24 (×4): 1000 mg via ORAL
  Filled 2021-10-23 (×4): qty 2

## 2021-10-23 MED ORDER — PHENYLEPHRINE 80 MCG/ML (10ML) SYRINGE FOR IV PUSH (FOR BLOOD PRESSURE SUPPORT)
PREFILLED_SYRINGE | INTRAVENOUS | Status: AC
  Filled 2021-10-23: qty 10

## 2021-10-23 MED ORDER — PROPOFOL 1000 MG/100ML IV EMUL
INTRAVENOUS | Status: AC
  Filled 2021-10-23: qty 100

## 2021-10-23 MED ORDER — LIDOCAINE HCL (CARDIAC) PF 100 MG/5ML IV SOSY
PREFILLED_SYRINGE | INTRAVENOUS | Status: DC | PRN
  Administered 2021-10-23: 40 mg via INTRAVENOUS

## 2021-10-23 MED ORDER — CEFAZOLIN SODIUM-DEXTROSE 2-4 GM/100ML-% IV SOLN
2.0000 g | INTRAVENOUS | Status: AC
  Administered 2021-10-23: 2 g via INTRAVENOUS
  Filled 2021-10-23: qty 100

## 2021-10-23 MED ORDER — METOCLOPRAMIDE HCL 5 MG PO TABS: 5.0000 mg | ORAL_TABLET | Freq: Three times a day (TID) | ORAL | Status: DC | PRN

## 2021-10-23 MED ORDER — TRANEXAMIC ACID-NACL 1000-0.7 MG/100ML-% IV SOLN
1000.0000 mg | INTRAVENOUS | Status: AC
  Administered 2021-10-23: 1000 mg via INTRAVENOUS
  Filled 2021-10-23: qty 100

## 2021-10-23 MED ORDER — POVIDONE-IODINE 10 % EX SWAB
2.0000 "application " | Freq: Once | CUTANEOUS | Status: AC
  Administered 2021-10-23: 2 via TOPICAL

## 2021-10-23 MED ORDER — BUPIVACAINE LIPOSOME 1.3 % IJ SUSP
INTRAMUSCULAR | Status: AC
  Filled 2021-10-23: qty 20

## 2021-10-23 MED ORDER — MIDAZOLAM HCL 2 MG/2ML IJ SOLN
INTRAMUSCULAR | Status: AC
  Filled 2021-10-23: qty 2

## 2021-10-23 MED ORDER — POLYETHYLENE GLYCOL 3350 17 G PO PACK: 17.0000 g | PACK | Freq: Every day | ORAL | Status: DC | PRN

## 2021-10-23 MED ORDER — FENTANYL CITRATE (PF) 100 MCG/2ML IJ SOLN
INTRAMUSCULAR | Status: DC | PRN
Start: 2021-10-23 — End: 2021-10-23
  Administered 2021-10-23 (×2): 25 ug via INTRAVENOUS
  Administered 2021-10-23: 50 ug via INTRAVENOUS

## 2021-10-23 MED ORDER — BUPIVACAINE LIPOSOME 1.3 % IJ SUSP: 20.0000 mL | Freq: Once | INTRAMUSCULAR | Status: DC

## 2021-10-23 MED ORDER — FLEET ENEMA 7-19 GM/118ML RE ENEM: 1.0000 | ENEMA | Freq: Once | RECTAL | Status: DC | PRN

## 2021-10-23 MED ORDER — DEXAMETHASONE SODIUM PHOSPHATE 10 MG/ML IJ SOLN
10.0000 mg | Freq: Once | INTRAMUSCULAR | Status: AC
  Administered 2021-10-24: 10 mg via INTRAVENOUS
  Filled 2021-10-23: qty 1

## 2021-10-23 MED ORDER — FERROUS SULFATE 325 (65 FE) MG PO TABS
325.0000 mg | ORAL_TABLET | Freq: Every day | ORAL | Status: DC
  Administered 2021-10-24: 325 mg via ORAL
  Filled 2021-10-23: qty 1

## 2021-10-23 MED ORDER — BUPIVACAINE IN DEXTROSE 0.75-8.25 % IT SOLN
INTRATHECAL | Status: DC | PRN
  Administered 2021-10-23: 1.6 mL via INTRATHECAL

## 2021-10-23 MED ORDER — ACETAMINOPHEN 10 MG/ML IV SOLN
1000.0000 mg | Freq: Four times a day (QID) | INTRAVENOUS | Status: DC
  Administered 2021-10-23: 1000 mg via INTRAVENOUS
  Filled 2021-10-23: qty 100

## 2021-10-23 SURGICAL SUPPLY — 56 items
ATTUNE PS FEM RT SZ 5 CEM KNEE (Femur) ×1 IMPLANT
ATTUNE PSRP INSR SZ5 8 KNEE (Insert) ×1 IMPLANT
BAG COUNTER SPONGE SURGICOUNT (BAG) ×1 IMPLANT
BAG ZIPLOCK 12X15 (MISCELLANEOUS) ×2 IMPLANT
BASE TIBIAL ROT PLAT SZ 5 KNEE (Knees) IMPLANT
BLADE SAG 18X100X1.27 (BLADE) ×2 IMPLANT
BLADE SAW SGTL 11.0X1.19X90.0M (BLADE) ×2 IMPLANT
BNDG ELASTIC 6X5.8 VLCR STR LF (GAUZE/BANDAGES/DRESSINGS) ×2 IMPLANT
BOWL SMART MIX CTS (DISPOSABLE) ×2 IMPLANT
CEMENT HV SMART SET (Cement) ×4 IMPLANT
COVER SURGICAL LIGHT HANDLE (MISCELLANEOUS) ×2 IMPLANT
CUFF TOURN SGL QUICK 34 (TOURNIQUET CUFF) ×2
CUFF TRNQT CYL 34X4.125X (TOURNIQUET CUFF) ×1 IMPLANT
DRAPE INCISE IOBAN 66X45 STRL (DRAPES) ×2 IMPLANT
DRAPE U-SHAPE 47X51 STRL (DRAPES) ×2 IMPLANT
DRSG AQUACEL AG ADV 3.5X10 (GAUZE/BANDAGES/DRESSINGS) ×2 IMPLANT
DURAPREP 26ML APPLICATOR (WOUND CARE) ×2 IMPLANT
ELECT REM PT RETURN 15FT ADLT (MISCELLANEOUS) ×2 IMPLANT
GLOVE BIO SURGEON STRL SZ 6.5 (GLOVE) IMPLANT
GLOVE BIO SURGEON STRL SZ7.5 (GLOVE) ×1 IMPLANT
GLOVE BIO SURGEON STRL SZ8 (GLOVE) ×2 IMPLANT
GLOVE BIOGEL PI IND STRL 6.5 (GLOVE) IMPLANT
GLOVE BIOGEL PI IND STRL 7.0 (GLOVE) IMPLANT
GLOVE BIOGEL PI IND STRL 8 (GLOVE) ×1 IMPLANT
GLOVE BIOGEL PI INDICATOR 6.5 (GLOVE)
GLOVE BIOGEL PI INDICATOR 7.0 (GLOVE)
GLOVE BIOGEL PI INDICATOR 8 (GLOVE) ×1
GOWN STRL REUS W/ TWL LRG LVL3 (GOWN DISPOSABLE) ×1 IMPLANT
GOWN STRL REUS W/ TWL XL LVL3 (GOWN DISPOSABLE) IMPLANT
GOWN STRL REUS W/TWL LRG LVL3 (GOWN DISPOSABLE) ×2
GOWN STRL REUS W/TWL XL LVL3 (GOWN DISPOSABLE) ×2
HANDPIECE INTERPULSE COAX TIP (DISPOSABLE) ×2
HOLDER FOLEY CATH W/STRAP (MISCELLANEOUS) ×1 IMPLANT
IMMOBILIZER KNEE 20 (SOFTGOODS) ×2
IMMOBILIZER KNEE 20 THIGH 36 (SOFTGOODS) ×1 IMPLANT
KIT TURNOVER KIT A (KITS) ×1 IMPLANT
MANIFOLD NEPTUNE II (INSTRUMENTS) ×2 IMPLANT
NS IRRIG 1000ML POUR BTL (IV SOLUTION) ×2 IMPLANT
PACK TOTAL KNEE CUSTOM (KITS) ×2 IMPLANT
PADDING CAST COTTON 6X4 STRL (CAST SUPPLIES) ×3 IMPLANT
PATELLA MEDIAL ATTUN 35MM KNEE (Knees) ×1 IMPLANT
PROTECTOR NERVE ULNAR (MISCELLANEOUS) ×2 IMPLANT
SET HNDPC FAN SPRY TIP SCT (DISPOSABLE) ×1 IMPLANT
SPIKE FLUID TRANSFER (MISCELLANEOUS) ×2 IMPLANT
STRIP CLOSURE SKIN 1/2X4 (GAUZE/BANDAGES/DRESSINGS) ×3 IMPLANT
SUT MNCRL AB 4-0 PS2 18 (SUTURE) ×2 IMPLANT
SUT STRATAFIX 0 PDS 27 VIOLET (SUTURE) ×2
SUT VIC AB 2-0 CT1 27 (SUTURE) ×6
SUT VIC AB 2-0 CT1 TAPERPNT 27 (SUTURE) ×3 IMPLANT
SUTURE STRATFX 0 PDS 27 VIOLET (SUTURE) ×1 IMPLANT
TIBIAL BASE ROT PLAT SZ 5 KNEE (Knees) ×2 IMPLANT
TRAY FOLEY MTR SLVR 14FR STAT (SET/KITS/TRAYS/PACK) ×1 IMPLANT
TRAY FOLEY MTR SLVR 16FR STAT (SET/KITS/TRAYS/PACK) ×1 IMPLANT
TUBE SUCTION HIGH CAP CLEAR NV (SUCTIONS) ×2 IMPLANT
WATER STERILE IRR 1000ML POUR (IV SOLUTION) ×4 IMPLANT
WRAP KNEE MAXI GEL POST OP (GAUZE/BANDAGES/DRESSINGS) ×2 IMPLANT

## 2021-10-23 NOTE — Discharge Instructions (Signed)
 Frank Aluisio, MD Total Joint Specialist [facility] [place] [address] [place], [place] [address] [phone]  TOTAL KNEE REPLACEMENT POSTOPERATIVE DIRECTIONS    Knee Rehabilitation, Guidelines Following Surgery  Results after knee surgery are often greatly improved when you follow the exercise, range of motion and muscle strengthening exercises prescribed by your doctor. Safety measures are also important to protect the knee from further injury. If any of these exercises cause you to have increased pain or swelling in your knee joint, decrease the amount until you are comfortable again and slowly increase them. If you have problems or questions, call your caregiver or physical therapist for advice.   BLOOD CLOT PREVENTION Take a 325 mg Aspirin two times a day for three weeks following surgery. Then take an 81 mg Aspirin once a day for three weeks. Then discontinue Aspirin. You may resume your vitamins/supplements upon discharge from the hospital. Do not take any NSAIDs (Advil, Aleve, Ibuprofen, Meloxicam, etc.) until you have discontinued the 325 mg Aspirin.  HOME CARE INSTRUCTIONS  Remove items at home which could result in a fall. This includes throw rugs or furniture in walking pathways.  ICE to the affected knee as much as tolerated. Icing helps control swelling. If the swelling is well controlled you will be more comfortable and rehab easier. Continue to use ice on the knee for pain and swelling from surgery. You may notice swelling that will progress down to the foot and ankle. This is normal after surgery. Elevate the leg when you are not up walking on it.    Continue to use the breathing machine which will help keep your temperature down. It is common for your temperature to cycle up and down following surgery, especially at night when you are not up moving around and exerting yourself. The breathing machine keeps your lungs expanded and your temperature  down. Do not place pillow under the operative knee, focus on keeping the knee straight while resting  DIET You may resume your previous home diet once you are discharged from the hospital.  DRESSING / WOUND CARE / SHOWERING Keep your bulky bandage on for 2 days. On the third post-operative day you may remove the Ace bandage and gauze. There is a waterproof adhesive bandage on your skin which will stay in place until your first follow-up appointment. Once you remove this you will not need to place another bandage You may begin showering 3 days following surgery, but do not submerge the incision under water.  ACTIVITY For the first 5 days, the key is rest and control of pain and swelling Do your home exercises twice a day starting on post-operative day 3. On the days you go to physical therapy, just do the home exercises once that day. You should rest, ice and elevate the leg for 50 minutes out of every hour. Get up and walk/stretch for 10 minutes per hour. After 5 days you can increase your activity slowly as tolerated. Walk with your walker as instructed. Use the walker until you are comfortable transitioning to a cane. Walk with the cane in the opposite hand of the operative leg. You may discontinue the cane once you are comfortable and walking steadily. Avoid periods of inactivity such as sitting longer than an hour when not asleep. This helps prevent blood clots.  You may discontinue the knee immobilizer once you are able to perform a straight leg raise while lying down. You may resume a sexual relationship in one month   or when given the OK by your doctor.  You may return to work once you are cleared by your doctor.  Do not drive a car for 6 weeks or until released by your surgeon.  Do not drive while taking narcotics.  TED HOSE STOCKINGS Wear the elastic stockings on both legs for three weeks following surgery during the day. You may remove them at night for sleeping.  WEIGHT  BEARING Weight bearing as tolerated with assist device (walker, cane, etc) as directed, use it as long as suggested by your surgeon or therapist, typically at least 4-6 weeks.  POSTOPERATIVE CONSTIPATION PROTOCOL Constipation - defined medically as fewer than three stools per week and severe constipation as less than one stool per week.  One of the most common issues patients have following surgery is constipation.  Even if you have a regular bowel pattern at home, your normal regimen is likely to be disrupted due to multiple reasons following surgery.  Combination of anesthesia, postoperative narcotics, change in appetite and fluid intake all can affect your bowels.  In order to avoid complications following surgery, here are some recommendations in order to help you during your recovery period.  Colace (docusate) - Pick up an over-the-counter form of Colace or another stool softener and take twice a day as long as you are requiring postoperative pain medications.  Take with a full glass of water daily.  If you experience loose stools or diarrhea, hold the colace until you stool forms back up. If your symptoms do not get better within 1 week or if they get worse, check with your doctor. Dulcolax (bisacodyl) - Pick up over-the-counter and take as directed by the product packaging as needed to assist with the movement of your bowels.  Take with a full glass of water.  Use this product as needed if not relieved by Colace only.  MiraLax (polyethylene glycol) - Pick up over-the-counter to have on hand. MiraLax is a solution that will increase the amount of water in your bowels to assist with bowel movements.  Take as directed and can mix with a glass of water, juice, soda, coffee, or tea. Take if you go more than two days without a movement. Do not use MiraLax more than once per day. Call your doctor if you are still constipated or irregular after using this medication for 7 days in a row.  If you continue  to have problems with postoperative constipation, please contact the office for further assistance and recommendations.  If you experience "the worst abdominal pain ever" or develop nausea or vomiting, please contact the office immediatly for further recommendations for treatment.  ITCHING If you experience itching with your medications, try taking only a single pain pill, or even half a pain pill at a time.  You can also use Benadryl over the counter for itching or also to help with sleep.   MEDICATIONS See your medication summary on the "After Visit Summary" that the nursing staff will review with you prior to discharge.  You may have some home medications which will be placed on hold until you complete the course of blood thinner medication.  It is important for you to complete the blood thinner medication as prescribed by your surgeon.  Continue your approved medications as instructed at time of discharge.  PRECAUTIONS If you experience chest pain or shortness of breath - call 911 immediately for transfer to the hospital emergency department.  If you develop a fever greater that 101 F,   purulent drainage from wound, increased redness or drainage from wound, foul odor from the wound/dressing, or calf pain - CONTACT YOUR SURGEON.                                                   FOLLOW-UP APPOINTMENTS Make sure you keep all of your appointments after your operation with your surgeon and caregivers. You should call the office at the above phone number and make an appointment for approximately two weeks after the date of your surgery or on the date instructed by your surgeon outlined in the "After Visit Summary".  RANGE OF MOTION AND STRENGTHENING EXERCISES  Rehabilitation of the knee is important following a knee injury or an operation. After just a few days of immobilization, the muscles of the thigh which control the knee become weakened and shrink (atrophy). Knee exercises are designed to build up  the tone and strength of the thigh muscles and to improve knee motion. Often times heat used for twenty to thirty minutes before working out will loosen up your tissues and help with improving the range of motion but do not use heat for the first two weeks following surgery. These exercises can be done on a training (exercise) mat, on the floor, on a table or on a bed. Use what ever works the best and is most comfortable for you Knee exercises include:  Leg Lifts - While your knee is still immobilized in a splint or cast, you can do straight leg raises. Lift the leg to 60 degrees, hold for 3 sec, and slowly lower the leg. Repeat 10-20 times 2-3 times daily. Perform this exercise against resistance later as your knee gets better.  Quad and Hamstring Sets - Tighten up the muscle on the front of the thigh (Quad) and hold for 5-10 sec. Repeat this 10-20 times hourly. Hamstring sets are done by pushing the foot backward against an object and holding for 5-10 sec. Repeat as with quad sets.  Leg Slides: Lying on your back, slowly slide your foot toward your buttocks, bending your knee up off the floor (only go as far as is comfortable). Then slowly slide your foot back down until your leg is flat on the floor again. Angel Wings: Lying on your back spread your legs to the side as far apart as you can without causing discomfort.  A rehabilitation program following serious knee injuries can speed recovery and prevent re-injury in the future due to weakened muscles. Contact your doctor or a physical therapist for more information on knee rehabilitation.   POST-OPERATIVE OPIOID TAPER INSTRUCTIONS: It is important to wean off of your opioid medication as soon as possible. If you do not need pain medication after your surgery it is ok to stop day one. Opioids include: Codeine, Hydrocodone(Norco, Vicodin), Oxycodone(Percocet, oxycontin) and hydromorphone amongst others.  Long term and even short term use of opiods can  cause: Increased pain response Dependence Constipation Depression Respiratory depression And more.  Withdrawal symptoms can include Flu like symptoms Nausea, vomiting And more Techniques to manage these symptoms Hydrate well Eat regular healthy meals Stay active Use relaxation techniques(deep breathing, meditating, yoga) Do Not substitute Alcohol to help with tapering If you have been on opioids for less than two weeks and do not have pain than it is ok to stop all together.  Plan   to wean off of opioids This plan should start within one week post op of your joint replacement. Maintain the same interval or time between taking each dose and first decrease the dose.  Cut the total daily intake of opioids by one tablet each day Next start to increase the time between doses. The last dose that should be eliminated is the evening dose.   IF YOU ARE TRANSFERRED TO A SKILLED REHAB FACILITY If the patient is transferred to a skilled rehab facility following release from the hospital, a list of the current medications will be sent to the facility for the patient to continue.  When discharged from the skilled rehab facility, please have the facility set up the patient's Home Health Physical Therapy prior to being released. Also, the skilled facility will be responsible for providing the patient with their medications at time of release from the facility to include their pain medication, the muscle relaxants, and their blood thinner medication. If the patient is still at the rehab facility at time of the two week follow up appointment, the skilled rehab facility will also need to assist the patient in arranging follow up appointment in our office and any transportation needs.  MAKE SURE YOU:  Understand these instructions.  Get help right away if you are not doing well or get worse.   DENTAL ANTIBIOTICS:  In most cases prophylactic antibiotics for Dental procdeures after total joint surgery are  not necessary.  Exceptions are as follows:  1. History of prior total joint infection  2. Severely immunocompromised (Organ Transplant, cancer chemotherapy, Rheumatoid biologic meds such as Humera)  3. Poorly controlled diabetes (A1C &gt; 8.0, blood glucose over 200)  If you have one of these conditions, contact your surgeon for an antibiotic prescription, prior to your dental procedure.    Pick up stool softner and laxative for home use following surgery while on pain medications. Do not submerge incision under water. Please use good hand washing techniques while changing dressing each day. May shower starting three days after surgery. Please use a clean towel to pat the incision dry following showers. Continue to use ice for pain and swelling after surgery. Do not use any lotions or creams on the incision until instructed by your surgeon.  

## 2021-10-23 NOTE — Anesthesia Preprocedure Evaluation (Addendum)
Anesthesia Evaluation  Patient identified by MRN, date of birth, ID band Patient awake    Reviewed: Allergy & Precautions, NPO status , Patient's Chart, lab work & pertinent test results  Airway Mallampati: II  TM Distance: >3 FB Neck ROM: Full    Dental  (+) Dental Advisory Given, Implants   Pulmonary    breath sounds clear to auscultation       Cardiovascular  Rhythm:Regular Rate:Normal     Neuro/Psych  Headaches, PSYCHIATRIC DISORDERS Anxiety    GI/Hepatic Neg liver ROS, GERD  Medicated,  Endo/Other  negative endocrine ROS  Renal/GU negative Renal ROS     Musculoskeletal  (+) Arthritis ,   Abdominal Normal abdominal exam  (+)   Peds  Hematology negative hematology ROS (+)   Anesthesia Other Findings   Reproductive/Obstetrics                            Anesthesia Physical Anesthesia Plan  ASA: 2  Anesthesia Plan: Spinal   Post-op Pain Management: Regional block*   Induction: Intravenous  PONV Risk Score and Plan: 0 and Propofol infusion  Airway Management Planned: Natural Airway and Simple Face Mask  Additional Equipment: None  Intra-op Plan:   Post-operative Plan:   Informed Consent: I have reviewed the patients History and Physical, chart, labs and discussed the procedure including the risks, benefits and alternatives for the proposed anesthesia with the patient or authorized representative who has indicated his/her understanding and acceptance.     Dental advisory given  Plan Discussed with: CRNA  Anesthesia Plan Comments: (Lab Results      Component                Value               Date                      WBC                      5.3                 10/12/2021                HGB                      12.5                10/12/2021                HCT                      39.8                10/12/2021                MCV                      78.2 (L)             10/12/2021                PLT                      277                 10/12/2021           )  Anesthesia Quick Evaluation  

## 2021-10-23 NOTE — Transfer of Care (Signed)
Immediate Anesthesia Transfer of Care Note  Patient: Lorraine Woods  Procedure(s) Performed: TOTAL KNEE ARTHROPLASTY (Right: Knee)  Patient Location: PACU  Anesthesia Type:Spinal  Level of Consciousness: awake, alert , oriented and patient cooperative  Airway & Oxygen Therapy: Patient Spontanous Breathing and Patient connected to face mask oxygen  Post-op Assessment: Report given to RN and Post -op Vital signs reviewed and stable  Post vital signs: Reviewed and stable  Last Vitals:  Vitals Value Taken Time  BP 123/71 10/23/21 1418  Temp    Pulse 65 10/23/21 1421  Resp 17 10/23/21 1421  SpO2 99 % 10/23/21 1421  Vitals shown include unvalidated device data.  Last Pain:  Vitals:   10/23/21 1011  TempSrc:   PainSc: 0-No pain      Patients Stated Pain Goal: 4 (70/11/00 3496)  Complications: No notable events documented.

## 2021-10-23 NOTE — Interval H&P Note (Signed)
History and Physical Interval Note:  10/23/2021 10:41 AM  Lorraine Woods  has presented today for surgery, with the diagnosis of right knee osteoarthritis.  The various methods of treatment have been discussed with the patient and family. After consideration of risks, benefits and other options for treatment, the patient has consented to  Procedure(s): TOTAL KNEE ARTHROPLASTY (Right) as a surgical intervention.  The patient's history has been reviewed, patient examined, no change in status, stable for surgery.  I have reviewed the patient's chart and labs.  Questions were answered to the patient's satisfaction.     Pilar Plate Yasseen Salls

## 2021-10-23 NOTE — Anesthesia Postprocedure Evaluation (Signed)
Anesthesia Post Note  Patient: Lorraine Woods  Procedure(s) Performed: TOTAL KNEE ARTHROPLASTY (Right: Knee)     Patient location during evaluation: PACU Anesthesia Type: Spinal Level of consciousness: oriented and awake and alert Pain management: pain level controlled Vital Signs Assessment: post-procedure vital signs reviewed and stable Respiratory status: spontaneous breathing, respiratory function stable and patient connected to nasal cannula oxygen Cardiovascular status: blood pressure returned to baseline and stable Postop Assessment: no headache, no backache, no apparent nausea or vomiting and spinal receding Anesthetic complications: no   No notable events documented.  Last Vitals:  Vitals:   10/23/21 1600 10/23/21 1640  BP: 130/85 132/74  Pulse: 63 65  Resp: 11 16  Temp:  36.6 C  SpO2: 100% 99%    Last Pain:  Vitals:   10/23/21 1640  TempSrc: Oral  PainSc:                  Effie Berkshire

## 2021-10-23 NOTE — Care Plan (Signed)
Ortho Bundle Case Management Note  Patient Details  Name: Lorraine Woods MRN: [id number] Date of Birth: 1945/10/13                  R TKA on 10-23-21 DCP: Home with husband DME: No needs, has a RW PT: [facility] in [place]. Referral faxed for [facility] 10-26-21.   DME Arranged:  N/A DME Agency:     HH Arranged:    HH Agency:     Additional Comments: Please contact me with any questions of if this plan should need to change.  Marianne Sofia, RN,CCM [facility]  [phone] 10/23/2021, 2:33 PM

## 2021-10-23 NOTE — Progress Notes (Signed)
Orthopedic Tech Progress Note Patient Details:  Lorraine Woods 07/03/1945 [id number]  CPM Right Knee CPM Right Knee: On Right Knee Flexion (Degrees): 40 Right Knee Extension (Degrees): 10  Post Interventions Patient Tolerated: Well Instructions Provided: Care of device, Adjustment of device  [place] Pink 10/23/2021, 2:13 PM

## 2021-10-23 NOTE — Op Note (Signed)
OPERATIVE REPORT-TOTAL KNEE ARTHROPLASTY   Pre-operative diagnosis- Osteoarthritis  Right knee(s)  Post-operative diagnosis- Osteoarthritis Right knee(s)  Procedure-  Right  Total Knee Arthroplasty  Surgeon- Lorraine Plover. Clydette Privitera, MD  Assistant- Molli Barrows, PA-C   Anesthesia-   Adductor canal block and spinal  EBL-20 mL   Drains None  Tourniquet time-  Total Tourniquet Time Documented: Thigh (Right) - 38 minutes Total: Thigh (Right) - 38 minutes     Complications- None  Condition-PACU - hemodynamically stable.   Brief Clinical Note  Lorraine Woods is a 76 y.o. year old female with end stage OA of her right knee with progressively worsening pain and dysfunction. She has constant pain, with activity and at rest and significant functional deficits with difficulties even with ADLs. She has had extensive non-op management including analgesics, injections of cortisone and viscosupplements, and home exercise program, but remains in significant pain with significant dysfunction.Radiographs show bone on bone arthritis medial and patellofemoral. She presents now for right Total Knee Arthroplasty.     Procedure in detail---   The patient is brought into the operating room and positioned supine on the operating table. After successful administration of  Adductor canal block and spinal,   a tourniquet is placed high on the  Right thigh(s) and the lower extremity is prepped and draped in the usual sterile fashion. Time out is performed by the operating team and then the  Right lower extremity is wrapped in Esmarch, knee flexed and the tourniquet inflated to 300 mmHg.       A midline incision is made with a ten blade through the subcutaneous tissue to the level of the extensor mechanism. A fresh blade is used to make a medial parapatellar arthrotomy. Soft tissue over the proximal medial tibia is subperiosteally elevated to the joint line with a knife and into the semimembranosus bursa with a Cobb  elevator. Soft tissue over the proximal lateral tibia is elevated with attention being paid to avoiding the patellar tendon on the tibial tubercle. The patella is everted, knee flexed 90 degrees and the ACL and PCL are removed. Findings are bone on bone lateral and patellofemoral with large global osteophytes.         The drill is used to create a starting hole in the distal femur and the canal is thoroughly irrigated with sterile saline to remove the fatty contents. The 5 degree Right  valgus alignment guide is placed into the femoral canal and the distal femoral cutting block is pinned to remove 9 mm off the distal femur. Resection is made with an oscillating saw.      The tibia is subluxed forward and the menisci are removed. The extramedullary alignment guide is placed referencing proximally at the medial aspect of the tibial tubercle and distally along the second metatarsal axis and tibial crest. The block is pinned to remove 32m off the more deficient lateral  side. Resection is made with an oscillating saw. Size 5 is the most appropriate size for the tibia and the proximal tibia is prepared with the modular drill and keel punch for that size.      The femoral sizing guide is placed and size 5 is most appropriate. Rotation is marked off the epicondylar axis and confirmed by creating a rectangular flexion gap at 90 degrees. The size 5 cutting block is pinned in this rotation and the anterior, posterior and chamfer cuts are made with the oscillating saw. The intercondylar block is then placed and that cut  is made.      Trial size 5 tibial component, trial size 5 posterior stabilized femur and a 8  mm posterior stabilized rotating platform insert trial is placed. Full extension is achieved with excellent varus/valgus and anterior/posterior balance throughout full range of motion. The patella is everted and thickness measured to be 24  mm. Free hand resection is taken to 14 mm, a 35 template is placed, lug  holes are drilled, trial patella is placed, and it tracks normally. Osteophytes are removed off the posterior femur with the trial in place. All trials are removed and the cut bone surfaces prepared with pulsatile lavage. Cement is mixed and once ready for implantation, the size 5 tibial implant, size  5 posterior stabilized femoral component, and the size 35 patella are cemented in place and the patella is held with the clamp. The trial insert is placed and the knee held in full extension. The Exparel (20 ml mixed with 60 ml saline) is injected into the extensor mechanism, posterior capsule, medial and lateral gutters and subcutaneous tissues.  All extruded cement is removed and once the cement is hard the permanent 8 mm posterior stabilized rotating platform insert is placed into the tibial tray.      The wound is copiously irrigated with saline solution and the extensor mechanism closed with # 0 Stratofix suture. The tourniquet is released for a total tourniquet time of 38  minutes. Flexion against gravity is 140 degrees and the patella tracks normally. Subcutaneous tissue is closed with 2.0 vicryl and subcuticular with running 4.0 Monocryl. The incision is cleaned and dried and steri-strips and a bulky sterile dressing are applied. The limb is placed into a knee immobilizer and the patient is awakened and transported to recovery in stable condition.      Please note that a surgical assistant was a medical necessity for this procedure in order to perform it in a safe and expeditious manner. Surgical assistant was necessary to retract the ligaments and vital neurovascular structures to prevent injury to them and also necessary for proper positioning of the limb to allow for anatomic placement of the prosthesis.   Lorraine Plover Takirah Binford, MD    10/23/2021, 2:04 PM

## 2021-10-23 NOTE — Anesthesia Procedure Notes (Signed)
Anesthesia Regional Block: Adductor canal block   Pre-Anesthetic Checklist: , timeout performed,  Correct Patient, Correct Site, Correct Laterality,  Correct Procedure, Correct Position, site marked,  Risks and benefits discussed,  Surgical consent,  Pre-op evaluation,  At surgeon's request and post-op pain management  Laterality: Right  Prep: chloraprep       Needles:  Injection technique: Single-shot  Needle Type: Echogenic Stimulator Needle     Needle Length: 9cm  Needle Gauge: 21     Additional Needles:   Procedures:,,,, ultrasound used (permanent image in chart),,    Narrative:  Start time: 10/23/2021 11:50 AM End time: 10/23/2021 11:55 AM Injection made incrementally with aspirations every 5 mL.  Performed by: Personally  Anesthesiologist: Effie Berkshire, MD  Additional Notes: Patient tolerated the procedure well. Local anesthetic introduced in an incremental fashion under minimal resistance after negative aspirations. No paresthesias were elicited. After completion of the procedure, no acute issues were identified and patient continued to be monitored by RN.

## 2021-10-23 NOTE — Anesthesia Procedure Notes (Signed)
Spinal  Start time: 10/23/2021 12:47 PM End time: 10/23/2021 12:49 PM Reason for block: surgical anesthesia Staffing Performed: anesthesiologist  Anesthesiologist: Effie Berkshire, MD Preanesthetic Checklist Completed: patient identified, IV checked, site marked, risks and benefits discussed, surgical consent, monitors and equipment checked, pre-op evaluation and timeout performed Spinal Block Patient position: sitting Prep: DuraPrep and site prepped and draped Location: L3-4 Injection technique: single-shot Needle Needle type: Pencan  Needle gauge: 24 G Needle length: 10 cm Needle insertion depth: 10 cm Additional Notes Patient tolerated well. No immediate complications.  Functioning IV was confirmed and monitors were applied. Sterile prep and drape, including hand hygiene and sterile gloves were used. The patient was positioned and the back was prepped. The skin was anesthetized with lidocaine. Free flow of clear CSF was obtained prior to injecting local anesthetic into the CSF. The spinal needle aspirated freely following injection. The needle was carefully withdrawn. The patient tolerated the procedure well.

## 2021-10-23 NOTE — Evaluation (Signed)
Physical Therapy Evaluation Patient Details Name: Lorraine Woods MRN: [id number] DOB: 1946-04-12 Today's Date: 10/23/2021  History of Present Illness  Pt is a 76yo female presenting s/p R-TKA on 10/23/21. PMH: OA, anxiety, GERD, HLD, OP, L ank ORIF, R wrist ORIF.  Clinical Impression  Lorraine Woods is a 76 y.o. female POD 0 s/p R-TKA. Patient reports independence with mobility at baseline. Patient is now limited by functional impairments (see PT problem list below) and requires min assist for bed mobility and min guard for transfers, further mobility deferred secondary to pain and pt unable to completely clear feet from floor during step pivot transfer. Patient instructed in exercise to facilitate ROM and circulation to manage edema. Patient will benefit from continued skilled PT interventions to address impairments and progress towards PLOF. Acute PT will follow to progress mobility and HEP in preparation for safe discharge home.       Recommendations for follow up therapy are one component of a multi-disciplinary discharge planning process, led by the attending physician.  Recommendations may be updated based on patient status, additional functional criteria and insurance authorization.  Follow Up Recommendations Follow physician's recommendations for discharge plan and follow up therapies    Assistance Recommended at Discharge Set up Supervision/Assistance  Patient can return home with the following  A little help with walking and/or transfers;A little help with bathing/dressing/bathroom;Assistance with cooking/housework;Assist for transportation;Help with stairs or ramp for entrance    Equipment Recommendations None recommended by PT (pt has recommended DME)  Recommendations for Other Services       Functional Status Assessment Patient has had a recent decline in their functional status and demonstrates the ability to make significant improvements in function in a reasonable and predictable  amount of time.     Precautions / Restrictions Precautions Precautions: Fall Required Braces or Orthoses: Knee Immobilizer - Right Knee Immobilizer - Right: On when out of bed or walking Restrictions Weight Bearing Restrictions: No Other Position/Activity Restrictions: WBAT      Mobility  Bed Mobility Overal bed mobility: Needs Assistance Bed Mobility: Supine to Sit     Supine to sit: Min assist     General bed mobility comments: Pt min assist for scooting hips to EOB, otherwise min guard with VCs for use of bed rails.    Transfers Overall transfer level: Needs assistance Equipment used: Rolling walker (2 wheels) Transfers: Sit to/from Stand, Bed to chair/wheelchair/BSC Sit to Stand: Min guard, From elevated surface   Step pivot transfers: Min guard       General transfer comment: Pt min guard for transfers, no physical assist required. Sit to stand transfer from elevated surface with multimodal cues to use BUE to power up. Standing EOB pt completed bilateral weight shifts and standing marches but reported increased pain. Step pivot transfer min guard, no physical assist required, VCs for sequencing of LEs.    Ambulation/Gait               General Gait Details: deferred  Stairs            Wheelchair Mobility    Modified Rankin (Stroke Patients Only)       Balance Overall balance assessment: Needs assistance Sitting-balance support: Feet supported, No upper extremity supported Sitting balance-Leahy Scale: Fair     Standing balance support: Bilateral upper extremity supported, During functional activity, Reliant on assistive device for balance Standing balance-Leahy Scale: Poor  Pertinent Vitals/Pain Pain Assessment Pain Assessment: 0-10 Pain Score: 5  Pain Location: r knee Pain Descriptors / Indicators: Operative site guarding Pain Intervention(s): Limited activity within patient's tolerance,  Monitored during session, Repositioned, Ice applied    Home Living Family/patient expects to be discharged to:: Private residence Living Arrangements: Spouse/significant other Available Help at Discharge: Family;Available 24 hours/day Type of Home: House Home Access: Level entry       Home Layout: One level Home Equipment: Conservation officer, nature (2 wheels);Cane - single point      Prior Function Prior Level of Function : Independent/Modified Independent;History of Falls (last six months)             Mobility Comments: IND (pt reports recent fall while walking on vacation, was not using AD and did not seek treatment. Reports R knee buckled.) ADLs Comments: IND     Hand Dominance        Extremity/Trunk Assessment   Upper Extremity Assessment Upper Extremity Assessment: Overall WFL for tasks assessed    Lower Extremity Assessment Lower Extremity Assessment: RLE deficits/detail;LLE deficits/detail RLE Deficits / Details: MMT ank DF/PF 5/5, no extensor lag noted RLE Sensation: WNL LLE Deficits / Details: MMT ank DF/PF 5/5 LLE Sensation: WNL    Cervical / Trunk Assessment Cervical / Trunk Assessment: Normal  Communication   Communication: No difficulties  Cognition Arousal/Alertness: Awake/alert Behavior During Therapy: WFL for tasks assessed/performed Overall Cognitive Status: Within Functional Limits for tasks assessed                                          General Comments      Exercises Total Joint Exercises Ankle Circles/Pumps: AROM, 20 reps, Both Other Exercises Other Exercises: incentive spirometry x5, VCs for technique   Assessment/Plan    PT Assessment Patient needs continued PT services  PT Problem List Decreased strength;Decreased range of motion;Decreased activity tolerance;Decreased balance;Decreased mobility;Decreased coordination;Decreased knowledge of use of DME;Decreased safety awareness;Pain       PT Treatment Interventions  DME instruction;Gait training;Stair training;Functional mobility training;Therapeutic activities;Therapeutic exercise;Balance training;Neuromuscular re-education;Patient/family education    PT Goals (Current goals can be found in the Care Plan section)  Acute Rehab PT Goals Patient Stated Goal: to walk better PT Goal Formulation: With patient Time For Goal Achievement: 10/30/21 Potential to Achieve Goals: Good    Frequency 7X/week     Co-evaluation               AM-PAC PT "6 Clicks" Mobility  Outcome Measure Help needed turning from your back to your side while in a flat bed without using bedrails?: None Help needed moving from lying on your back to sitting on the side of a flat bed without using bedrails?: A Little Help needed moving to and from a bed to a chair (including a wheelchair)?: A Little Help needed standing up from a chair using your arms (e.g., wheelchair or bedside chair)?: A Little Help needed to walk in hospital room?: A Little Help needed climbing 3-5 steps with a railing? : A Little 6 Click Score: 19    End of Session Equipment Utilized During Treatment: Gait belt Activity Tolerance: No increased pain;Patient tolerated treatment well Patient left: in chair;with call bell/phone within reach;with chair alarm set Nurse Communication: Mobility status PT Visit Diagnosis: Difficulty in walking, not elsewhere classified (R26.2);Pain Pain - Right/Left: Right Pain - part of body: Knee  Time: 1757-1820 PT Time Calculation (min) (ACUTE ONLY): 23 min   Charges:   PT Evaluation $PT Eval Low Complexity: 1 Low PT Treatments $Therapeutic Activity: 8-22 mins        Coolidge Breeze, PT, DPT [facility] Department Office: [phone] Pager: [phone]  Coolidge Breeze 10/23/2021, 6:55 PM

## 2021-10-24 ENCOUNTER — Other Ambulatory Visit: Payer: Self-pay

## 2021-10-24 ENCOUNTER — Encounter (HOSPITAL_COMMUNITY): Payer: Self-pay | Admitting: Orthopedic Surgery

## 2021-10-24 DIAGNOSIS — Z85828 Personal history of other malignant neoplasm of skin: Secondary | ICD-10-CM | POA: Diagnosis not present

## 2021-10-24 DIAGNOSIS — Z79899 Other long term (current) drug therapy: Secondary | ICD-10-CM | POA: Diagnosis not present

## 2021-10-24 DIAGNOSIS — M1711 Unilateral primary osteoarthritis, right knee: Secondary | ICD-10-CM | POA: Diagnosis not present

## 2021-10-24 LAB — CBC
HCT: 35.1 % — ABNORMAL LOW (ref 36.0–46.0)
Hemoglobin: 11.2 g/dL — ABNORMAL LOW (ref 12.0–15.0)
MCH: 25.4 pg — ABNORMAL LOW (ref 26.0–34.0)
MCHC: 31.9 g/dL (ref 30.0–36.0)
MCV: 79.6 fL — ABNORMAL LOW (ref 80.0–100.0)
Platelets: 224 10*3/uL (ref 150–400)
RBC: 4.41 MIL/uL (ref 3.87–5.11)
RDW: 18.7 % — ABNORMAL HIGH (ref 11.5–15.5)
WBC: 8.9 10*3/uL (ref 4.0–10.5)
nRBC: 0 % (ref 0.0–0.2)

## 2021-10-24 LAB — BASIC METABOLIC PANEL
Anion gap: 6 (ref 5–15)
BUN: 15 mg/dL (ref 8–23)
CO2: 26 mmol/L (ref 22–32)
Calcium: 8.8 mg/dL — ABNORMAL LOW (ref 8.9–10.3)
Chloride: 109 mmol/L (ref 98–111)
Creatinine, Ser: 0.74 mg/dL (ref 0.44–1.00)
GFR, Estimated: >60 mL/min (ref >60)
Glucose, Bld: 146 mg/dL — ABNORMAL HIGH (ref 70–99)
Potassium: 4.4 mmol/L (ref 3.5–5.1)
Sodium: 141 mmol/L (ref 135–145)

## 2021-10-24 MED ORDER — ASPIRIN 325 MG PO TBEC
325.0000 mg | DELAYED_RELEASE_TABLET | Freq: Two times a day (BID) | ORAL | 0 refills | Status: AC
Start: 2021-10-24 — End: 2021-11-13

## 2021-10-24 MED ORDER — METHOCARBAMOL 500 MG PO TABS: 500.0000 mg | ORAL_TABLET | Freq: Four times a day (QID) | ORAL | 0 refills | Status: DC | PRN

## 2021-10-24 MED ORDER — OXYCODONE HCL 5 MG PO TABS: 5.0000 mg | ORAL_TABLET | Freq: Four times a day (QID) | ORAL | 0 refills | Status: DC | PRN

## 2021-10-24 MED ORDER — GABAPENTIN 300 MG PO CAPS: ORAL_CAPSULE | ORAL | 0 refills | Status: DC

## 2021-10-24 NOTE — Progress Notes (Signed)
Physical Therapy Treatment Patient Details Name: Lorraine Woods MRN: [id number] DOB: [birth year] Today's Date: 10/24/2021   History of Present Illness Pt is a 76yo female presenting s/p R-TKA on 10/23/21. PMH: OA, anxiety, GERD, HLD, OP, L ank ORIF, R wrist ORIF.    PT Comments    Progressing with mobility. Will plan to have a 2nd session prior to possible d/c home later today.    Recommendations for follow up therapy are one component of a multi-disciplinary discharge planning process, led by the attending physician.  Recommendations may be updated based on patient status, additional functional criteria and insurance authorization.  Follow Up Recommendations  Follow physician's recommendations for discharge plan and follow up therapies     Assistance Recommended at Discharge    Patient can return home with the following A little help with walking and/or transfers;A little help with bathing/dressing/bathroom;Assistance with cooking/housework;Assist for transportation;Help with stairs or ramp for entrance   Equipment Recommendations  None recommended by PT    Recommendations for Other Services       Precautions / Restrictions Precautions Precautions: Fall Restrictions Weight Bearing Restrictions: No Other Position/Activity Restrictions: WBAT     Mobility  Bed Mobility               General bed mobility comments: oob in recliner    Transfers Overall transfer level: Needs assistance Equipment used: Rolling walker (2 wheels) Transfers: Sit to/from Stand Sit to Stand: Min guard           General transfer comment: Min guard for safety. Cues for safety, technique, hand/LE placement.    Ambulation/Gait Ambulation/Gait assistance: Min guard Gait Distance (Feet): 135 Feet Assistive device: Rolling walker (2 wheels) Gait Pattern/deviations: Step-through pattern, Decreased stride length       General Gait Details: Cues for safety, step length. Min guard for safety.  Mild lightheadedness but pt tolerated distance well   Stairs             Wheelchair Mobility    Modified Rankin (Stroke Patients Only)       Balance Overall balance assessment: Needs assistance         Standing balance support: Bilateral upper extremity supported, During functional activity, Reliant on assistive device for balance Standing balance-Leahy Scale: Poor                              Cognition Arousal/Alertness: Awake/alert Behavior During Therapy: WFL for tasks assessed/performed Overall Cognitive Status: Within Functional Limits for tasks assessed                                          Exercises Total Joint Exercises Ankle Circles/Pumps: AROM, Both, 10 reps Quad Sets: AROM, Both, 10 reps Hip ABduction/ADduction: AAROM, Right, 10 reps, AROM Straight Leg Raises: AROM, Right, 10 reps, AAROM Knee Flexion: AROM, Right, 10 reps, Seated Goniometric ROM: ~10-65 degrees    General Comments        Pertinent Vitals/Pain Pain Assessment Pain Assessment: 0-10 Pain Score: 7  Pain Location: R thigh Pain Descriptors / Indicators: Discomfort, Sore, Operative site guarding Pain Intervention(s): Limited activity within patient's tolerance, Monitored during session, Repositioned, Ice applied    Home Living  Prior Function            PT Goals (current goals can now be found in the care plan section) Progress towards PT goals: Progressing toward goals    Frequency    7X/week      PT Plan Current plan remains appropriate    Co-evaluation              AM-PAC PT "6 Clicks" Mobility   Outcome Measure  Help needed turning from your back to your side while in a flat bed without using bedrails?: A Little Help needed moving from lying on your back to sitting on the side of a flat bed without using bedrails?: A Little Help needed moving to and from a bed to a chair (including a  wheelchair)?: A Little Help needed standing up from a chair using your arms (e.g., wheelchair or bedside chair)?: A Little Help needed to walk in hospital room?: A Little Help needed climbing 3-5 steps with a railing? : A Little 6 Click Score: 18    End of Session Equipment Utilized During Treatment: Gait belt Activity Tolerance: Patient tolerated treatment well Patient left: in chair;with call bell/phone within reach   PT Visit Diagnosis: Other abnormalities of gait and mobility (R26.89) Pain - Right/Left: Right Pain - part of body: Leg     Time: 6644-0347 PT Time Calculation (min) (ACUTE ONLY): 19 min  Charges:  $Gait Training: 8-22 mins                         Doreatha Massed, PT Acute Rehabilitation  Office: [phone] Pager: [phone]

## 2021-10-24 NOTE — Progress Notes (Signed)
Orthopedic Tech Progress Note Patient Details:  Lorraine Woods 1945-10-27 [id number]  Patient ID: Lorraine Woods, female   DOB: 02/14/1946, 76 y.o.   MRN: [id number] Patient currently up in chair, will revisit later to apply [place] 10/24/2021, 2:07 PM

## 2021-10-24 NOTE — Progress Notes (Signed)
Physical Therapy Treatment Patient Details Name: Lorraine Woods MRN: [id number] DOB: Oct 02, 1945 Today's Date: 10/24/2021   History of Present Illness Pt is a 76yo female presenting s/p R-TKA on 10/23/21. PMH: OA, anxiety, GERD, HLD, OP, L ank ORIF, R wrist ORIF.    PT Comments    Pt continues to participate well. Moderate pain with activity. Reviewed a few exercises she could perform at home if tolerated (ankle pumps, quad sets, seated knee flexion). Encouraged her to ambulate often at home. All education completed.     Recommendations for follow up therapy are one component of a multi-disciplinary discharge planning process, led by the attending physician.  Recommendations may be updated based on patient status, additional functional criteria and insurance authorization.  Follow Up Recommendations  Follow physician's recommendations for discharge plan and follow up therapies     Assistance Recommended at Discharge PRN  Patient can return home with the following A little help with walking and/or transfers;A little help with bathing/dressing/bathroom;Assistance with cooking/housework;Assist for transportation;Help with stairs or ramp for entrance   Equipment Recommendations  None recommended by PT    Recommendations for Other Services       Precautions / Restrictions Precautions Precautions: Fall Restrictions Weight Bearing Restrictions: No Other Position/Activity Restrictions: WBAT     Mobility  Bed Mobility               General bed mobility comments: oob in recliner    Transfers Overall transfer level: Needs assistance Equipment used: Rolling walker (2 wheels) Transfers: Sit to/from Stand Sit to Stand: Min guard           General transfer comment: Min guard for safety. Cues for safety, technique, hand/LE placement.    Ambulation/Gait Ambulation/Gait assistance: Min guard Gait Distance (Feet): 135 Feet Assistive device: Rolling walker (2 wheels) Gait  Pattern/deviations: Step-through pattern, Decreased stride length       General Gait Details: Cues for safety, step length. Min guard for safety.pt tolerated distance well   Stairs             Wheelchair Mobility    Modified Rankin (Stroke Patients Only)       Balance Overall balance assessment: Needs assistance         Standing balance support: Bilateral upper extremity supported, During functional activity, Reliant on assistive device for balance Standing balance-Leahy Scale: Fair                              Cognition Arousal/Alertness: Awake/alert Behavior During Therapy: WFL for tasks assessed/performed Overall Cognitive Status: Within Functional Limits for tasks assessed                                          Exercises     General Comments        Pertinent Vitals/Pain Pain Assessment Pain Assessment: 0-10 Pain Score: 7  Pain Location: R thigh/sides of knee Pain Descriptors / Indicators: Discomfort, Sore, Operative site guarding Pain Intervention(s): Limited activity within patient's tolerance, Monitored during session, Repositioned, Patient requesting pain meds-RN notified    Home Living                          Prior Function            PT Goals (current goals can now  be found in the care plan section) Progress towards PT goals: Progressing toward goals    Frequency    7X/week      PT Plan Current plan remains appropriate    Co-evaluation              AM-PAC PT "6 Clicks" Mobility   Outcome Measure  Help needed turning from your back to your side while in a flat bed without using bedrails?: None Help needed moving from lying on your back to sitting on the side of a flat bed without using bedrails?: A Little Help needed moving to and from a bed to a chair (including a wheelchair)?: A Little Help needed standing up from a chair using your arms (e.g., wheelchair or bedside chair)?: A  Little Help needed to walk in hospital room?: A Little Help needed climbing 3-5 steps with a railing? : A Little 6 Click Score: 19    End of Session Equipment Utilized During Treatment: Gait belt Activity Tolerance: Patient tolerated treatment well Patient left: in chair;with call bell/phone within reach   PT Visit Diagnosis: Other abnormalities of gait and mobility (R26.89) Pain - Right/Left: Right Pain - part of body: Knee     Time: 1342-1350 PT Time Calculation (min) (ACUTE ONLY): 8 min  Charges:  $Gait Training: 8-22 mins                        Doreatha Massed, PT Acute Rehabilitation  Office: [phone] Pager: [phone]

## 2021-10-24 NOTE — TOC Transition Note (Signed)
Transition of Care [facility]) - CM/SW Discharge Note   Patient Details  Name: Lorraine Woods MRN: [id number] Date of Birth: 06/15/1945  [facility] CM/SW Contact:  Lennart Pall, LCSW Phone Number: 10/24/2021, 9:53 AM   Clinical Narrative:    Met briefly with pt and confirming she has all needed DME at home.  OPPT arranged with [facility] in [place].  No TOC needs.   Final next level of care: OP Rehab Barriers to Discharge: No Barriers Identified   Patient Goals and CMS Choice Patient states their goals for this hospitalization and ongoing recovery are:: return home      Discharge Placement                       Discharge Plan and Services                DME Arranged: N/A                    Social Determinants of Health (SDOH) Interventions     Readmission Risk Interventions     View : No data to display.

## 2021-10-24 NOTE — Progress Notes (Signed)
Subjective: 1 Day Post-Op Procedure(s) (LRB): TOTAL KNEE ARTHROPLASTY (Right) Patient reports pain as mild.   Patient seen in rounds by Dr. Wynelle Woods. Patient is well, and has had no acute complaints or problems No issues overnight. Denies chest pain, SOB, or calf pain. Foley catheter removed this AM.  We will continue therapy today, did not ambulate yesterday.  Objective: Vital signs in last 24 hours: Temp:  [97.6 F (36.4 C)-98.1 F (36.7 C)] 97.8 F (36.6 C) (05/23 0545) Pulse Rate:  [57-75] 58 (05/23 0545) Resp:  [11-19] 18 (05/23 0545) BP: (120-149)/(57-85) 123/69 (05/23 0545) SpO2:  [96 %-100 %] 97 % (05/23 0545) Weight:  [84.8 kg] 84.8 kg (05/22 1011)  Intake/Output from previous day:  Intake/Output Summary (Last 24 hours) at 10/24/2021 0741 Last data filed at 10/24/2021 0700 Gross per 24 hour  Intake 3232.07 ml  Output 3150 ml  Net 82.07 ml     Intake/Output this shift: No intake/output data recorded.  Labs: Recent Labs    10/24/21 0307  HGB 11.2*   Recent Labs    10/24/21 0307  WBC 8.9  RBC 4.41  HCT 35.1*  PLT 224   Recent Labs    10/24/21 0307  NA 141  K 4.4  CL 109  CO2 26  BUN 15  CREATININE 0.74  GLUCOSE 146*  CALCIUM 8.8*   No results for input(s): LABPT, INR in the last 72 hours.  Exam: General - Patient is Alert and Oriented Extremity - Neurologically intact Neurovascular intact Sensation intact distally Dorsiflexion/Plantar flexion intact Dressing - dressing C/D/I Motor Function - intact, moving foot and toes well on exam.   Past Medical History:  Diagnosis Date   Arthritis    Cancer ([place])    BASAL CELL ON NOSE   GAD (generalized anxiety disorder)    GERD (gastroesophageal reflux disease)    History of basal cell carcinoma (BCC) excision    nose  x2  last one 2019   Hyperlipidemia    Knee pain, chronic    Migraine    Osteopenia    PMB (postmenopausal bleeding)    Seasonal allergies    Thickened endometrium     Wears glasses     Assessment/Plan: 1 Day Post-Op Procedure(s) (LRB): TOTAL KNEE ARTHROPLASTY (Right) Principal Problem:   OA (osteoarthritis) of knee Active Problems:   Primary osteoarthritis of right knee  Estimated body mass index is 26.83 kg/m as calculated from the following:   Height as of this encounter: '5\' 10"'$  (1.778 m).   Weight as of this encounter: 84.8 kg. Advance diet Up with therapy D/C IV fluids   Patient's anticipated LOS is less than 2 midnights, meeting these requirements: - Younger than 35 - Lives within 1 hour of care - Has a competent adult at home to recover with post-op recover - NO history of  - Chronic pain requiring opiods  - Diabetes  - Coronary Artery Disease  - Heart failure  - Heart attack  - Stroke  - DVT/VTE  - Cardiac arrhythmia  - Respiratory Failure/COPD  - Renal failure  - Advanced Liver disease  DVT Prophylaxis - Aspirin Weight bearing as tolerated. Continue therapy.  Plan is to go Home after hospital stay. Plan for discharge later today if progresses with therapy and meeting goals. Scheduled for OPPT at [facility]). Follow-up in the office in 2 weeks.  The PDMP database was reviewed today prior to any opioid medications being prescribed to this patient.  Theresa Duty, PA-C Orthopedic  Surgery [phone] 10/24/2021, 7:41 AM

## 2021-10-25 NOTE — Discharge Summary (Signed)
Patient ID: Lorraine Woods MRN: [id number] DOB/AGE: 12-06-1945 76 y.o.  Admit date: 10/23/2021 Discharge date: 10/24/2021  Admission Diagnoses:  Principal Problem:   OA (osteoarthritis) of knee Active Problems:   Primary osteoarthritis of right knee   Discharge Diagnoses:  Same  Past Medical History:  Diagnosis Date   Arthritis    Cancer ([place])    BASAL CELL ON NOSE   GAD (generalized anxiety disorder)    GERD (gastroesophageal reflux disease)    History of basal cell carcinoma (BCC) excision    nose  x2  last one 2019   Hyperlipidemia    Knee pain, chronic    Migraine    Osteopenia    PMB (postmenopausal bleeding)    Seasonal allergies    Thickened endometrium    Wears glasses     Surgeries: Procedure(s): TOTAL KNEE ARTHROPLASTY on 10/23/2021   Consultants:   Discharged Condition: Improved  Hospital Course: Lorraine Woods is an 76 y.o. female who was admitted 10/23/2021 for operative treatment ofOA (osteoarthritis) of knee. Patient has severe unremitting pain that affects sleep, daily activities, and work/hobbies. After pre-op clearance the patient was taken to the operating room on 10/23/2021 and underwent  Procedure(s): TOTAL KNEE ARTHROPLASTY.    Patient was given perioperative antibiotics:  Anti-infectives (From admission, onward)    Start     Dose/Rate Route Frequency Ordered Stop   10/23/21 1830  ceFAZolin (ANCEF) IVPB 2g/100 mL premix        2 g 200 mL/hr over 30 Minutes Intravenous Every 6 hours 10/23/21 1648 10/24/21 0031   10/23/21 1000  ceFAZolin (ANCEF) IVPB 2g/100 mL premix        2 g 200 mL/hr over 30 Minutes Intravenous On call to O.R. 10/23/21 9480 10/23/21 1258        Patient was given sequential compression devices, early ambulation, and chemoprophylaxis to prevent DVT.  Patient benefited maximally from hospital stay and there were no complications.    Recent vital signs: No data found.   Recent laboratory studies:  Recent Labs     10/24/21 0307  WBC 8.9  HGB 11.2*  HCT 35.1*  PLT 224  NA 141  K 4.4  CL 109  CO2 26  BUN 15  CREATININE 0.74  GLUCOSE 146*  CALCIUM 8.8*     Discharge Medications:   Allergies as of 10/24/2021       Reactions   Aspirin Other (See Comments)   Bruising.    Excedrin Migraine [aspirin-acetaminophen-caffeine] Other (See Comments)   Bruising        Medication List     STOP taking these medications    naproxen sodium 220 MG tablet Commonly known as: ALEVE       TAKE these medications    aspirin EC 325 MG tablet Take 1 tablet (325 mg total) by mouth 2 (two) times daily for 20 days. Then take one 81 mg aspirin once a day for three weeks. Then discontinue aspirin.   CALTRATE 600+D3 PO Take 1 tablet by mouth daily.   cholecalciferol 25 MCG (1000 UNIT) tablet Commonly known as: VITAMIN D3 Take 1,000 Units by mouth daily.   ferrous sulfate 325 (65 FE) MG tablet Take 325 mg by mouth daily with breakfast.   gabapentin 300 MG capsule Commonly known as: NEURONTIN Take a 300 mg capsule three times a day for two weeks following surgery.Then take a 300 mg capsule two times a day for two weeks. Then take a 300 mg capsule  once a day for two weeks. Then discontinue.   ipratropium 0.03 % nasal spray Commonly known as: ATROVENT SPRAY 2 SPRAYS INTO BOTH NOSTRILS EVERY 12 HOURS What changed: See the new instructions.   methocarbamol 500 MG tablet Commonly known as: ROBAXIN Take 1 tablet (500 mg total) by mouth every 6 (six) hours as needed for muscle spasms.   minoxidil 2 % external solution Commonly known as: ROGAINE Apply 1 application. topically 3 (three) times a week.   multivitamin with minerals Tabs tablet Take 1 tablet by mouth every morning.   omeprazole 20 MG capsule Commonly known as: PRILOSEC TAKE 1 CAPSULE BY MOUTH EVERY DAY   oxyCODONE 5 MG immediate release tablet Commonly known as: Oxy IR/ROXICODONE Take 1-2 tablets (5-10 mg total) by mouth every 6  (six) hours as needed for severe pain.   sertraline 100 MG tablet Commonly known as: ZOLOFT TAKE 1 TABLET BY MOUTH EVERY DAY IN THE MORNING   SUMAtriptan 100 MG tablet Commonly known as: IMITREX TAKE 1 TABLET BY MOUTH EVERY 2HRS AS NEEDED FOR MIGRAINE. MAY REPEAT IN 2HRS IF HEADACHE PERSISTS               Discharge Care Instructions  (From admission, onward)           Start     Ordered   10/24/21 0000  Weight bearing as tolerated        10/24/21 0743   10/24/21 0000  Change dressing       Comments: You may remove the bulky bandage (ACE wrap and gauze) two days after surgery. You will have an adhesive waterproof bandage underneath. Leave this in place until your first follow-up appointment.   10/24/21 0743            Diagnostic Studies: No results found.  Disposition: Discharge disposition: 01-Home or Self Care       Discharge Instructions     Call MD / Call 911   Complete by: As directed    If you experience chest pain or shortness of breath, CALL 911 and be transported to the hospital emergency room.  If you develope a fever above 101 F, pus (white drainage) or increased drainage or redness at the wound, or calf pain, call your surgeon's office.   Change dressing   Complete by: As directed    You may remove the bulky bandage (ACE wrap and gauze) two days after surgery. You will have an adhesive waterproof bandage underneath. Leave this in place until your first follow-up appointment.   Constipation Prevention   Complete by: As directed    Drink plenty of fluids.  Prune juice may be helpful.  You may use a stool softener, such as Colace (over the counter) 100 mg twice a day.  Use MiraLax (over the counter) for constipation as needed.   Diet - low sodium heart healthy   Complete by: As directed    Do not put a pillow under the knee. Place it under the heel.   Complete by: As directed    Driving restrictions   Complete by: As directed    No driving for  two weeks   Post-operative opioid taper instructions:   Complete by: As directed    POST-OPERATIVE OPIOID TAPER INSTRUCTIONS: It is important to wean off of your opioid medication as soon as possible. If you do not need pain medication after your surgery it is ok to stop day one. Opioids include: Codeine, Hydrocodone(Norco, Vicodin), Oxycodone(Percocet, oxycontin) and  hydromorphone amongst others.  Long term and even short term use of opiods can cause: Increased pain response Dependence Constipation Depression Respiratory depression And more.  Withdrawal symptoms can include Flu like symptoms Nausea, vomiting And more Techniques to manage these symptoms Hydrate well Eat regular healthy meals Stay active Use relaxation techniques(deep breathing, meditating, yoga) Do Not substitute Alcohol to help with tapering If you have been on opioids for less than two weeks and do not have pain than it is ok to stop all together.  Plan to wean off of opioids This plan should start within one week post op of your joint replacement. Maintain the same interval or time between taking each dose and first decrease the dose.  Cut the total daily intake of opioids by one tablet each day Next start to increase the time between doses. The last dose that should be eliminated is the evening dose.      TED hose   Complete by: As directed    Use stockings (TED hose) for three weeks on both leg(s).  You may remove them at night for sleeping.   Weight bearing as tolerated   Complete by: As directed         Follow-up Information     Aluisio, Pilar Plate, MD. Schedule an appointment as soon as possible for a visit in 2 week(s).   Specialty: Orthopedic Surgery Contact information: [address] [place] [address] [phone]                  Signed: Theresa Duty 10/25/2021, 7:51 AM

## 2021-10-27 DIAGNOSIS — M25561 Pain in right knee: Secondary | ICD-10-CM | POA: Diagnosis not present

## 2021-10-27 DIAGNOSIS — M6281 Muscle weakness (generalized): Secondary | ICD-10-CM | POA: Diagnosis not present

## 2021-10-27 DIAGNOSIS — R2689 Other abnormalities of gait and mobility: Secondary | ICD-10-CM | POA: Diagnosis not present

## 2021-10-27 DIAGNOSIS — Z96651 Presence of right artificial knee joint: Secondary | ICD-10-CM | POA: Diagnosis not present

## 2021-10-31 DIAGNOSIS — R2689 Other abnormalities of gait and mobility: Secondary | ICD-10-CM | POA: Diagnosis not present

## 2021-10-31 DIAGNOSIS — M25561 Pain in right knee: Secondary | ICD-10-CM | POA: Diagnosis not present

## 2021-10-31 DIAGNOSIS — M6281 Muscle weakness (generalized): Secondary | ICD-10-CM | POA: Diagnosis not present

## 2021-10-31 DIAGNOSIS — Z96651 Presence of right artificial knee joint: Secondary | ICD-10-CM | POA: Diagnosis not present

## 2021-11-02 ENCOUNTER — Ambulatory Visit: Payer: Medicare Other | Admitting: Family Medicine

## 2021-11-03 DIAGNOSIS — M25561 Pain in right knee: Secondary | ICD-10-CM | POA: Diagnosis not present

## 2021-11-03 DIAGNOSIS — Z96651 Presence of right artificial knee joint: Secondary | ICD-10-CM | POA: Diagnosis not present

## 2021-11-03 DIAGNOSIS — R2689 Other abnormalities of gait and mobility: Secondary | ICD-10-CM | POA: Diagnosis not present

## 2021-11-03 DIAGNOSIS — M6281 Muscle weakness (generalized): Secondary | ICD-10-CM | POA: Diagnosis not present

## 2021-11-07 DIAGNOSIS — R2689 Other abnormalities of gait and mobility: Secondary | ICD-10-CM | POA: Diagnosis not present

## 2021-11-07 DIAGNOSIS — Z96651 Presence of right artificial knee joint: Secondary | ICD-10-CM | POA: Diagnosis not present

## 2021-11-07 DIAGNOSIS — M6281 Muscle weakness (generalized): Secondary | ICD-10-CM | POA: Diagnosis not present

## 2021-11-07 DIAGNOSIS — M25561 Pain in right knee: Secondary | ICD-10-CM | POA: Diagnosis not present

## 2021-11-10 DIAGNOSIS — Z96651 Presence of right artificial knee joint: Secondary | ICD-10-CM | POA: Diagnosis not present

## 2021-11-10 DIAGNOSIS — R2689 Other abnormalities of gait and mobility: Secondary | ICD-10-CM | POA: Diagnosis not present

## 2021-11-10 DIAGNOSIS — M6281 Muscle weakness (generalized): Secondary | ICD-10-CM | POA: Diagnosis not present

## 2021-11-10 DIAGNOSIS — M25561 Pain in right knee: Secondary | ICD-10-CM | POA: Diagnosis not present

## 2021-11-13 DIAGNOSIS — M25561 Pain in right knee: Secondary | ICD-10-CM | POA: Diagnosis not present

## 2021-11-13 DIAGNOSIS — M6281 Muscle weakness (generalized): Secondary | ICD-10-CM | POA: Diagnosis not present

## 2021-11-13 DIAGNOSIS — Z96651 Presence of right artificial knee joint: Secondary | ICD-10-CM | POA: Diagnosis not present

## 2021-11-13 DIAGNOSIS — R2689 Other abnormalities of gait and mobility: Secondary | ICD-10-CM | POA: Diagnosis not present

## 2021-11-15 DIAGNOSIS — M25561 Pain in right knee: Secondary | ICD-10-CM | POA: Diagnosis not present

## 2021-11-15 DIAGNOSIS — R2689 Other abnormalities of gait and mobility: Secondary | ICD-10-CM | POA: Diagnosis not present

## 2021-11-15 DIAGNOSIS — Z96651 Presence of right artificial knee joint: Secondary | ICD-10-CM | POA: Diagnosis not present

## 2021-11-15 DIAGNOSIS — M6281 Muscle weakness (generalized): Secondary | ICD-10-CM | POA: Diagnosis not present

## 2021-11-16 ENCOUNTER — Ambulatory Visit (INDEPENDENT_AMBULATORY_CARE_PROVIDER_SITE_OTHER): Payer: Medicare Other | Admitting: Pharmacist

## 2021-11-16 DIAGNOSIS — M1710 Unilateral primary osteoarthritis, unspecified knee: Secondary | ICD-10-CM

## 2021-11-16 DIAGNOSIS — E785 Hyperlipidemia, unspecified: Secondary | ICD-10-CM

## 2021-11-16 NOTE — Progress Notes (Signed)
Chronic Care Management Pharmacy Note  11/16/2021 Name:  Lorraine Woods MRN:  [id number] DOB:  02/06/1946  Summary: addressed HLD (lifestyle modifications, no meds), and osteoarthritis, s/p knee replacement 3 weeks ago, healing & recovering.  Recommendations/Changes made from today's visit: none, reviewed options for pain management as well as OTC bowel regimen for constipation after brief opioid use + iron supplement.   Plan: f/u with pharmacist in 1 year  Subjective: Lorraine Woods is an 76 y.o. year old female who is a primary patient of Metheney, Rene Kocher, MD.  The CCM team was consulted for assistance with disease management and care coordination needs.    Engaged with patient by telephone for follow up visit in response to provider referral for pharmacy case management and/or care coordination services.   Consent to Services:  The patient was given information about Chronic Care Management services, agreed to services, and gave verbal consent prior to initiation of services.  Please see initial visit note for detailed documentation.   Patient Care Team: Hali Marry, MD as PCP - General (Family Medicine) Guss Bunde, MD as Consulting Physician (Obstetrics and Gynecology) Darius Bump, [facility] as Pharmacist (Pharmacist)  Recent office visits: 11/02/20: Dr Madilyn Fireman (PCP): migraine, anxiety f/u   Objective:  Lab Results  Component Value Date   CREATININE 0.74 10/24/2021   CREATININE 0.66 10/12/2021   CREATININE 0.74 09/19/2021    Lab Results  Component Value Date   HGBA1C 5.5 09/19/2021   Last diabetic Eye exam: No results found for: "HMDIABEYEEXA"  Last diabetic Foot exam: No results found for: "HMDIABFOOTEX"      Component Value Date/Time   CHOL 210 (H) 05/04/2021 0000   TRIG 119 05/04/2021 0000   HDL 60 05/04/2021 0000   CHOLHDL 3.5 05/04/2021 0000   VLDL 23 04/18/2016 0945   LDLCALC 127 (H) 05/04/2021 0000       Latest Ref Rng & Units 10/12/2021    11:00 AM 09/19/2021   12:00 AM 05/04/2021   12:00 AM  Hepatic Function  Total Protein 6.5 - 8.1 g/dL 7.2  6.8  7.0   Albumin 3.5 - 5.0 g/dL 4.1     AST 15 - 41 U/L $Remo'19  19  22   'cKKUd$ ALT 0 - 44 U/L $Remo'16  14  15   'CbLIE$ Alk Phosphatase 38 - 126 U/L 47     Total Bilirubin 0.3 - 1.2 mg/dL 0.6  0.5  0.6     Lab Results  Component Value Date/Time   TSH 1.19 11/02/2020 11:47 AM   TSH 2.70 04/29/2017 07:41 AM   FREET4 0.8 01/29/2013 12:00 AM       Latest Ref Rng & Units 10/24/2021    3:07 AM 10/12/2021   11:00 AM 09/19/2021   12:00 AM  CBC  WBC 4.0 - 10.5 K/uL 8.9  5.3  4.5   Hemoglobin 12.0 - 15.0 g/dL 11.2  12.5  11.2   Hematocrit 36.0 - 46.0 % 35.1  39.8  36.9   Platelets 150 - 400 K/uL 224  277  256     Lab Results  Component Value Date/Time   VD25OH 38 05/05/2018 09:30 AM   VD25OH 41 04/29/2017 07:41 AM    Clinical ASCVD: No  The 10-year ASCVD risk score (Arnett DK, et al., 2019) is: 15%   Values used to calculate the score:     Age: 46 years     Sex: Female     Is Non-Hispanic African American:  No     Diabetic: No     Tobacco smoker: No     Systolic Blood Pressure: 892 mmHg     Is BP treated: No     HDL Cholesterol: 60 mg/dL     Total Cholesterol: 210 mg/dL     Social History   Tobacco Use  Smoking Status Never  Smokeless Tobacco Never   BP Readings from Last 3 Encounters:  10/24/21 123/69  10/12/21 122/66  09/19/21 116/64   Pulse Readings from Last 3 Encounters:  10/24/21 (!) 58  10/12/21 70  09/19/21 70   Wt Readings from Last 3 Encounters:  10/23/21 187 lb (84.8 kg)  10/12/21 187 lb (84.8 kg)  09/19/21 192 lb (87.1 kg)    Assessment: Review of patient past medical history, allergies, medications, health status, including review of consultants reports, laboratory and other test data, was performed as part of comprehensive evaluation and provision of chronic care management services.   SDOH:  (Social Determinants of Health) assessments and interventions  performed:    CCM Care Plan  Allergies  Allergen Reactions   Aspirin Other (See Comments)    Bruising.    Excedrin Migraine [Aspirin-Acetaminophen-Caffeine] Other (See Comments)    Bruising     Medications Reviewed Today     Reviewed by Darius Bump, [facility] (Pharmacist) on 11/16/21 at 1021  Med List Status: <None>   Medication Order Taking? Sig Documenting Provider Last Dose Status Informant  Calcium Carb-Cholecalciferol (CALTRATE 600+D3 PO) [id number] Yes Take 1 tablet by mouth daily. [provider] Taking Active Self  cholecalciferol (VITAMIN D3) 25 MCG (1000 UNIT) tablet [id number] Yes Take 1,000 Units by mouth daily. [provider] Taking Active Self  ferrous sulfate 325 (65 FE) MG tablet [id number] No Take 325 mg by mouth daily with breakfast.  Patient not taking: Reported on 11/16/2021   [provider] Not Taking Active   gabapentin (NEURONTIN) 300 MG capsule [id number] Yes Take a 300 mg capsule three times a day for two weeks following surgery.Then take a 300 mg capsule two times a day for two weeks. Then take a 300 mg capsule once a day for two weeks. Then discontinue. Edmisten, Kristie L, PA Taking Active   ipratropium (ATROVENT) 0.03 % nasal spray [id number] Yes SPRAY 2 SPRAYS INTO BOTH NOSTRILS EVERY 12 HOURS  Patient taking differently: Place 2 sprays into both nostrils daily.   Hali Marry, MD Taking Active Self  methocarbamol (ROBAXIN) 500 MG tablet [id number] No Take 1 tablet (500 mg total) by mouth every 6 (six) hours as needed for muscle spasms.  Patient not taking: Reported on 11/16/2021   Derl Barrow, [place] Not Taking Active   minoxidil (ROGAINE) 2 % external solution [id number] Yes Apply 1 application. topically 3 (three) times a week. [provider] Taking Active Self  Multiple Vitamin (MULTIVITAMIN WITH MINERALS) TABS tablet [id number] Yes Take 1 tablet by mouth every morning. [provider] Taking Active  Self  omeprazole (PRILOSEC) 20 MG capsule [id number] Yes TAKE 1 CAPSULE BY MOUTH EVERY DAY Breeback, Jade L, PA-C Taking Active   sertraline (ZOLOFT) 100 MG tablet [id number] Yes TAKE 1 TABLET BY MOUTH EVERY DAY IN THE MORNING Breeback, Jade L, PA-C Taking Active   SUMAtriptan (IMITREX) 100 MG tablet [id number] Yes TAKE 1 TABLET BY MOUTH EVERY 2HRS AS NEEDED FOR MIGRAINE. MAY REPEAT IN 2HRS IF HEADACHE PERSISTS Hali Marry, MD Taking Active Self  Patient Active Problem List   Diagnosis Date Noted   OA (osteoarthritis) of knee 10/23/2021   Primary osteoarthritis of right knee 10/23/2021   GERD (gastroesophageal reflux disease) 05/04/2021   Tonsil stone 10/26/2019   Hearing loss of aging 10/26/2019   Hyperlipidemia 04/28/2019   Chondromalacia of left patellofemoral joint 08/25/2015   Osteopenia 06/30/2014   Solitary pulmonary nodule 08/04/2013   Social anxiety disorder 03/03/2013   Migraine without aura, not refractory 03/03/2013   Vitamin D deficiency 03/03/2013   Alopecia of scalp 03/03/2013   History of basal cell carcinoma of skin 03/03/2013   Liver cyst 03/03/2013   Varicose veins 02/26/2011   Tinnitus 02/24/2011   Generalized anxiety disorder 02/24/2011   Benign neoplasm of breast 02/24/2011    Immunization History  Administered Date(s) Administered   Fluad Quad(high Dose 65+) 02/11/2019, 03/14/2021   Influenza, High Dose Seasonal PF 03/12/2016, 03/07/2017, 03/06/2020   Influenza, Seasonal, Injecte, Preservative Fre 03/05/2014   Influenza,inj,Quad PF,6+ Mos 03/10/2018   Influenza-Unspecified 03/04/2013, 03/05/2015, 03/14/2016   PFIZER Comirnaty(Gray Top)Covid-19 Tri-Sucrose Vaccine 10/11/2020   PFIZER(Purple Top)SARS-COV-2 Vaccination 07/06/2019, 07/27/2019, 04/06/2020   Pfizer Covid-19 Vaccine Bivalent Booster 19yrs & up 03/31/2021   Pneumococcal Conjugate-13 12/29/2013   Pneumococcal Polysaccharide-23 06/04/2005, 10/20/2015   Tdap 03/15/2009,  04/02/2020   Zoster, Live 01/03/2012, 01/30/2012    Conditions to be addressed/monitored: HLD and Anxiety  Care Plan : Medication Management  Updates made by Darius Bump, [place] since 11/16/2021 12:00 AM     Problem: HLD, anxiety      Long-Range Goal: Disease Progression Prevention   Note:   Current Barriers:  None at present  Pharmacist Clinical Goal(s):  Over the next 365 days, patient will maintain control of chronic conditions as evidenced by medication fill history, lab values, and vitals  through collaboration with PharmD and provider.   Interventions: 1:1 collaboration with Hali Marry, MD regarding development and update of comprehensive plan of care as evidenced by provider attestation and co-signature Inter-disciplinary care team collaboration (see longitudinal plan of care) Comprehensive medication review performed; medication list updated in electronic medical record  Hyperlipidemia:  Uncontrolled; current treatment:lifestyle modifications only; LDL 134 ascvd 12.8%  Previously educated on LDL, ASCVD number, and guidance that age 89-76yr old with no hx diabetes recommends lifestyle modifications + moderate intensity statin, patient opted for conservative management w/lifestyle modification only Recommended continue current regimen  Anxiety:  Controlled; current treatment:Sertraline 100mg ;   Patient reports a few recent stressors, but overall pleased with her current medication effectiveness  Connected with no one for mental health support, pt respectfully declined, but I advised we can connect her if she requests in the future  Recommended continue current regimen  Patient Goals/Self-Care Activities Over the next 365 days, patient will:  take medications as prescribed   Follow Up Plan: Telephone follow up appointment with care management team member scheduled for:  1 year      Medication Assistance: None required.  Patient affirms current coverage  meets needs.  Patient's preferred pharmacy is:  [id number] - [place], [place] [address] Phone: [phone] Fax: [phone]    Follow Up:  Patient agrees to Care Plan and Follow-up.  Plan: Telephone follow up appointment with care management team member scheduled for:  1 year  Darius Bump

## 2021-11-16 NOTE — Patient Instructions (Signed)
Visit Information  Thank you for taking time to visit with me today. Please don't hesitate to contact me if I can be of assistance to you before our next scheduled telephone appointment.  Following are the goals we discussed today:   Patient Goals/Self-Care Activities Over the next 365 days, patient will:  take medications as prescribed  Follow Up Plan: Telephone follow up appointment with care management team member scheduled for:  1 year  Please call the care guide team at [phone] if you need to cancel or reschedule your appointment.    Patient verbalizes understanding of instructions and care plan provided today and agrees to view in MyChart. Active MyChart status and patient understanding of how to access instructions and care plan via MyChart confirmed with patient.     Nickia Boesen J Magdelyn Roebuck  

## 2021-11-21 DIAGNOSIS — Z96651 Presence of right artificial knee joint: Secondary | ICD-10-CM | POA: Diagnosis not present

## 2021-11-21 DIAGNOSIS — M6281 Muscle weakness (generalized): Secondary | ICD-10-CM | POA: Diagnosis not present

## 2021-11-21 DIAGNOSIS — R2689 Other abnormalities of gait and mobility: Secondary | ICD-10-CM | POA: Diagnosis not present

## 2021-11-21 DIAGNOSIS — M25561 Pain in right knee: Secondary | ICD-10-CM | POA: Diagnosis not present

## 2021-11-24 DIAGNOSIS — M25561 Pain in right knee: Secondary | ICD-10-CM | POA: Diagnosis not present

## 2021-11-24 DIAGNOSIS — Z96651 Presence of right artificial knee joint: Secondary | ICD-10-CM | POA: Diagnosis not present

## 2021-11-24 DIAGNOSIS — R2689 Other abnormalities of gait and mobility: Secondary | ICD-10-CM | POA: Diagnosis not present

## 2021-11-24 DIAGNOSIS — M6281 Muscle weakness (generalized): Secondary | ICD-10-CM | POA: Diagnosis not present

## 2021-11-28 DIAGNOSIS — M25561 Pain in right knee: Secondary | ICD-10-CM | POA: Diagnosis not present

## 2021-11-28 DIAGNOSIS — M6281 Muscle weakness (generalized): Secondary | ICD-10-CM | POA: Diagnosis not present

## 2021-11-28 DIAGNOSIS — Z96651 Presence of right artificial knee joint: Secondary | ICD-10-CM | POA: Diagnosis not present

## 2021-11-28 DIAGNOSIS — R2689 Other abnormalities of gait and mobility: Secondary | ICD-10-CM | POA: Diagnosis not present

## 2021-11-29 DIAGNOSIS — Z5189 Encounter for other specified aftercare: Secondary | ICD-10-CM | POA: Diagnosis not present

## 2021-11-30 ENCOUNTER — Encounter: Payer: Self-pay | Admitting: Family Medicine

## 2021-11-30 ENCOUNTER — Ambulatory Visit (INDEPENDENT_AMBULATORY_CARE_PROVIDER_SITE_OTHER): Payer: Medicare Other | Admitting: Family Medicine

## 2021-11-30 VITALS — BP 121/55 | HR 76 | Resp 16 | Ht 70.0 in | Wt 188.0 lb

## 2021-11-30 DIAGNOSIS — R519 Headache, unspecified: Secondary | ICD-10-CM

## 2021-11-30 DIAGNOSIS — E611 Iron deficiency: Secondary | ICD-10-CM | POA: Diagnosis not present

## 2021-11-30 NOTE — Assessment & Plan Note (Signed)
She did try taking the iron supplement for about a month but ended up to stopping because of side effects.  She would like to recheck it to see if her levels are better before attempting to take another iron supplement we could consider something prescription like fusion plus if her insurance will cover it.  Or even a plant-based iron if needed.  Team to work on iron rich food intake.

## 2021-11-30 NOTE — Progress Notes (Signed)
Established Patient Office Visit  Subjective   Patient ID: Lorraine Woods, female    DOB: November 24, 1945  Age: 76 y.o. MRN: [id number]  Chief Complaint  Patient presents with   Follow-up    6 month    Discuss Iron Levels    Patient states she is unable to take the iron tablets. Patient states she feels bloated and constipated on the iron tablets.    Head Concern    Patient states she has been having sharp pains on the top of her head off and on for 2 months     HPI   She reports that for the last couple months she has had some intermittent sharp pains in the top of her head this very different from her migraines that usually just last for a few seconds.  They are not associated with runny nose or other symptoms such as vision change etc.  She is not sure what may be causing it is just different.  She also reports that she was not able to take the iron supplement.  She said she did take it for a month but it caused significant bloating and changed her bowel movements.  It was causing about 6 small stools a day.  Discontinued it.  She said after having her knee replacement it was very difficult to get up and off the toilet multiple times a day.  She is doing well with her right knee replacement she is [facility] and making some good progress she is excited that she can drive now.     ROS    Objective:     BP (!) 121/55   Pulse 76   Resp 16   Ht '5\' 10"'$  (1.778 m)   Wt 188 lb (85.3 kg)   SpO2 93%   BMI 26.98 kg/m    Physical Exam Vitals and nursing note reviewed.  Constitutional:      Appearance: She is well-developed.  HENT:     Head: Normocephalic and atraumatic.  Cardiovascular:     Rate and Rhythm: Normal rate and regular rhythm.     Heart sounds: Normal heart sounds.  Pulmonary:     Effort: Pulmonary effort is normal.     Breath sounds: Normal breath sounds.  Skin:    General: Skin is warm and dry.  Neurological:     Mental Status:  She is alert and oriented to person, place, and time.  Psychiatric:        Behavior: Behavior normal.      No results found for any visits on 11/30/21.    The 10-year ASCVD risk score (Arnett DK, et al., 2019) is: 14.6%    Assessment & Plan:   Problem List Items Addressed This Visit       Other   Iron deficiency    She did try taking the iron supplement for about a month but ended up to stopping because of side effects.  She would like to recheck it to see if her levels are better before attempting to take another iron supplement we could consider something prescription like fusion plus if her insurance will cover it.  Or even a plant-based iron if needed.  Team to work on iron rich food intake.      Relevant Orders   Fe+TIBC+Fer   Other Visit Diagnoses     Nonintractable episodic headache, unspecified headache type    -  Primary   Hypocalcemia  Relevant Orders   Calcium       Hypocalcemia noted on labs while she was in the hospital for her knee replacement so we will recheck that today.  Intermittent sharp headaches on the top of the head that just last a few seconds.  Unclear etiology for now we will keep an eye on those if she notices any new or worsening symptoms or prolonged symptoms then please let us know.  Return in about 6 months (around 06/01/2022) for medications and labs.    [name], MD

## 2021-12-01 ENCOUNTER — Encounter: Payer: Self-pay | Admitting: Family Medicine

## 2021-12-01 DIAGNOSIS — Z96651 Presence of right artificial knee joint: Secondary | ICD-10-CM | POA: Diagnosis not present

## 2021-12-01 DIAGNOSIS — M1710 Unilateral primary osteoarthritis, unspecified knee: Secondary | ICD-10-CM

## 2021-12-01 DIAGNOSIS — M25561 Pain in right knee: Secondary | ICD-10-CM | POA: Diagnosis not present

## 2021-12-01 DIAGNOSIS — M6281 Muscle weakness (generalized): Secondary | ICD-10-CM | POA: Diagnosis not present

## 2021-12-01 DIAGNOSIS — E785 Hyperlipidemia, unspecified: Secondary | ICD-10-CM

## 2021-12-01 DIAGNOSIS — R2689 Other abnormalities of gait and mobility: Secondary | ICD-10-CM | POA: Diagnosis not present

## 2021-12-01 LAB — IRON,TIBC AND FERRITIN PANEL
%SAT: 13 % (calc) — ABNORMAL LOW (ref 16–45)
Ferritin: 21 ng/mL (ref 16–288)
Iron: 45 ug/dL (ref 45–160)
TIBC: 352 mcg/dL (calc) (ref 250–450)

## 2021-12-01 LAB — CALCIUM: Calcium: 9.5 mg/dL (ref 8.6–10.4)

## 2021-12-01 MED ORDER — FUSION PLUS PO CAPS: 1.0000 | ORAL_CAPSULE | Freq: Every day | ORAL | 3 refills | Status: DC

## 2021-12-01 NOTE — Progress Notes (Signed)
Hi Lorraine Woods, total iron and ferritin which is iron stores are definitely on the low end of normal.  I will send over the prescription called fusion plus to the pharmacy for iron supplementation we will see if they will cover it.  If they do not cover it then please let us know.  Repeat calcium was normal.

## 2021-12-01 NOTE — Addendum Note (Signed)
Addended by: [name] D on: 12/01/2021 08:21 AM   Modules accepted: Orders

## 2021-12-04 DIAGNOSIS — M6281 Muscle weakness (generalized): Secondary | ICD-10-CM | POA: Diagnosis not present

## 2021-12-04 DIAGNOSIS — R2689 Other abnormalities of gait and mobility: Secondary | ICD-10-CM | POA: Diagnosis not present

## 2021-12-04 DIAGNOSIS — Z96651 Presence of right artificial knee joint: Secondary | ICD-10-CM | POA: Diagnosis not present

## 2021-12-04 DIAGNOSIS — M25561 Pain in right knee: Secondary | ICD-10-CM | POA: Diagnosis not present

## 2021-12-06 DIAGNOSIS — M6281 Muscle weakness (generalized): Secondary | ICD-10-CM | POA: Diagnosis not present

## 2021-12-06 DIAGNOSIS — R2689 Other abnormalities of gait and mobility: Secondary | ICD-10-CM | POA: Diagnosis not present

## 2021-12-06 DIAGNOSIS — M25561 Pain in right knee: Secondary | ICD-10-CM | POA: Diagnosis not present

## 2021-12-06 DIAGNOSIS — Z96651 Presence of right artificial knee joint: Secondary | ICD-10-CM | POA: Diagnosis not present

## 2021-12-12 DIAGNOSIS — M6281 Muscle weakness (generalized): Secondary | ICD-10-CM | POA: Diagnosis not present

## 2021-12-12 DIAGNOSIS — M25561 Pain in right knee: Secondary | ICD-10-CM | POA: Diagnosis not present

## 2021-12-12 DIAGNOSIS — R2689 Other abnormalities of gait and mobility: Secondary | ICD-10-CM | POA: Diagnosis not present

## 2021-12-12 DIAGNOSIS — Z96651 Presence of right artificial knee joint: Secondary | ICD-10-CM | POA: Diagnosis not present

## 2021-12-15 DIAGNOSIS — R2689 Other abnormalities of gait and mobility: Secondary | ICD-10-CM | POA: Diagnosis not present

## 2021-12-15 DIAGNOSIS — M25561 Pain in right knee: Secondary | ICD-10-CM | POA: Diagnosis not present

## 2021-12-15 DIAGNOSIS — M6281 Muscle weakness (generalized): Secondary | ICD-10-CM | POA: Diagnosis not present

## 2021-12-15 DIAGNOSIS — Z96651 Presence of right artificial knee joint: Secondary | ICD-10-CM | POA: Diagnosis not present

## 2021-12-18 DIAGNOSIS — Z96651 Presence of right artificial knee joint: Secondary | ICD-10-CM | POA: Diagnosis not present

## 2021-12-18 DIAGNOSIS — M6281 Muscle weakness (generalized): Secondary | ICD-10-CM | POA: Diagnosis not present

## 2021-12-18 DIAGNOSIS — R2689 Other abnormalities of gait and mobility: Secondary | ICD-10-CM | POA: Diagnosis not present

## 2021-12-18 DIAGNOSIS — M25561 Pain in right knee: Secondary | ICD-10-CM | POA: Diagnosis not present

## 2021-12-19 DIAGNOSIS — D485 Neoplasm of uncertain behavior of skin: Secondary | ICD-10-CM | POA: Diagnosis not present

## 2021-12-19 DIAGNOSIS — Z08 Encounter for follow-up examination after completed treatment for malignant neoplasm: Secondary | ICD-10-CM | POA: Diagnosis not present

## 2021-12-19 DIAGNOSIS — D1801 Hemangioma of skin and subcutaneous tissue: Secondary | ICD-10-CM | POA: Diagnosis not present

## 2021-12-19 DIAGNOSIS — Z85828 Personal history of other malignant neoplasm of skin: Secondary | ICD-10-CM | POA: Diagnosis not present

## 2021-12-19 DIAGNOSIS — L814 Other melanin hyperpigmentation: Secondary | ICD-10-CM | POA: Diagnosis not present

## 2021-12-19 DIAGNOSIS — L821 Other seborrheic keratosis: Secondary | ICD-10-CM | POA: Diagnosis not present

## 2021-12-19 DIAGNOSIS — C441122 Basal cell carcinoma of skin of right lower eyelid, including canthus: Secondary | ICD-10-CM | POA: Diagnosis not present

## 2021-12-19 DIAGNOSIS — L720 Epidermal cyst: Secondary | ICD-10-CM | POA: Diagnosis not present

## 2021-12-22 DIAGNOSIS — M25561 Pain in right knee: Secondary | ICD-10-CM | POA: Diagnosis not present

## 2021-12-22 DIAGNOSIS — Z96651 Presence of right artificial knee joint: Secondary | ICD-10-CM | POA: Diagnosis not present

## 2021-12-22 DIAGNOSIS — R2689 Other abnormalities of gait and mobility: Secondary | ICD-10-CM | POA: Diagnosis not present

## 2021-12-22 DIAGNOSIS — M6281 Muscle weakness (generalized): Secondary | ICD-10-CM | POA: Diagnosis not present

## 2021-12-27 DIAGNOSIS — Z96651 Presence of right artificial knee joint: Secondary | ICD-10-CM | POA: Diagnosis not present

## 2021-12-27 DIAGNOSIS — M25561 Pain in right knee: Secondary | ICD-10-CM | POA: Diagnosis not present

## 2021-12-27 DIAGNOSIS — M6281 Muscle weakness (generalized): Secondary | ICD-10-CM | POA: Diagnosis not present

## 2021-12-27 DIAGNOSIS — R2689 Other abnormalities of gait and mobility: Secondary | ICD-10-CM | POA: Diagnosis not present

## 2021-12-29 DIAGNOSIS — M25561 Pain in right knee: Secondary | ICD-10-CM | POA: Diagnosis not present

## 2021-12-29 DIAGNOSIS — Z96651 Presence of right artificial knee joint: Secondary | ICD-10-CM | POA: Diagnosis not present

## 2021-12-29 DIAGNOSIS — M6281 Muscle weakness (generalized): Secondary | ICD-10-CM | POA: Diagnosis not present

## 2021-12-29 DIAGNOSIS — R2689 Other abnormalities of gait and mobility: Secondary | ICD-10-CM | POA: Diagnosis not present

## 2022-01-16 DIAGNOSIS — Z5189 Encounter for other specified aftercare: Secondary | ICD-10-CM | POA: Diagnosis not present

## 2022-03-05 ENCOUNTER — Encounter: Payer: Self-pay | Admitting: Family Medicine

## 2022-03-06 NOTE — Telephone Encounter (Signed)
Have her try taking one every other day and see if that is better. Or even reduce to 3 times a week and see if that helps.  OK to take with food too. That could help.

## 2022-03-27 DIAGNOSIS — C441122 Basal cell carcinoma of skin of right lower eyelid, including canthus: Secondary | ICD-10-CM | POA: Diagnosis not present

## 2022-04-06 DIAGNOSIS — Z23 Encounter for immunization: Secondary | ICD-10-CM | POA: Diagnosis not present

## 2022-04-20 ENCOUNTER — Other Ambulatory Visit: Payer: Self-pay | Admitting: Physician Assistant

## 2022-04-20 DIAGNOSIS — F411 Generalized anxiety disorder: Secondary | ICD-10-CM

## 2022-04-30 DIAGNOSIS — Z23 Encounter for immunization: Secondary | ICD-10-CM | POA: Diagnosis not present

## 2022-05-20 ENCOUNTER — Other Ambulatory Visit: Payer: Self-pay | Admitting: Family Medicine

## 2022-05-20 DIAGNOSIS — G43009 Migraine without aura, not intractable, without status migrainosus: Secondary | ICD-10-CM

## 2022-06-01 ENCOUNTER — Ambulatory Visit (INDEPENDENT_AMBULATORY_CARE_PROVIDER_SITE_OTHER): Payer: Medicare Other | Admitting: Family Medicine

## 2022-06-01 ENCOUNTER — Encounter: Payer: Self-pay | Admitting: Family Medicine

## 2022-06-01 VITALS — BP 128/53 | HR 75 | Ht 70.0 in | Wt 192.0 lb

## 2022-06-01 DIAGNOSIS — G43009 Migraine without aura, not intractable, without status migrainosus: Secondary | ICD-10-CM | POA: Diagnosis not present

## 2022-06-01 DIAGNOSIS — R790 Abnormal level of blood mineral: Secondary | ICD-10-CM | POA: Diagnosis not present

## 2022-06-01 DIAGNOSIS — E611 Iron deficiency: Secondary | ICD-10-CM | POA: Diagnosis not present

## 2022-06-01 DIAGNOSIS — F411 Generalized anxiety disorder: Secondary | ICD-10-CM

## 2022-06-01 DIAGNOSIS — K2100 Gastro-esophageal reflux disease with esophagitis, without bleeding: Secondary | ICD-10-CM | POA: Diagnosis not present

## 2022-06-01 DIAGNOSIS — Z79899 Other long term (current) drug therapy: Secondary | ICD-10-CM

## 2022-06-01 DIAGNOSIS — E785 Hyperlipidemia, unspecified: Secondary | ICD-10-CM | POA: Diagnosis not present

## 2022-06-01 MED ORDER — OMEPRAZOLE 20 MG PO CPDR: DELAYED_RELEASE_CAPSULE | ORAL | 1 refills | Status: DC

## 2022-06-01 MED ORDER — IPRATROPIUM BROMIDE 0.03 % NA SOLN: 2.0000 | Freq: Two times a day (BID) | NASAL | 11 refills | Status: DC

## 2022-06-01 MED ORDER — SERTRALINE HCL 100 MG PO TABS: ORAL_TABLET | ORAL | 1 refills | Status: DC

## 2022-06-01 MED ORDER — SUMATRIPTAN SUCCINATE 100 MG PO TABS: ORAL_TABLET | ORAL | 6 refills | Status: DC

## 2022-06-01 NOTE — Assessment & Plan Note (Signed)
Due to recheck lipids today.

## 2022-06-01 NOTE — Assessment & Plan Note (Signed)
Refilled omeprazole today.

## 2022-06-01 NOTE — Assessment & Plan Note (Signed)
Due to recheck iron.  She has been taking her supplement every other day because of GI issues.  When she takes it daily she actually gets diarrhea.

## 2022-06-01 NOTE — Assessment & Plan Note (Signed)
Stable on PRN regimen.

## 2022-06-01 NOTE — Progress Notes (Signed)
Established Patient Office Visit  Subjective   Patient ID: Lorraine Woods, female    DOB: 05/01/1946  Age: 76 y.o. MRN: [id number]  Chief Complaint  Patient presents with   Follow-up         HPI  Follow-up generalized disorder-currently on sertraline.  Happy with her current regimen.  PHQ-9 score 4 and GAD-7 score of 3 today.  Follow-up migraine headaches-currently using Imitrex as needed.  She states she was doing really well up until last month.  Last month she had about 4-5 migraines but this months she has done well.  She had a lot of stress with her husband who unfortunately is losing his vision.  Hyperlipidemia - tolerating stating well with no myalgias or significant side effects.  Lab Results  Component Value Date   CHOL 210 (H) 05/04/2021   HDL 60 05/04/2021   LDLCALC 127 (H) 05/04/2021   TRIG 119 05/04/2021   CHOLHDL 3.5 05/04/2021   She is also had a chronic runny nose and uses Atrovent nasal spray as needed.  More recently she has developed just an intermittent dry little cough.  She does not feel ill she is not getting up any mucus no fevers chills or sweats and no recent exposures.  She also wanted to let me know that she had knee replacement surgery and she also had a basal cell skin cancer removed underneath her right eye.     ROS    Objective:     BP (!) 128/53   Pulse 75   Ht '5\' 10"'$  (1.778 m)   Wt 192 lb (87.1 kg)   SpO2 96%   BMI 27.55 kg/m    Physical Exam Vitals and nursing note reviewed.  Constitutional:      Appearance: She is well-developed.  HENT:     Head: Normocephalic and atraumatic.  Cardiovascular:     Rate and Rhythm: Normal rate and regular rhythm.     Heart sounds: Normal heart sounds.  Pulmonary:     Effort: Pulmonary effort is normal.     Breath sounds: Normal breath sounds.  Skin:    General: Skin is warm and dry.  Neurological:     Mental Status: She is alert and oriented to person, place, and time.  Psychiatric:         Behavior: Behavior normal.      No results found for any visits on 06/01/22.    The 10-year ASCVD risk score (Arnett DK, et al., 2019) is: 16.1%    Assessment & Plan:   Problem List Items Addressed This Visit       Cardiovascular and Mediastinum   Migraine without aura, not refractory    Stable on PRN regimen.       Relevant Medications   SUMAtriptan (IMITREX) 100 MG tablet   sertraline (ZOLOFT) 100 MG tablet     Digestive   GERD (gastroesophageal reflux disease)    Refilled omeprazole today.      Relevant Medications   omeprazole (PRILOSEC) 20 MG capsule     Other   Iron deficiency    Due to recheck iron.  She has been taking her supplement every other day because of GI issues.  When she takes it daily she actually gets diarrhea.      Hyperlipidemia - Primary    Due to recheck lipids today.      Relevant Orders   Lipid Panel w/reflex Direct LDL   Generalized anxiety disorder   Relevant Medications  sertraline (ZOLOFT) 100 MG tablet   Other Visit Diagnoses     Low ferritin       Relevant Orders   Fe+TIBC+Fer   Medication management       Relevant Orders   Lipid Panel w/reflex Direct LDL   Fe+TIBC+Fer   COMPLETE METABOLIC PANEL WITH GFR       Return in about 6 months (around 12/01/2022) for Mood and MIgraines.    [name], MD

## 2022-06-02 LAB — COMPLETE METABOLIC PANEL WITH GFR
AG Ratio: 1.4 (calc) (ref 1.0–2.5)
ALT: 13 U/L (ref 6–29)
AST: 19 U/L (ref 10–35)
Albumin: 4.2 g/dL (ref 3.6–5.1)
Alkaline phosphatase (APISO): 42 U/L (ref 37–153)
BUN: 21 mg/dL (ref 7–25)
CO2: 28 mmol/L (ref 20–32)
Calcium: 9.5 mg/dL (ref 8.6–10.4)
Chloride: 106 mmol/L (ref 98–110)
Creat: 0.65 mg/dL (ref 0.60–1.00)
Globulin: 2.9 g/dL (calc) (ref 1.9–3.7)
Glucose, Bld: 96 mg/dL (ref 65–99)
Potassium: 4.3 mmol/L (ref 3.5–5.3)
Sodium: 141 mmol/L (ref 135–146)
Total Bilirubin: 0.6 mg/dL (ref 0.2–1.2)
Total Protein: 7.1 g/dL (ref 6.1–8.1)
eGFR: 92 mL/min/{1.73_m2} (ref >=60)

## 2022-06-02 LAB — IRON,TIBC AND FERRITIN PANEL
%SAT: 44 % (calc) (ref 16–45)
Ferritin: 25 ng/mL (ref 16–288)
Iron: 137 ug/dL (ref 45–160)
TIBC: 314 mcg/dL (calc) (ref 250–450)

## 2022-06-02 LAB — LIPID PANEL W/REFLEX DIRECT LDL
Cholesterol: 205 mg/dL — ABNORMAL HIGH (ref <200)
HDL: 58 mg/dL (ref >=50)
LDL Cholesterol (Calc): 120 mg/dL (calc) — ABNORMAL HIGH
Non-HDL Cholesterol (Calc): 147 mg/dL (calc) — ABNORMAL HIGH (ref <130)
Total CHOL/HDL Ratio: 3.5 (calc) (ref <5.0)
Triglycerides: 156 mg/dL — ABNORMAL HIGH (ref <150)

## 2022-06-05 NOTE — Progress Notes (Signed)
Hi Lorraine Woods, LDL cholesterol is a little better.  It has been trending down.  2 years ago it was 134, then last year 127, and this year 120.  She is encouraged to continue to work on healthy diet and regular exercise.  Iron level is slowly improving.  Continue your extra iron supplementation.  Your metabolic panel looks great.

## 2022-06-18 ENCOUNTER — Encounter: Payer: Self-pay | Admitting: Gastroenterology

## 2022-07-13 ENCOUNTER — Ambulatory Visit (INDEPENDENT_AMBULATORY_CARE_PROVIDER_SITE_OTHER): Payer: Medicare Other | Admitting: Nurse Practitioner

## 2022-07-13 ENCOUNTER — Encounter: Payer: Self-pay | Admitting: Nurse Practitioner

## 2022-07-13 ENCOUNTER — Other Ambulatory Visit (INDEPENDENT_AMBULATORY_CARE_PROVIDER_SITE_OTHER): Payer: Medicare Other

## 2022-07-13 VITALS — BP 114/70 | HR 72 | Ht 69.25 in | Wt 191.1 lb

## 2022-07-13 DIAGNOSIS — K2100 Gastro-esophageal reflux disease with esophagitis, without bleeding: Secondary | ICD-10-CM | POA: Diagnosis not present

## 2022-07-13 DIAGNOSIS — Z86010 Personal history of colonic polyps: Secondary | ICD-10-CM

## 2022-07-13 DIAGNOSIS — D509 Iron deficiency anemia, unspecified: Secondary | ICD-10-CM

## 2022-07-13 LAB — CBC
HCT: 42.5 % (ref 36.0–46.0)
Hemoglobin: 14.3 g/dL (ref 12.0–15.0)
MCHC: 33.7 g/dL (ref 30.0–36.0)
MCV: 86.3 fl (ref 78.0–100.0)
Platelets: 223 10*3/uL (ref 150.0–400.0)
RBC: 4.92 Mil/uL (ref 3.87–5.11)
RDW: 13.3 % (ref 11.5–15.5)
WBC: 4.7 10*3/uL (ref 4.0–10.5)

## 2022-07-13 NOTE — Patient Instructions (Addendum)
Your provider has requested that you go to the basement level for lab work before leaving today. Press "B" on the elevator. The lab is located at the first door on the left as you exit the elevator.  We will contact you once our schedule for May opens to schedule a colonoscopy.  Due to recent changes in healthcare laws, you may see the results of your imaging and laboratory studies on MyChart before your provider has had a chance to review them.  We understand that in some cases there may be results that are confusing or concerning to you. Not all laboratory results come back in the same time frame and the provider may be waiting for multiple results in order to interpret others.  Please give [place] 48 hours in order for your provider to thoroughly review all the results before contacting the office for clarification of your results.    Thank you for trusting me with your gastrointestinal care!   Carl Best, CRNP

## 2022-07-13 NOTE — Progress Notes (Unsigned)
07/13/2022 Lorraine Woods DI:[id number] [birth year]   CHIEF COMPLAINT:   HISTORY OF PRESENT ILLNESS:  Right knee replacement surgery 10/23/2021  Colonoscopy   Right knee total replacment surgery May 2023. Anemia.  She donates blood 3 to 4 times year.  No donations since knee surgery.  She is not taking iron, fusion plus has iron and probiotic started sometime last year. Diarrhea, on fusion plus, PCP instructed to take with food QOD.   Right chest pain, EKG 1 yr ago, took Prilosec pain went away. She is taking Prilsec QOD.   No heartburn or dysphagia.  Once in while food gets in throat past Right throat/tonil with ridges get stuck. Saw ENT She normal brown stools no bleed or black stools          Latest Ref Rng & Units 10/24/2021    3:07 AM 10/12/2021   11:00 AM 09/19/2021   12:00 AM  CBC  WBC 4.0 - 10.5 K/uL 8.9  5.3  4.5   Hemoglobin 12.0 - 15.0 g/dL 11.2  12.5  11.2   Hematocrit 36.0 - 46.0 % 35.1  39.8  36.9   Platelets 150 - 400 K/uL 224  277  256        Latest Ref Rng & Units 06/01/2022    1:59 PM 11/30/2021   12:00 AM 10/24/2021    3:07 AM  CMP  Glucose 65 - 99 mg/dL 96   146   BUN 7 - 25 mg/dL 21   15   Creatinine 0.60 - 1.00 mg/dL 0.65   0.74   Sodium 135 - 146 mmol/L 141   141   Potassium 3.5 - 5.3 mmol/L 4.3   4.4   Chloride 98 - 110 mmol/L 106   109   CO2 20 - 32 mmol/L 28   26   Calcium 8.6 - 10.4 mg/dL 9.5  9.5  8.8   Total Protein 6.1 - 8.1 g/dL 7.1     Total Bilirubin 0.2 - 1.2 mg/dL 0.6     AST 10 - 35 U/L 19     ALT 6 - 29 U/L 13            Past Medical History:  Diagnosis Date   Arthritis    Basal cell carcinoma    BASAL CELL ON NOSE   Depression    GAD (generalized anxiety disorder)    GERD (gastroesophageal reflux disease)    History of basal cell carcinoma (BCC) excision    nose  x2  last one 2019   Hyperlipidemia    Hyperplastic colon polyp    Knee pain, chronic    Migraine    Osteopenia    PMB (postmenopausal bleeding)     Seasonal allergies    Thickened endometrium    Wears glasses    Past Surgical History:  Procedure Laterality Date   APPENDECTOMY  1959   age 49   BREAST LUMPECTOMY Right 2002   on HRT, fibromadenoma   CHOLECYSTECTOMY N/A 09/17/2013   Procedure: LAPAROSCOPIC CHOLECYSTECTOMY;  Surgeon: Stark Klein, MD;  Location: [facility];  Service: General;  Laterality: N/A;   COLONOSCOPY WITH PROPOFOL  last one 06-28-2017  dr Havery Moros   DILATATION & CURETTAGE/HYSTEROSCOPY WITH MYOSURE N/A 06/15/2020   Procedure: House;  Surgeon: Armandina Stammer, DO;  Location: [place];  Service: Gynecology;  Laterality: N/A;   LAPAROSCOPIC LIVER CYST UNROOFING N/A 09/17/2013   Procedure: LAPAROSCOPIC LIVER CYST UNROOFING;  Surgeon: Stark Klein, MD;  Location: [facility];  Service: General;  Laterality: N/A;   MOHS SURGERY  x2  last one approx. 2019   bcc of nose   ORIF ANKLE FRACTURE Left 2000   ORIF WRIST FRACTURE Right 10-30-2016  @[facility]    sclerotherapy on left leg  2013   TOTAL KNEE ARTHROPLASTY Right 10/23/2021   Procedure: TOTAL KNEE ARTHROPLASTY;  Surgeon: Gaynelle Arabian, MD;  Location: [facility];  Service: Orthopedics;  Laterality: Right;   TUBAL LIGATION  1982   Social History: Rare glass of wine. Retired Economist.  Family History: Small bowel polyps.    reports that she has never smoked. She has never used smokeless tobacco. She reports current alcohol use. She reports that she does not use drugs. family history includes Brain cancer (age of onset: 76) in her mother; CAD in her father; Colon polyps in her son; Diabetes in her father; Heart disease in her father; Hypertension in her father and son. Allergies  Allergen Reactions   Excedrin Migraine [Aspirin-Acetaminophen-Caffeine] Other (See Comments)    Bruising       Outpatient Encounter Medications as of 07/13/2022  Medication Sig   Calcium Carb-Cholecalciferol (CALTRATE 600+D3 PO) Take 1  tablet by mouth daily.   cholecalciferol (VITAMIN D3) 25 MCG (1000 UNIT) tablet Take 1,000 Units by mouth daily.   ipratropium (ATROVENT) 0.03 % nasal spray Place 2 sprays into both nostrils 2 (two) times daily. SPRAY 2 SPRAYS INTO BOTH NOSTRILS EVERY 12 HOURS   Iron-FA-B Cmp-C-Biot-Probiotic (FUSION PLUS) CAPS Take 1 capsule by mouth daily.   minoxidil (ROGAINE) 2 % external solution Apply 1 application. topically 3 (three) times a week.   Multiple Vitamin (MULTIVITAMIN WITH MINERALS) TABS tablet Take 1 tablet by mouth every morning.   omeprazole (PRILOSEC) 20 MG capsule TAKE 1 CAPSULE BY MOUTH EVERY DAY   sertraline (ZOLOFT) 100 MG tablet TAKE 1 TABLET BY MOUTH EVERY DAY IN THE MORNING   SUMAtriptan (IMITREX) 100 MG tablet May repeat in 2 hours if headache persists or recurs.   No facility-administered encounter medications on file as of 07/13/2022.     REVIEW OF SYSTEMS:  Gen: Denies fever, sweats or chills. No weight loss.  CV: Denies chest pain, palpitations or edema. Resp: Denies cough, shortness of breath of hemoptysis.  GI: Denies heartburn, dysphagia, stomach or lower abdominal pain. No diarrhea or constipation.  GU : Denies urinary burning, blood in urine, increased urinary frequency or incontinence. MS: Denies joint pain, muscles aches or weakness. Derm: Denies rash, itchiness, skin lesions or unhealing ulcers. Psych: Denies depression, anxiety, memory loss or confusion. Heme: Denies bruising, easy bleeding. Neuro:  Denies headaches, dizziness or paresthesias. Endo:  Denies any problems with DM, thyroid or adrenal function.  PHYSICAL EXAM: BP 114/70 (BP Location: Left Arm, Patient Position: Sitting, Cuff Size: Normal)   Pulse 72   Ht 5' 9.25" (1.759 m) Comment: height measured without shoes  Wt 191 lb 2 oz (86.7 kg)   BMI 28.02 kg/m  General: in no acute distress. Head: Normocephalic and atraumatic. Eyes:  Sclerae non-icteric, conjunctive pink. Ears: Normal auditory  acuity. Mouth: Dentition intact. No ulcers or lesions.  Neck: Supple, no lymphadenopathy or thyromegaly.  Lungs: Clear bilaterally to auscultation without wheezes, crackles or rhonchi. Heart: Regular rate and rhythm. No murmur, rub or gallop appreciated.  Abdomen: Soft, nontender, nondistended. No masses. No hepatosplenomegaly. Normoactive bowel sounds x 4 quadrants.  Rectal:  Musculoskeletal: Symmetrical with no gross deformities. Skin: Warm and dry.  No rash or lesions on visible extremities. Extremities: No edema. Neurological: Alert oriented x 4, no focal deficits.  Psychological:  Alert and cooperative. Normal mood and affect.  ASSESSMENT AND PLAN:    CC:  Lorraine Woods, *

## 2022-07-16 NOTE — Progress Notes (Signed)
Agree with assessment and plan as outlined.  

## 2022-08-01 ENCOUNTER — Telehealth: Payer: Self-pay | Admitting: Nurse Practitioner

## 2022-08-01 NOTE — Telephone Encounter (Signed)
Patient is returning your call.  

## 2022-08-01 NOTE — Telephone Encounter (Signed)
Lorraine Woods, pls contact patient and schedule her for an EGD and colonoscopy, let her know the available dates Dr. Havery Moros has available.  She can stop taking the iron supplement and send her back to our lab in 8 weeks to repeat a CBC and IBC + ferritin to make sure her levels haven't dropped. THX

## 2022-08-01 NOTE — Telephone Encounter (Signed)
Left phone message for pt to call back

## 2022-08-02 ENCOUNTER — Other Ambulatory Visit: Payer: Self-pay

## 2022-08-02 DIAGNOSIS — D509 Iron deficiency anemia, unspecified: Secondary | ICD-10-CM

## 2022-08-02 DIAGNOSIS — Z86010 Personal history of colonic polyps: Secondary | ICD-10-CM

## 2022-08-02 DIAGNOSIS — K2100 Gastro-esophageal reflux disease with esophagitis, without bleeding: Secondary | ICD-10-CM

## 2022-08-02 MED ORDER — SUTAB 1479-225-188 MG PO TABS: ORAL_TABLET | ORAL | 0 refills | Status: DC

## 2022-08-02 NOTE — Telephone Encounter (Signed)
Pt made aware of Carl Best NP recommendations: Pt was scheduled for an EGD/ Colon on 10/01/2022 at 10:00 AM with Dr. Havery Moros in the [facility]. Pt made aware: Prep sent to pharmacy. Pt made aware. Prep instructions sent to pt via my chart. Pt made aware.  Ambulatory referral to GI placed in Epic. Pt made aware.   Pt notified to stop taking iron supplements and come to our lab in 8 weeks. Lab orders entered into Epic   Pt made aware to mark her calendar for 09/27/2022 to have labs drawn.  Pt verbalized understanding with all questions answered.

## 2022-08-02 NOTE — Telephone Encounter (Signed)
Spoke with pt.  Documented in my chart encounter.  Pt verbalized understanding with all questions answered.

## 2022-09-14 DIAGNOSIS — Z1231 Encounter for screening mammogram for malignant neoplasm of breast: Secondary | ICD-10-CM | POA: Diagnosis not present

## 2022-09-14 LAB — HM MAMMOGRAPHY

## 2022-09-20 ENCOUNTER — Encounter: Payer: Self-pay | Admitting: Family Medicine

## 2022-09-24 ENCOUNTER — Ambulatory Visit (INDEPENDENT_AMBULATORY_CARE_PROVIDER_SITE_OTHER): Payer: Medicare Other | Admitting: Family Medicine

## 2022-09-24 DIAGNOSIS — Z0000 Encounter for general adult medical examination without abnormal findings: Secondary | ICD-10-CM | POA: Diagnosis not present

## 2022-09-24 NOTE — Progress Notes (Signed)
MEDICARE ANNUAL WELLNESS VISIT  09/24/2022  Telephone Visit Disclaimer This Medicare AWV was conducted by telephone due to national recommendations for restrictions regarding the COVID-19 Pandemic (e.g. social distancing).  I verified, using two identifiers, that I am speaking with Lorraine Woods or their authorized healthcare agent. I discussed the limitations, risks, security, and privacy concerns of performing an evaluation and management service by telephone and the potential availability of an in-person appointment in the future. The patient expressed understanding and agreed to proceed.  Location of Patient: home Location of Provider (nurse):  In the office.  Subjective:    Lorraine Woods is a 78 y.o. female patient of Metheney, Barbarann Ehlers, MD who had a Medicare Annual Wellness Visit today via telephone. Lorraine Woods is Retired and lives with their spouse. she has 2 children. she reports that she is socially active and does interact with friends/family regularly. she is minimally physically active and enjoys going to the gym.  Patient Care Team: Agapito Games, MD as PCP - General (Family Medicine) Penne Lash Fredrich Romans, MD as Consulting Physician (Obstetrics and Gynecology) Gabriel Carina, [facility] as Pharmacist (Pharmacist)     09/24/2022   10:02 AM 10/24/2021   12:00 AM 10/12/2021   10:37 AM 09/18/2021   10:09 AM 08/03/2020   10:51 AM 06/15/2020    9:21 AM 05/11/2019    9:27 AM  Advanced Directives  Does Patient Have a Medical Advance Directive? Yes Yes Yes Yes Yes Yes Yes  Type of Advance Directive Living will Healthcare Power of [place];Living will Healthcare Power of [place];Living will Living will;Healthcare Power of [employer] Power of [place];Living will Living will Healthcare Power of [place];Living will  Does patient want to make changes to medical advance directive? No - Patient declined No - Patient declined  No - Patient declined No - Patient declined No - Patient  declined No - Patient declined  Copy of Healthcare Power of Attorney in Chart?  No - copy requested  Yes - validated most recent copy scanned in chart (See row information) No - copy requested  No - copy requested    Hospital Utilization Over the Past 12 Months: # of hospitalizations or ER visits: 0 # of surgeries: 1  Review of Systems    Patient reports that her overall health is unchanged compared to last year.  History obtained from chart review and the patient  Patient Reported Readings (BP, Pulse, CBG, Weight, etc) none  Pain Assessment Pain : No/denies pain     Current Medications & Allergies (verified) Allergies as of 09/24/2022       Reactions   Excedrin Migraine [aspirin-acetaminophen-caffeine] Other (See Comments)   Bruising        Medication List        Accurate as of September 24, 2022 10:17 AM. If you have any questions, ask your nurse or doctor.          CALTRATE 600+D3 PO Take 1 tablet by mouth daily.   cholecalciferol 25 MCG (1000 UNIT) tablet Commonly known as: VITAMIN D3 Take 1,000 Units by mouth daily.   Fusion Plus Caps Take 1 capsule by mouth daily.   ipratropium 0.03 % nasal spray Commonly known as: ATROVENT Place 2 sprays into both nostrils 2 (two) times daily. SPRAY 2 SPRAYS INTO BOTH NOSTRILS EVERY 12 HOURS   minoxidil 2 % external solution Commonly known as: ROGAINE Apply 1 application. topically 3 (three) times a week.   multivitamin with minerals Tabs tablet Take  1 tablet by mouth every morning.   omeprazole 20 MG capsule Commonly known as: PRILOSEC TAKE 1 CAPSULE BY MOUTH EVERY DAY   sertraline 100 MG tablet Commonly known as: ZOLOFT TAKE 1 TABLET BY MOUTH EVERY DAY IN THE MORNING   SUMAtriptan 100 MG tablet Commonly known as: IMITREX May repeat in 2 hours if headache persists or recurs.   Sutab [phone] MG Tabs Generic drug: Sodium Sulfate-Mag Sulfate-KCl Take as instructed on your prep letter sent to you         History (reviewed): Past Medical History:  Diagnosis Date   Allergy    Anemia    labs ok now   Arthritis    Basal cell carcinoma    BASAL CELL ON NOSE   Depression    GAD (generalized anxiety disorder)    GERD (gastroesophageal reflux disease)    History of basal cell carcinoma (BCC) excision    nose  x2  last one 2019   Hyperlipidemia    Hyperplastic colon polyp    Knee pain, chronic    Migraine    Osteopenia    PMB (postmenopausal bleeding)    Seasonal allergies    Thickened endometrium    Wears glasses    Past Surgical History:  Procedure Laterality Date   APPENDECTOMY  72   age 38   BREAST LUMPECTOMY Right 2002   on HRT, fibromadenoma   CHOLECYSTECTOMY N/A 09/17/2013   Procedure: LAPAROSCOPIC CHOLECYSTECTOMY;  Surgeon: Almond Lint, MD;  Location: [facility];  Service: General;  Laterality: N/A;   COLONOSCOPY WITH PROPOFOL  last one 06-28-2017  dr Adela Lank   DILATATION & CURETTAGE/HYSTEROSCOPY WITH MYOSURE N/A 06/15/2020   Procedure: DILATATION & CURETTAGE/HYSTEROSCOPY WITH MYOSURE;  Surgeon: Toy Baker, DO;  Location: [facility];  Service: Gynecology;  Laterality: N/A;   FRACTURE SURGERY     lt. ankle, rt. wrist   JOINT REPLACEMENT  10/2021   rt. knee   LAPAROSCOPIC LIVER CYST UNROOFING N/A 09/17/2013   Procedure: LAPAROSCOPIC LIVER CYST UNROOFING;  Surgeon: Almond Lint, MD;  Location: [facility];  Service: General;  Laterality: N/A;   MOHS SURGERY  x2  last one approx. 2019   bcc of nose   ORIF ANKLE FRACTURE Left 2000   ORIF WRIST FRACTURE Right 10-30-2016  @[facility]    sclerotherapy on left leg  2013   TOTAL KNEE ARTHROPLASTY Right 10/23/2021   Procedure: TOTAL KNEE ARTHROPLASTY;  Surgeon: Ollen Gross, MD;  Location: [facility];  Service: Orthopedics;  Laterality: Right;   TUBAL LIGATION  1982   Family History  Problem Relation Age of Onset   Brain cancer Mother 31   Cancer Mother    Diabetes Father    Hypertension Father    CAD Father     Heart disease Father    Colon polyps Son        Puetz-jehgers syndrome   Hypertension Son    Colon cancer Neg Hx    Esophageal cancer Neg Hx    Rectal cancer Neg Hx    Stomach cancer Neg Hx    Social History   Socioeconomic History   Marital status: Married    Spouse name: John    Number of children: 2   Years of education: 16   Highest education level: Associate degree: academic program  Occupational History   Occupation: Echo Retail buyer: [employer]    Comment: retired  Tobacco Use   Smoking status: Never  Smokeless tobacco: Never  Vaping Use   Vaping Use: Never used  Substance and Sexual Activity   Alcohol use: Yes    Comment: on special occasions only   Drug use: Never   Sexual activity: Not Currently    Partners: Male    Birth control/protection: Post-menopausal  Other Topics Concern   Not on file  Social History Narrative   Lives with her husband. Enjoys going to the gym.   Social Determinants of Health   Financial Resource Strain: Low Risk  (09/20/2022)   Overall Financial Resource Strain (CARDIA)    Difficulty of Paying Living Expenses: Not hard at all  Food Insecurity: No Food Insecurity (09/20/2022)   Hunger Vital Sign    Worried About Running Out of Food in the Last Year: Never true    Ran Out of Food in the Last Year: Never true  Transportation Needs: No Transportation Needs (09/20/2022)   PRAPARE - Administrator, Civil Service (Medical): No    Lack of Transportation (Non-Medical): No  Physical Activity: Insufficiently Active (09/20/2022)   Exercise Vital Sign    Days of Exercise per Week: 2 days    Minutes of Exercise per Session: 20 min  Stress: No Stress Concern Present (09/20/2022)   [employer] of Occupational Health - Occupational Stress Questionnaire    Feeling of Stress : Only a little  Social Connections: Moderately Integrated (09/24/2022)   Social Connection and Isolation Panel [NHANES]     Frequency of Communication with Friends and Family: Twice a week    Frequency of Social Gatherings with Friends and Family: Never    Attends Religious Services: More than 4 times per year    Active Member of [employer] or Organizations: Yes    Attends Banker Meetings: More than 4 times per year    Marital Status: Married    Activities of Daily Living    09/20/2022    8:15 PM 10/24/2021   12:00 AM  In your present state of health, do you have any difficulty performing the following activities:  Hearing? 0 1  Vision? 0 0  Difficulty concentrating or making decisions? 1 1  Walking or climbing stairs? 1 0  Dressing or bathing? 0 0  Doing errands, shopping? 0 0  Preparing Food and eating ? N   Using the Toilet? N   In the past six months, have you accidently leaked urine? Y   Do you have problems with loss of bowel control? N   Managing your Medications? N   Managing your Finances? N   Housekeeping or managing your Housekeeping? N     Patient Education/ Literacy How often do you need to have someone help you when you read instructions, pamphlets, or other written materials from your doctor or pharmacy?: 1 - Never What is the last grade level you completed in school?: x-ray school and 2 years of college  Exercise Current Exercise Habits: Home exercise routine, Type of exercise: Other - see comments;stretching (stationary bike), Time (Minutes): 20, Frequency (Times/Week): 2, Weekly Exercise (Minutes/Week): 40, Intensity: Moderate, Exercise limited by: None identified  Diet Patient reports consuming 3 meals a day and 2 snack(s) a day Patient reports that her primary diet is: Regular Patient reports that she does have regular access to food.   Depression Screen    09/24/2022   10:03 AM 06/01/2022    1:55 PM 09/18/2021   10:10 AM 11/02/2020   11:15 AM 08/03/2020   10:53 AM 10/26/2019  5:49 PM 05/11/2019    9:29 AM  PHQ 2/9 Scores  PHQ - 2 Score 0 0 1 1 0 1 0  PHQ- 9 Score   0  Exception Documentation       Medical reason     Fall Risk    09/24/2022   10:03 AM 09/20/2022    8:15 PM 06/01/2022    1:55 PM 11/30/2021    8:26 AM 09/18/2021   10:09 AM  Fall Risk   Falls in the past year? 1 1 0 1 1  Number falls in past yr: 0  Injury with Fall? 1 1 0 0 0  Risk for fall due to : History of fall(s)  No Fall Risks History of fall(s);Impaired balance/gait Impaired balance/gait;Orthopedic patient  Follow up Falls evaluation completed;Education provided;Falls prevention discussed  Falls evaluation completed Falls prevention discussed;Falls evaluation completed Falls evaluation completed     Objective:  ANEDRA PENAFIEL seemed alert and oriented and she participated appropriately during our telephone visit.  Blood Pressure Weight BMI  BP Readings from Last 3 Encounters:  07/13/22 114/70  06/01/22 (!) 128/53  11/30/21 (!) 121/55   Wt Readings from Last 3 Encounters:  07/13/22 191 lb 2 oz (86.7 kg)  06/01/22 192 lb (87.1 kg)  11/30/21 188 lb (85.3 kg)   BMI Readings from Last 1 Encounters:  07/13/22 28.02 kg/m    *Unable to obtain current vital signs, weight, and BMI due to telephone visit type  Hearing/Vision  Shereena did not seem to have difficulty with hearing/understanding during the telephone conversation Reports that she has had a formal eye exam by an eye care professional within the past year Reports that she has not had a formal hearing evaluation within the past year *Unable to fully assess hearing and vision during telephone visit type  Cognitive Function:    09/24/2022   10:10 AM 09/18/2021   10:21 AM 08/03/2020   11:07 AM 05/11/2019    9:35 AM 05/05/2018    9:52 AM  6CIT Screen  What Year? 0 points 0 points 0 points 0 points 0 points  What month? 0 points 0 points 0 points 0 points 0 points  What time? 0 points 0 points 0 points 0 points 0 points  Count back from 20 0 points 0 points 0 points 0 points 0 points  Months in reverse 0  points 0 points 0 points 0 points 0 points  Repeat phrase 0 points 2 points 2 points 0 points 2 points  Total Score 0 points 2 points 2 points 0 points 2 points   (Normal:0-7, Significant for Dysfunction: >8)  Normal Cognitive Function Screening: Yes   Immunization & Health Maintenance Record Immunization History  Administered Date(s) Administered   COVID-19, mRNA, vaccine(Comirnaty)12 years and older 04/06/2022   Fluad Quad(high Dose 65+) 02/11/2019, 03/14/2021, 04/30/2022   Influenza, High Dose Seasonal PF 03/12/2016, 03/07/2017, 03/06/2020   Influenza, Seasonal, Injecte, Preservative Fre 03/05/2014   Influenza,inj,Quad PF,6+ Mos 03/10/2018   Influenza-Unspecified 03/04/2013, 03/05/2015, 03/14/2016   PFIZER Comirnaty(Gray Top)Covid-19 Tri-Sucrose Vaccine 10/11/2020   PFIZER(Purple Top)SARS-COV-2 Vaccination 07/06/2019, 07/27/2019, 04/06/2020   Pfizer Covid-19 Vaccine Bivalent Booster 49yrs & up 03/31/2021   Pneumococcal Conjugate-13 12/29/2013   Pneumococcal Polysaccharide-23 06/04/2005, 10/20/2015   Tdap 03/15/2009, 04/02/2020   Zoster, Live 01/03/2012, 01/30/2012    Health Maintenance  Topic Date Due   COVID-19 Vaccine (7 - 2023-24 season) 12/31/2022 (Originally 06/01/2022)   Zoster Vaccines- Shingrix (  1 of 2) 06/04/2023 (Originally 08/09/1964)   COLONOSCOPY (Pts 45-45yrs Insurance coverage will need to be confirmed)  09/24/2023 (Originally 06/28/2022)   INFLUENZA VACCINE  01/03/2023   MAMMOGRAM  09/14/2023   Medicare Annual Wellness (AWV)  09/24/2023   DTaP/Tdap/Td (3 - Td or Tdap) 04/02/2030   Pneumonia Vaccine 61+ Years old  Completed   DEXA SCAN  Completed   Hepatitis C Screening  Completed   HPV VACCINES  Aged Out       Assessment  This is a routine wellness examination for LOGEN Kallstrom.  Health Maintenance: Due or Overdue There are no preventive care reminders to display for this patient.   Lorraine Woods does not need a referral for Community  Assistance: Care Management:   no Social Work:    no Prescription Assistance:  no Nutrition/Diabetes Education:  no   Plan:  Personalized Goals  Goals Addressed               This Visit's Progress     Patient Stated (pt-stated)        Patient stated that she would like to work on being more physically active.       Personalized Health Maintenance & Screening Recommendations  Colorectal cancer screening - scheduled Shingrix vaccine Dexa scan- due in October.   Lung Cancer Screening Recommended: no (Low Dose CT Chest recommended if Age 19-80 years, 30 pack-year currently smoking OR have quit w/in past 15 years) Hepatitis C Screening recommended: no HIV Screening recommended: no  Advanced Directives: Written information was not prepared per patient's request.  Referrals & Orders No orders of the defined types were placed in this encounter.   Follow-up Plan Follow-up with Agapito Games, MD as planned Schedule shingles vaccine at the pharmacy. Medicare wellness visit in one year.  Patient will access AVS on my chart.   I have personally reviewed and noted the following in the patient's chart:   Medical and social history Use of alcohol, tobacco or illicit drugs  Current medications and supplements Functional ability and status Nutritional status Physical activity Advanced directives List of other physicians Hospitalizations, surgeries, and ER visits in previous 12 months Vitals Screenings to include cognitive, depression, and falls Referrals and appointments  In addition, I have reviewed and discussed with Lorraine Woods certain preventive protocols, quality metrics, and best practice recommendations. A written personalized care plan for preventive services as well as general preventive health recommendations is available and can be mailed to the patient at her request.      Modesto Charon, RN BSN  09/24/2022

## 2022-09-24 NOTE — Patient Instructions (Addendum)
MEDICARE ANNUAL WELLNESS VISIT Health Maintenance Summary and Written Plan of Care  Ms. Lorraine Woods ,  Thank you for allowing me to perform your Medicare Annual Wellness Visit and for your ongoing commitment to your health.   Health Maintenance & Immunization History Health Maintenance  Topic Date Due   COVID-19 Vaccine (7 - 2023-24 season) 12/31/2022 (Originally 06/01/2022)   Zoster Vaccines- Shingrix (1 of 2) 06/04/2023 (Originally 08/09/1964)   COLONOSCOPY (Pts 45-5yrs Insurance coverage will need to be confirmed)  09/24/2023 (Originally 06/28/2022)   INFLUENZA VACCINE  01/03/2023   MAMMOGRAM  09/14/2023   Medicare Annual Wellness (AWV)  09/24/2023   DTaP/Tdap/Td (3 - Td or Tdap) 04/02/2030   Pneumonia Vaccine 25+ Years old  Completed   DEXA SCAN  Completed   Hepatitis C Screening  Completed   HPV VACCINES  Aged Out   Immunization History  Administered Date(s) Administered   COVID-19, mRNA, vaccine(Comirnaty)12 years and older 04/06/2022   Fluad Quad(high Dose 65+) 02/11/2019, 03/14/2021, 04/30/2022   Influenza, High Dose Seasonal PF 03/12/2016, 03/07/2017, 03/06/2020   Influenza, Seasonal, Injecte, Preservative Fre 03/05/2014   Influenza,inj,Quad PF,6+ Mos 03/10/2018   Influenza-Unspecified 03/04/2013, 03/05/2015, 03/14/2016   PFIZER Comirnaty(Gray Top)Covid-19 Tri-Sucrose Vaccine 10/11/2020   PFIZER(Purple Top)SARS-COV-2 Vaccination 07/06/2019, 07/27/2019, 04/06/2020   Pfizer Covid-19 Vaccine Bivalent Booster 29yrs & up 03/31/2021   Pneumococcal Conjugate-13 12/29/2013   Pneumococcal Polysaccharide-23 06/04/2005, 10/20/2015   Tdap 03/15/2009, 04/02/2020   Zoster, Live 01/03/2012, 01/30/2012    These are the patient goals that we discussed:  Goals Addressed               This Visit's Progress     Patient Stated (pt-stated)        Patient stated that she would like to work on being more physically active.         This is a list of Health Maintenance Items that are  overdue or due now: Colorectal cancer screening - scheduled Shingrix vaccine Dexa scan- due in October.    Orders/Referrals Placed Today: No orders of the defined types were placed in this encounter.  (Contact our referral department at [phone] if you have not spoken with someone about your referral appointment within the next 5 days)    Follow-up Plan Follow-up with Agapito Games, MD as planned Schedule shingles vaccine at the pharmacy. Medicare wellness visit in one year.  Patient will access AVS on my chart.      Health Maintenance, Female Adopting a healthy lifestyle and getting preventive care are important in promoting health and wellness. Ask your health care provider about: The right schedule for you to have regular tests and exams. Things you can do on your own to prevent diseases and keep yourself healthy. What should I know about diet, weight, and exercise? Eat a healthy diet  Eat a diet that includes plenty of vegetables, fruits, low-fat dairy products, and lean protein. Do not eat a lot of foods that are high in solid fats, added sugars, or sodium. Maintain a healthy weight Body mass index (BMI) is used to identify weight problems. It estimates body fat based on height and weight. Your health care provider can help determine your BMI and help you achieve or maintain a healthy weight. Get regular exercise Get regular exercise. This is one of the most important things you can do for your health. Most adults should: Exercise for at least 150 minutes each week. The exercise should increase your heart rate and make you sweat (moderate-intensity  exercise). Do strengthening exercises at least twice a week. This is in addition to the moderate-intensity exercise. Spend less time sitting. Even light physical activity can be beneficial. Watch cholesterol and blood lipids Have your blood tested for lipids and cholesterol at 76 years of age, then have this test  every 5 years. Have your cholesterol levels checked more often if: Your lipid or cholesterol levels are high. You are older than 76 years of age. You are at high risk for heart disease. What should I know about cancer screening? Depending on your health history and family history, you may need to have cancer screening at various ages. This may include screening for: Breast cancer. Cervical cancer. Colorectal cancer. Skin cancer. Lung cancer. What should I know about heart disease, diabetes, and high blood pressure? Blood pressure and heart disease High blood pressure causes heart disease and increases the risk of stroke. This is more likely to develop in people who have high blood pressure readings or are overweight. Have your blood pressure checked: Every 3-5 years if you are 71-70 years of age. Every year if you are 36 years old or older. Diabetes Have regular diabetes screenings. This checks your fasting blood sugar level. Have the screening done: Once every three years after age 19 if you are at a normal weight and have a low risk for diabetes. More often and at a younger age if you are overweight or have a high risk for diabetes. What should I know about preventing infection? Hepatitis B If you have a higher risk for hepatitis B, you should be screened for this virus. Talk with your health care provider to find out if you are at risk for hepatitis B infection. Hepatitis C Testing is recommended for: Everyone born from [birth year] through 1964. Anyone with known risk factors for hepatitis C. Sexually transmitted infections (STIs) Get screened for STIs, including gonorrhea and chlamydia, if: You are sexually active and are younger than 76 years of age. You are older than 76 years of age and your health care provider tells you that you are at risk for this type of infection. Your sexual activity has changed since you were last screened, and you are at increased risk for chlamydia or  gonorrhea. Ask your health care provider if you are at risk. Ask your health care provider about whether you are at high risk for HIV. Your health care provider may recommend a prescription medicine to help prevent HIV infection. If you choose to take medicine to prevent HIV, you should first get tested for HIV. You should then be tested every 3 months for as long as you are taking the medicine. Pregnancy If you are about to stop having your period (premenopausal) and you may become pregnant, seek counseling before you get pregnant. Take 400 to 800 micrograms (mcg) of folic acid every day if you become pregnant. Ask for birth control (contraception) if you want to prevent pregnancy. Osteoporosis and menopause Osteoporosis is a disease in which the bones lose minerals and strength with aging. This can result in bone fractures. If you are 89 years old or older, or if you are at risk for osteoporosis and fractures, ask your health care provider if you should: Be screened for bone loss. Take a calcium or vitamin D supplement to lower your risk of fractures. Be given hormone replacement therapy (HRT) to treat symptoms of menopause. Follow these instructions at home: Alcohol use Do not drink alcohol if: Your health care provider tells you  not to drink. You are pregnant, may be pregnant, or are planning to become pregnant. If you drink alcohol: Limit how much you have to: 0-1 drink a day. Know how much alcohol is in your drink. In the [place] one drink equals one 12 oz bottle of beer (355 mL), one 5 oz glass of wine (148 mL), or one 1 oz glass of hard liquor (44 mL). Lifestyle Do not use any products that contain nicotine or tobacco. These products include cigarettes, chewing tobacco, and vaping devices, such as e-cigarettes. If you need help quitting, ask your health care provider. Do not use street drugs. Do not share needles. Ask your health care provider for help if you need support or  information about quitting drugs. General instructions Schedule regular health, dental, and eye exams. Stay current with your vaccines. Tell your health care provider if: You often feel depressed. You have ever been abused or do not feel safe at home. Summary Adopting a healthy lifestyle and getting preventive care are important in promoting health and wellness. Follow your health care provider's instructions about healthy diet, exercising, and getting tested or screened for diseases. Follow your health care provider's instructions on monitoring your cholesterol and blood pressure. This information is not intended to replace advice given to you by your health care provider. Make sure you discuss any questions you have with your health care provider. Document Revised: 10/10/2020 Document Reviewed: 10/10/2020 Elsevier Patient Education  2023 [employer].

## 2022-09-27 ENCOUNTER — Other Ambulatory Visit (INDEPENDENT_AMBULATORY_CARE_PROVIDER_SITE_OTHER): Payer: Medicare Other

## 2022-09-27 DIAGNOSIS — D509 Iron deficiency anemia, unspecified: Secondary | ICD-10-CM

## 2022-09-27 LAB — CBC WITH DIFFERENTIAL/PLATELET
Basophils Absolute: 0 10*3/uL (ref 0.0–0.1)
Basophils Relative: 0.5 % (ref 0.0–3.0)
Eosinophils Absolute: 0.1 10*3/uL (ref 0.0–0.7)
Eosinophils Relative: 2.1 % (ref 0.0–5.0)
HCT: 43.4 % (ref 36.0–46.0)
Hemoglobin: 14.7 g/dL (ref 12.0–15.0)
Lymphocytes Relative: 28 % (ref 12.0–46.0)
Lymphs Abs: 1.7 10*3/uL (ref 0.7–4.0)
MCHC: 33.9 g/dL (ref 30.0–36.0)
MCV: 88.1 fl (ref 78.0–100.0)
Monocytes Absolute: 0.5 10*3/uL (ref 0.1–1.0)
Monocytes Relative: 8.3 % (ref 3.0–12.0)
Neutro Abs: 3.6 10*3/uL (ref 1.4–7.7)
Neutrophils Relative %: 61.1 % (ref 43.0–77.0)
Platelets: 235 10*3/uL (ref 150.0–400.0)
RBC: 4.93 Mil/uL (ref 3.87–5.11)
RDW: 12.9 % (ref 11.5–15.5)
WBC: 6 10*3/uL (ref 4.0–10.5)

## 2022-09-27 LAB — IBC + FERRITIN
Ferritin: 40.7 ng/mL (ref 10.0–291.0)
Iron: 88 ug/dL (ref 42–145)
Saturation Ratios: 28.7 % (ref 20.0–50.0)
TIBC: 306.6 ug/dL (ref 250.0–450.0)
Transferrin: 219 mg/dL (ref 212.0–360.0)

## 2022-10-01 ENCOUNTER — Ambulatory Visit (AMBULATORY_SURGERY_CENTER): Payer: Medicare Other | Admitting: Gastroenterology

## 2022-10-01 ENCOUNTER — Encounter: Payer: Self-pay | Admitting: Gastroenterology

## 2022-10-01 VITALS — BP 116/73 | HR 64 | Temp 98.6°F | Resp 14 | Ht 69.25 in | Wt 191.0 lb

## 2022-10-01 DIAGNOSIS — E785 Hyperlipidemia, unspecified: Secondary | ICD-10-CM | POA: Diagnosis not present

## 2022-10-01 DIAGNOSIS — K2100 Gastro-esophageal reflux disease with esophagitis, without bleeding: Secondary | ICD-10-CM | POA: Diagnosis not present

## 2022-10-01 DIAGNOSIS — D509 Iron deficiency anemia, unspecified: Secondary | ICD-10-CM | POA: Diagnosis not present

## 2022-10-01 DIAGNOSIS — K2970 Gastritis, unspecified, without bleeding: Secondary | ICD-10-CM

## 2022-10-01 DIAGNOSIS — D125 Benign neoplasm of sigmoid colon: Secondary | ICD-10-CM | POA: Diagnosis not present

## 2022-10-01 DIAGNOSIS — K2270 Barrett's esophagus without dysplasia: Secondary | ICD-10-CM | POA: Diagnosis not present

## 2022-10-01 DIAGNOSIS — F411 Generalized anxiety disorder: Secondary | ICD-10-CM | POA: Diagnosis not present

## 2022-10-01 DIAGNOSIS — Z09 Encounter for follow-up examination after completed treatment for conditions other than malignant neoplasm: Secondary | ICD-10-CM

## 2022-10-01 DIAGNOSIS — Z860100 Personal history of colon polyps, unspecified: Secondary | ICD-10-CM

## 2022-10-01 DIAGNOSIS — K635 Polyp of colon: Secondary | ICD-10-CM | POA: Diagnosis not present

## 2022-10-01 DIAGNOSIS — F32A Depression, unspecified: Secondary | ICD-10-CM | POA: Diagnosis not present

## 2022-10-01 MED ORDER — SODIUM CHLORIDE 0.9 % IV SOLN: 500.0000 mL | Freq: Once | INTRAVENOUS | Status: DC

## 2022-10-01 NOTE — Progress Notes (Signed)
Called to room to assist during endoscopic procedure.  Patient ID and intended procedure confirmed with present staff. Received instructions for my participation in the procedure from the performing physician.  

## 2022-10-01 NOTE — Progress Notes (Signed)
A/ox3, pleased with MAC, report to RN 

## 2022-10-01 NOTE — Op Note (Signed)
[facility] Patient Name: Lorraine Woods Procedure Date: 10/01/2022 10:14 AM MRN: [id number] Endoscopist: Viviann Spare P. Adela Lank , MD, [id number] Age: 77 Referring MD:  Date of Birth: 1945-06-13 Gender: Female Account #: [id number] Procedure:                Upper GI endoscopy Indications:              Iron deficiency anemia in the setting of blood                            donation in 2023 - since has since resolve, history                            of gastro-esophageal reflux disease Medicines:                Monitored Anesthesia Care Procedure:                Pre-Anesthesia Assessment:                           - Prior to the procedure, a History and Physical                            was performed, and patient medications and                            allergies were reviewed. The patient's tolerance of                            previous anesthesia was also reviewed. The risks                            and benefits of the procedure and the sedation                            options and risks were discussed with the patient.                            All questions were answered, and informed consent                            was obtained. Prior Anticoagulants: The patient has                            taken no anticoagulant or antiplatelet agents. ASA                            Grade Assessment: II - A patient with mild systemic                            disease. After reviewing the risks and benefits,                            the patient was deemed in satisfactory condition to  undergo the procedure.                           After obtaining informed consent, the endoscope was                            passed under direct vision. Throughout the                            procedure, the patient's blood pressure, pulse, and                            oxygen saturations were monitored continuously. The                            GIF [id number]  #[id number] was introduced through the                            mouth, and advanced to the second part of duodenum.                            The upper GI endoscopy was accomplished without                            difficulty. The patient tolerated the procedure                            well. Scope In: Scope Out: Findings:                 Esophagogastric landmarks were identified: the                            Z-line was found at 38 cm, the gastroesophageal                            junction was found at 39 cm and the upper extent of                            the gastric folds was found at 40 cm from the                            incisors.                           A 1 cm hiatal hernia was present.                           The Z-line was irregular with 2 tongues of salmon                            colored mucosa about 1 cm in length. Biopsies were                            taken with a  cold forceps for histology.                           The exam of the esophagus was otherwise normal.                           Patchy inflammation characterized by adherent                            blood, erythema and friability was found in the                            gastric body and in the gastric antrum. No focal                            ulcerations                           The exam of the stomach was otherwise normal.                           Biopsies were taken with a cold forceps for                            Helicobacter pylori testing.                           The examined duodenum was normal. Complications:            No immediate complications. Estimated blood loss:                            Minimal. Estimated Blood Loss:     Estimated blood loss was minimal. Impression:               - Esophagogastric landmarks identified.                           - 1 cm hiatal hernia.                           - Z-line irregular. Biopsied.                           - Normal esophagus  otherwise.                           - Gastritis. Biopsied.                           - Normal examined duodenum.                           Possible gastritis could be contributing to IDA,                            although has since resolved. IDA could have been  driven by blood donation. Recommendation:           - Patient has a contact number available for                            emergencies. The signs and symptoms of potential                            delayed complications were discussed with the                            patient. Return to normal activities tomorrow.                            Written discharge instructions were provided to the                            patient.                           - Resume previous diet.                           - Continue present medications.                           - Minimize NSAID use                           - Resume omeprazole 20mg  / day for 30 days at least                            to treat gastritis. If patient has Barrett's on                            biopsies would continue indefinitely                           - Await pathology results. Viviann Spare P. Dammon Makarewicz, MD 10/01/2022 11:00:12 AM This report has been signed electronically.

## 2022-10-01 NOTE — Op Note (Addendum)
[facility] Patient Name: Lorraine Woods Procedure Date: 10/01/2022 10:04 AM MRN: [id number] Endoscopist: Viviann Spare P. Adela Lank , MD, [id number] Age: 77 Referring MD:  Date of Birth: Oct 09, 1945 Gender: Female Account #: [id number] Procedure:                Colonoscopy Indications:              Iron deficiency anemia - history of colon polyps -                            cecal "hyperplastic" polyp removed 06/2017 Medicines:                Monitored Anesthesia Care Procedure:                Pre-Anesthesia Assessment:                           - Prior to the procedure, a History and Physical                            was performed, and patient medications and                            allergies were reviewed. The patient's tolerance of                            previous anesthesia was also reviewed. The risks                            and benefits of the procedure and the sedation                            options and risks were discussed with the patient.                            All questions were answered, and informed consent                            was obtained. Prior Anticoagulants: The patient has                            taken no anticoagulant or antiplatelet agents. ASA                            Grade Assessment: II - A patient with mild systemic                            disease. After reviewing the risks and benefits,                            the patient was deemed in satisfactory condition to                            undergo the procedure.  After obtaining informed consent, the colonoscope                            was passed under direct vision. Throughout the                            procedure, the patient's blood pressure, pulse, and                            oxygen saturations were monitored continuously. The                            Olympus [id number]-[id number] (#[id number]) Colonoscope was                            introduced  through the anus and advanced to the the                            terminal ileum, with identification of the                            appendiceal orifice and IC valve. The colonoscopy                            was performed without difficulty. The patient                            tolerated the procedure well. The quality of the                            bowel preparation was good. The terminal ileum,                            ileocecal valve, appendiceal orifice, and rectum                            were photographed. Scope In: 10:28:16 AM Scope Out: 10:47:49 AM Scope Withdrawal Time: 0 hours 12 minutes 31 seconds  Total Procedure Duration: 0 hours 19 minutes 33 seconds  Findings:                 The perianal and digital rectal examinations were                            normal.                           The terminal ileum appeared normal.                           Two sessile polyps were found in the sigmoid colon.                            The polyps were 3 to 4 mm in size. These polyps  were removed with a cold snare. Resection and                            retrieval were complete.                           Multiple small-mouthed diverticula were found in                            the sigmoid colon.                           Internal hemorrhoids were found during retroflexion.                           The exam was otherwise without abnormality. Complications:            No immediate complications. Estimated blood loss:                            Minimal. Estimated Blood Loss:     Estimated blood loss was minimal. Impression:               - The examined portion of the ileum was normal.                           - Two 3 to 4 mm polyps in the sigmoid colon,                            removed with a cold snare. Resected and retrieved.                           - Diverticulosis in the sigmoid colon.                           - Internal  hemorrhoids.                           - The examination was otherwise normal.                           No cause for iron deficiency noted on this exam.                           - The GI Genius (intelligent endoscopy module),                            computer-aided polyp detection system powered by AI                            was utilized to detect colorectal polyps through                            enhanced visualization during colonoscopy. Recommendation:           - Patient has a contact number available for  emergencies. The signs and symptoms of potential                            delayed complications were discussed with the                            patient. Return to normal activities tomorrow.                            Written discharge instructions were provided to the                            patient.                           - Resume previous diet.                           - Continue present medications.                           - Await pathology results. Do not think further                            colonoscopy for surveillance will be needed given                            no high risk lesions on this exam and patient's age [name]. Adela Lank, MD 10/01/2022 10:54:08 AM This report has been signed electronically.

## 2022-10-01 NOTE — Progress Notes (Signed)
Pt's states no medical or surgical changes since previsit or office visit. VS assessed by D.T 

## 2022-10-01 NOTE — Patient Instructions (Addendum)
Resume previous diet Continue present medications Await pathology results Minimize use of  NSAIDS (Non-Steroidal anti-inflammatory drugs).  (These include, aspirin, aspirin-containing products(products containing salicylic acid like Pepto Bismol and Alka Seltzer), ibuprofen, advil, motrin, naproxen, aleve, goody powders, etc) Tylenol is ok to take as needed, see label for instructions.  Handouts/information given for Hiatal Hernia,Gastritis, polyps, diverticulosis and hemorrhoids  YOU HAD AN ENDOSCOPIC PROCEDURE TODAY AT THE [facility]:   Refer to the procedure report that was given to you for any specific questions about what was found during the examination.  If the procedure report does not answer your questions, please call your gastroenterologist to clarify.  If you requested that your care partner not be given the details of your procedure findings, then the procedure report has been included in a sealed envelope for you to review at your convenience later.  YOU SHOULD EXPECT: Some feelings of bloating in the abdomen. Passage of more gas than usual.  Walking can help get rid of the air that was put into your GI tract during the procedure and reduce the bloating. If you had a lower endoscopy (such as a colonoscopy or flexible sigmoidoscopy) you may notice spotting of blood in your stool or on the toilet paper. If you underwent a bowel prep for your procedure, you may not have a normal bowel movement for a few days. Please Note:  You might notice some irritation and congestion in your nose or some drainage.  This is from the oxygen used during your procedure.  There is no need for concern and it should clear up in a day or so.  SYMPTOMS TO REPORT IMMEDIATELY:  Following lower endoscopy (colonoscopy):  Excessive amounts of blood in the stool  Significant tenderness or worsening of abdominal pains  Swelling of the abdomen that is new, acute  Fever of 100F or higher Following upper  endoscopy (EGD)  Vomiting of blood or coffee ground material  New chest pain or pain under the shoulder blades  Painful or persistently difficult swallowing  New shortness of breath  Black, tarry-looking stools For urgent or emergent issues, a gastroenterologist can be reached at any hour by calling [phone]. Do not use MyChart messaging for urgent concerns.   DIET:  We do recommend a small meal at first, but then you may proceed to your regular diet.  Drink plenty of fluids but you should avoid alcoholic beverages for 24 hours.  ACTIVITY:  You should plan to take it easy for the rest of today and you should NOT DRIVE or use heavy machinery until tomorrow (because of the sedation medicines used during the test).    FOLLOW UP: Our staff will call the number listed on your records the next business day following your procedure.  We will call around 7:15- 8:00 am to check on you and address any questions or concerns that you may have regarding the information given to you following your procedure. If we do not reach you, we will leave a message.     If any biopsies were taken you will be contacted by phone or by letter within the next 1-3 weeks.  Please call us at [phone] if you have not heard about the biopsies in 3 weeks.   SIGNATURES/CONFIDENTIALITY: You and/or your care partner have signed paperwork which will be entered into your electronic medical record.  These signatures attest to the fact that that the information above on your After Visit Summary has been reviewed and is  understood.  Full responsibility of the confidentiality of this discharge information lies with you and/or your care-partner. 

## 2022-10-01 NOTE — Progress Notes (Signed)
[facility] History and Physical   Primary Care Physician:  Agapito Games, MD   Reason for Procedure:   IDA, GERD, history of polyps  Plan:    EGD and colonoscopy     HPI: Lorraine Woods is a 77 y.o. female  here for EGD and colonoscopy to evaluate issues as outlined. History of IDA last year in the setting of blood donation. No overt bleeding. History of GERD. History of cecal polyp removed Jan 2019 - "Hyperplastic". IDA resolved with iron supplementation    Patient denies any bowel symptoms at this time. No family history of colon cancer known. Son does have Peutz Jehjer syndrome. Otherwise feels well without any cardiopulmonary symptoms.   I have discussed risks / benefits of anesthesia and endoscopic procedure with Fredirick Lathe and they wish to proceed with the exams as outlined today.    Past Medical History:  Diagnosis Date   Allergy    Anemia    labs ok now   Arthritis    Basal cell carcinoma    BASAL CELL ON NOSE   Depression    GAD (generalized anxiety disorder)    GERD (gastroesophageal reflux disease)    History of basal cell carcinoma (BCC) excision    nose  x2  last one 2019   Hyperlipidemia    Hyperplastic colon polyp    Knee pain, chronic    Migraine    Osteopenia    PMB (postmenopausal bleeding)    Seasonal allergies    Thickened endometrium    Wears glasses     Past Surgical History:  Procedure Laterality Date   APPENDECTOMY  76   age 16   BREAST LUMPECTOMY Right 2002   on HRT, fibromadenoma   CHOLECYSTECTOMY N/A 09/17/2013   Procedure: LAPAROSCOPIC CHOLECYSTECTOMY;  Surgeon: Almond Lint, MD;  Location: [facility];  Service: General;  Laterality: N/A;   COLONOSCOPY WITH PROPOFOL  last one 06-28-2017  dr Adela Lank   DILATATION & CURETTAGE/HYSTEROSCOPY WITH MYOSURE N/A 06/15/2020   Procedure: DILATATION & CURETTAGE/HYSTEROSCOPY WITH MYOSURE;  Surgeon: Toy Baker, DO;  Location: [facility];  Service:  Gynecology;  Laterality: N/A;   FRACTURE SURGERY     lt. ankle, rt. wrist   JOINT REPLACEMENT  10/2021   rt. knee   LAPAROSCOPIC LIVER CYST UNROOFING N/A 09/17/2013   Procedure: LAPAROSCOPIC LIVER CYST UNROOFING;  Surgeon: Almond Lint, MD;  Location: [facility];  Service: General;  Laterality: N/A;   MOHS SURGERY  x2  last one approx. 2019   bcc of nose   ORIF ANKLE FRACTURE Left 2000   ORIF WRIST FRACTURE Right 10-30-2016  @[facility]    sclerotherapy on left leg  2013   TOTAL KNEE ARTHROPLASTY Right 10/23/2021   Procedure: TOTAL KNEE ARTHROPLASTY;  Surgeon: Ollen Gross, MD;  Location: [facility];  Service: Orthopedics;  Laterality: Right;   TUBAL LIGATION  1982    Prior to Admission medications   Medication Sig Start Date End Date Taking? Authorizing Provider  Calcium Carb-Cholecalciferol (CALTRATE 600+D3 PO) Take 1 tablet by mouth daily.   Yes [provider]  cholecalciferol (VITAMIN D3) 25 MCG (1000 UNIT) tablet Take 1,000 Units by mouth daily.   Yes [provider]  ipratropium (ATROVENT) 0.03 % nasal spray Place 2 sprays into both nostrils 2 (two) times daily. SPRAY 2 SPRAYS INTO BOTH NOSTRILS EVERY 12 HOURS 06/01/22  Yes Agapito Games, MD  minoxidil (ROGAINE) 2 % external solution Apply 1 application. topically 3 (  three) times a week.   Yes [provider]  Multiple Vitamin (MULTIVITAMIN WITH MINERALS) TABS tablet Take 1 tablet by mouth every morning.   Yes [provider]  sertraline (ZOLOFT) 100 MG tablet TAKE 1 TABLET BY MOUTH EVERY DAY IN THE MORNING 06/01/22  Yes Agapito Games, MD  omeprazole (PRILOSEC) 20 MG capsule TAKE 1 CAPSULE BY MOUTH EVERY DAY Patient not taking: Reported on 10/01/2022 06/01/22   Agapito Games, MD  SUMAtriptan (IMITREX) 100 MG tablet May repeat in 2 hours if headache persists or recurs. 06/01/22   Agapito Games, MD    Current Outpatient Medications  Medication Sig Dispense Refill   Calcium  Carb-Cholecalciferol (CALTRATE 600+D3 PO) Take 1 tablet by mouth daily.     cholecalciferol (VITAMIN D3) 25 MCG (1000 UNIT) tablet Take 1,000 Units by mouth daily.     ipratropium (ATROVENT) 0.03 % nasal spray Place 2 sprays into both nostrils 2 (two) times daily. SPRAY 2 SPRAYS INTO BOTH NOSTRILS EVERY 12 HOURS 30 mL 11   minoxidil (ROGAINE) 2 % external solution Apply 1 application. topically 3 (three) times a week.     Multiple Vitamin (MULTIVITAMIN WITH MINERALS) TABS tablet Take 1 tablet by mouth every morning.     sertraline (ZOLOFT) 100 MG tablet TAKE 1 TABLET BY MOUTH EVERY DAY IN THE MORNING 90 tablet 1   omeprazole (PRILOSEC) 20 MG capsule TAKE 1 CAPSULE BY MOUTH EVERY DAY (Patient not taking: Reported on 10/01/2022) 90 capsule 1   SUMAtriptan (IMITREX) 100 MG tablet May repeat in 2 hours if headache persists or recurs. 9 tablet 6   Current Facility-Administered Medications  Medication Dose Route Frequency Provider Last Rate Last Admin   0.9 %  sodium chloride infusion  500 mL Intravenous Once Reighn Kaplan, [name], MD        Allergies as of 10/01/2022 - Review Complete 10/01/2022  Allergen Reaction Noted   Excedrin migraine [aspirin-acetaminophen-caffeine] Other (See Comments) 02/17/2013    Family History  Problem Relation Age of Onset   Brain cancer Mother 25   Cancer Mother    Diabetes Father    Hypertension Father    CAD Father    Heart disease Father    Colon polyps Son        Puetz-jehgers syndrome   Hypertension Son    Colon cancer Neg Hx    Esophageal cancer Neg Hx    Rectal cancer Neg Hx    Stomach cancer Neg Hx     Social History   Socioeconomic History   Marital status: Married    Spouse name: John    Number of children: 2   Years of education: 16   Highest education level: Associate degree: academic program  Occupational History   Occupation: Echo Retail buyer: [employer]    Comment: retired  Tobacco Use   Smoking status:  Never   Smokeless tobacco: Never  Building services engineer Use: Never used  Substance and Sexual Activity   Alcohol use: Yes    Comment: on special occasions only   Drug use: Never   Sexual activity: Not Currently    Partners: Male    Birth control/protection: Post-menopausal  Other Topics Concern   Not on file  Social History Narrative   Lives with her husband. Enjoys going to the gym.   Social Determinants of Health   Financial Resource Strain: Low Risk  (09/20/2022)   Overall Financial Resource Strain (CARDIA)  Difficulty of Paying Living Expenses: Not hard at all  Food Insecurity: No Food Insecurity (09/20/2022)   Hunger Vital Sign    Worried About Running Out of Food in the Last Year: Never true    Ran Out of Food in the Last Year: Never true  Transportation Needs: No Transportation Needs (09/20/2022)   PRAPARE - Administrator, Civil Service (Medical): No    Lack of Transportation (Non-Medical): No  Physical Activity: Insufficiently Active (09/20/2022)   Exercise Vital Sign    Days of Exercise per Week: 2 days    Minutes of Exercise per Session: 20 min  Stress: No Stress Concern Present (09/20/2022)   [employer] of Occupational Health - Occupational Stress Questionnaire    Feeling of Stress : Only a little  Social Connections: Moderately Integrated (09/24/2022)   Social Connection and Isolation Panel [NHANES]    Frequency of Communication with Friends and Family: Twice a week    Frequency of Social Gatherings with Friends and Family: Never    Attends Religious Services: More than 4 times per year    Active Member of [employer] or Organizations: Yes    Attends Engineer, structural: More than 4 times per year    Marital Status: Married  Catering manager Violence: Not At Risk (09/24/2022)   Humiliation, Afraid, Rape, and Kick questionnaire    Fear of Current or Ex-Partner: No    Emotionally Abused: No    Physically Abused: No    Sexually Abused: No     Review of Systems: All other review of systems negative except as mentioned in the HPI.  Physical Exam: Vital signs BP 115/68   Pulse 77   Temp 98.6 F (37 C) (Skin)   Ht 5' 9.25" (1.759 m)   Wt 191 lb (86.6 kg)   SpO2 97%   BMI 28.00 kg/m   General:   Alert,  Well-developed, pleasant and cooperative in NAD Lungs:  Clear throughout to auscultation.   Heart:  Regular rate and rhythm Abdomen:  Soft, nontender and nondistended.   Neuro/Psych:  Alert and cooperative. Normal mood and affect. A and O x 3  Harlin Rain, MD [facility]

## 2022-10-02 ENCOUNTER — Telehealth: Payer: Self-pay | Admitting: *Deleted

## 2022-10-02 NOTE — Telephone Encounter (Signed)
  Follow up Call-     10/01/2022    8:59 AM  Call back number  Post procedure Call Back phone  # [phone]  Permission to leave phone message Yes     Patient questions:  Do you have a fever, pain , or abdominal swelling? No. Pain Score  0 *  Have you tolerated food without any problems? Yes.    Have you been able to return to your normal activities? Yes.    Do you have any questions about your discharge instructions: Diet   No. Medications  No. Follow up visit  No.  Do you have questions or concerns about your Care? Yes.   Pt did report that the inside of her upper lip is a little sore, likely r/t the bite block used during the procedure. Pt was not concerned about this, just wanted to make [place] aware.   Actions: * If pain score is 4 or above: No action needed, pain <4.

## 2022-10-03 ENCOUNTER — Encounter: Payer: Self-pay | Admitting: Gastroenterology

## 2022-10-04 NOTE — Telephone Encounter (Signed)
25-month lab reminder in epic. Recommendations sent to patient via MyChart message.

## 2022-10-18 DIAGNOSIS — Z96651 Presence of right artificial knee joint: Secondary | ICD-10-CM | POA: Diagnosis not present

## 2022-10-24 DIAGNOSIS — M6281 Muscle weakness (generalized): Secondary | ICD-10-CM | POA: Diagnosis not present

## 2022-10-24 DIAGNOSIS — M25561 Pain in right knee: Secondary | ICD-10-CM | POA: Diagnosis not present

## 2022-10-24 DIAGNOSIS — R2681 Unsteadiness on feet: Secondary | ICD-10-CM | POA: Diagnosis not present

## 2022-10-30 DIAGNOSIS — M6281 Muscle weakness (generalized): Secondary | ICD-10-CM | POA: Diagnosis not present

## 2022-10-30 DIAGNOSIS — M25561 Pain in right knee: Secondary | ICD-10-CM | POA: Diagnosis not present

## 2022-10-30 DIAGNOSIS — R2681 Unsteadiness on feet: Secondary | ICD-10-CM | POA: Diagnosis not present

## 2022-11-01 DIAGNOSIS — M6281 Muscle weakness (generalized): Secondary | ICD-10-CM | POA: Diagnosis not present

## 2022-11-01 DIAGNOSIS — M25561 Pain in right knee: Secondary | ICD-10-CM | POA: Diagnosis not present

## 2022-11-01 DIAGNOSIS — R2681 Unsteadiness on feet: Secondary | ICD-10-CM | POA: Diagnosis not present

## 2022-11-05 DIAGNOSIS — M25561 Pain in right knee: Secondary | ICD-10-CM | POA: Diagnosis not present

## 2022-11-05 DIAGNOSIS — M6281 Muscle weakness (generalized): Secondary | ICD-10-CM | POA: Diagnosis not present

## 2022-11-05 DIAGNOSIS — R2681 Unsteadiness on feet: Secondary | ICD-10-CM | POA: Diagnosis not present

## 2022-11-08 DIAGNOSIS — R2681 Unsteadiness on feet: Secondary | ICD-10-CM | POA: Diagnosis not present

## 2022-11-08 DIAGNOSIS — M6281 Muscle weakness (generalized): Secondary | ICD-10-CM | POA: Diagnosis not present

## 2022-11-08 DIAGNOSIS — M25561 Pain in right knee: Secondary | ICD-10-CM | POA: Diagnosis not present

## 2022-11-12 DIAGNOSIS — R2681 Unsteadiness on feet: Secondary | ICD-10-CM | POA: Diagnosis not present

## 2022-11-12 DIAGNOSIS — M25561 Pain in right knee: Secondary | ICD-10-CM | POA: Diagnosis not present

## 2022-11-12 DIAGNOSIS — M6281 Muscle weakness (generalized): Secondary | ICD-10-CM | POA: Diagnosis not present

## 2022-11-19 ENCOUNTER — Other Ambulatory Visit: Payer: Medicare Other | Admitting: Pharmacist

## 2022-11-19 ENCOUNTER — Other Ambulatory Visit: Payer: Medicare Other

## 2022-11-19 ENCOUNTER — Telehealth: Payer: Medicare Other

## 2022-11-19 DIAGNOSIS — R2681 Unsteadiness on feet: Secondary | ICD-10-CM | POA: Diagnosis not present

## 2022-11-19 DIAGNOSIS — M25561 Pain in right knee: Secondary | ICD-10-CM | POA: Diagnosis not present

## 2022-11-19 DIAGNOSIS — M6281 Muscle weakness (generalized): Secondary | ICD-10-CM | POA: Diagnosis not present

## 2022-11-19 NOTE — Progress Notes (Unsigned)
   11/19/2022 Name: Lorraine Woods MRN: [id number] DOB: [birth year]  Chief Complaint  Patient presents with   Medication Assistance   Lorraine Woods is a 77 y.o. year old female who presented for a telephone visit for annual follow-up with [facility] PharmD   Subjective:  Care Team: Primary Care Provider: Agapito Games, MD ; Next Scheduled Visit: 12/03/22  Medication Access/Adherence Current Pharmacy:  [phone] - [place], [place] - [address] [place] [address] Phone: [phone] Fax: [phone]  -Patient reports affordability concerns with their medications: No  -Patient reports access/transportation concerns to their pharmacy: No  -Patient reports adherence concerns with their medications:  No    Hyperlipidemia/ASCVD Risk Reduction Current lipid lowering medications: None Antiplatelet regimen: None - Discussed risk vs benefit of statin for lipid lowering even with intermittent dosing regimens   Objective: Lab Results  Component Value Date   CHOL 205 (H) 06/01/2022   HDL 58 06/01/2022   LDLCALC 120 (H) 06/01/2022   TRIG 156 (H) 06/01/2022   CHOLHDL 3.5 06/01/2022   Medications Reviewed Today     Reviewed by Lenna Gilford, RPH (Pharmacist) on 11/19/22 at 1106  Med List Status: <None>   Medication Order Taking? Sig Documenting Provider Last Dose Status Informant  Calcium Carb-Cholecalciferol (CALTRATE 600+D3 PO) [id number] Yes Take 1 tablet by mouth daily. [provider] Taking Active Self  cholecalciferol (VITAMIN D3) 25 MCG (1000 UNIT) tablet [id number] Yes Take 1,000 Units by mouth daily. [provider] Taking Active Self  ipratropium (ATROVENT) 0.03 % nasal spray [id number] Yes Place 2 sprays into both nostrils 2 (two) times daily. SPRAY 2 SPRAYS INTO BOTH NOSTRILS EVERY 12 HOURS Agapito Games, MD Taking Active            Med Note Sharlee Blew Nov 19, 2022 11:04 AM) Uses as needed   minoxidil (ROGAINE) 2 % external solution [id number] Yes Apply 1 application. topically 3 (three) times a week. [provider] Taking Active Self  Multiple Vitamin (MULTIVITAMIN WITH MINERALS) TABS tablet [id number] Yes Take 1 tablet by mouth every morning. [provider] Taking Active Self           Med Note Sharlee Blew Nov 19, 2022 11:05 AM) Centrum Silver Women's  omeprazole (PRILOSEC) 20 MG capsule [id number] Yes TAKE 1 CAPSULE BY MOUTH EVERY DAY Agapito Games, MD Taking Active   sertraline (ZOLOFT) 100 MG tablet [id number] Yes TAKE 1 TABLET BY MOUTH EVERY DAY IN THE MORNING Agapito Games, MD Taking Active   SUMAtriptan (IMITREX) 100 MG tablet [id number] Yes May repeat in 2 hours if headache persists or recurs. Agapito Games, MD Taking Active            Assessment/Plan:   Medication Access/Adherence - No barriers to adherence, questions/concerns with medication therapies - Continue current regimen  Hyperlipidemia/ASCVD Risk Reduction: - Currently controlled.  - Reviewed long term complications of uncontrolled cholesterol - Recommend to continue consideration of statin therapy even at 3x/wk dosing and discus further at 7/1 visit with Dr. Linford Arnold   Follow Up Plan: Yearly check in unless needs arise- will notify Lynnda Shields, PharmD to schedule for next year upon her return  Lenna Gilford, PharmD, DPLA

## 2022-11-21 DIAGNOSIS — R2681 Unsteadiness on feet: Secondary | ICD-10-CM | POA: Diagnosis not present

## 2022-11-21 DIAGNOSIS — M25561 Pain in right knee: Secondary | ICD-10-CM | POA: Diagnosis not present

## 2022-11-21 DIAGNOSIS — M6281 Muscle weakness (generalized): Secondary | ICD-10-CM | POA: Diagnosis not present

## 2022-12-03 ENCOUNTER — Encounter: Payer: Self-pay | Admitting: Family Medicine

## 2022-12-03 ENCOUNTER — Ambulatory Visit (INDEPENDENT_AMBULATORY_CARE_PROVIDER_SITE_OTHER): Payer: Medicare Other | Admitting: Family Medicine

## 2022-12-03 VITALS — BP 113/53 | HR 69 | Ht 69.25 in | Wt 188.0 lb

## 2022-12-03 DIAGNOSIS — F411 Generalized anxiety disorder: Secondary | ICD-10-CM | POA: Diagnosis not present

## 2022-12-03 DIAGNOSIS — Z79899 Other long term (current) drug therapy: Secondary | ICD-10-CM

## 2022-12-03 DIAGNOSIS — G43009 Migraine without aura, not intractable, without status migrainosus: Secondary | ICD-10-CM | POA: Diagnosis not present

## 2022-12-03 DIAGNOSIS — M25562 Pain in left knee: Secondary | ICD-10-CM

## 2022-12-03 NOTE — Progress Notes (Signed)
Established Patient Office Visit  Subjective   Patient ID: Lorraine Woods, female    DOB: 16-Feb-1946  Age: 77 y.o. MRN: [id number]  Chief Complaint  Patient presents with   Migraine   mood   Knee Pain    L knee pain x 1 day    HPI F/U GAD -doing well on current regimen.  Some increase stressors and helping take care of her husband but otherwise happy with her current regimen.  Migraine HA -   Recently completed physical therapy for her right knee after surgery but yesterday when she stood up from her desk she started feeling some pain in her left knee going up into her distal quad.  She denies any known injury or trauma or twisting of the knee.  She did not fall.  She did do a little ice and Tylenol last night and was able to get comfortable enough to go to sleep but it still painful this morning.  So she has been wearing a compression sleeve on it today.  He did discontinue her iron after surgery.  And they are planning to recheck her levels again in a few months to make sure that she is maintaining.     ROS    Objective:     BP (!) 113/53   Pulse 69   Ht 5' 9.25" (1.759 m)   Wt 188 lb (85.3 kg)   SpO2 97%   BMI 27.56 kg/m    Physical Exam Vitals and nursing note reviewed.  Constitutional:      Appearance: She is well-developed.  HENT:     Head: Normocephalic and atraumatic.  Cardiovascular:     Rate and Rhythm: Normal rate and regular rhythm.     Heart sounds: Normal heart sounds.  Pulmonary:     Effort: Pulmonary effort is normal.     Breath sounds: Normal breath sounds.  Musculoskeletal:     Comments: Left knee with some swelling medially.  Nontender on exam.  She does have some pain when she tries to lift her left leg straight.  Normal hip, knee and ankle strength bilaterally.  Skin:    General: Skin is warm and dry.  Neurological:     Mental Status: She is alert and oriented to person, place, and time.  Psychiatric:        Behavior: Behavior normal.       No results found for any visits on 12/03/22.    The 10-year ASCVD risk score (Arnett DK, et al., 2019) is: 14.3%    Assessment & Plan:   Problem List Items Addressed This Visit       Cardiovascular and Mediastinum   Migraine without aura, not refractory - Primary    Continuing to use Imitrex as needed.      Relevant Orders   COMPLETE METABOLIC PANEL WITH GFR     Other   Generalized anxiety disorder    Her all feels like she is doing pretty well.  No specific concerns.  Happy with her current regimen.  Her husband has been ill lately so she has had to be in a much more caregiver role which has been a little bit stressful.      Relevant Orders   COMPLETE METABOLIC PANEL WITH GFR   Other Visit Diagnoses     Acute pain of left knee       Medication management       Relevant Orders   COMPLETE METABOLIC PANEL WITH GFR  Left knee Pain - Knee pain-recommend continuing with ice for 5 to 6 minutes 2-3 times a day. Okay to use Tylenol, ibuprofen or Aleve daily for the next week to see if that improves pain and discomfort as well elevate as needed. Tenure with compression. Not some improvement over the next week then please let me know and we can always get you back in with the orthopedist for further workup. Given handout for stretches to do on her own at home for quadricep strain as well.  Return in about 6 months (around 06/05/2023) for Medications and mood.   I spent 25 minutes on the day of the encounter to include pre-visit record review, face-to-face time with the patient and post visit ordering of test.  Nani Gasser, MD

## 2022-12-03 NOTE — Patient Instructions (Signed)
Knee pain-recommend continuing with ice for 5 to 6 minutes 2-3 times a day. Okay to use Tylenol, ibuprofen or Aleve daily for the next week to see if that improves pain and discomfort as well elevate as needed. Tenure with compression. Not some improvement over the next week then please let me know and we can always get you back in with the orthopedist for further workup.

## 2022-12-03 NOTE — Assessment & Plan Note (Signed)
Her all feels like she is doing pretty well.  No specific concerns.  Happy with her current regimen.  Her husband has been ill lately so she has had to be in a much more caregiver role which has been a little bit stressful.

## 2022-12-03 NOTE — Assessment & Plan Note (Signed)
Continuing to use Imitrex as needed.

## 2022-12-04 LAB — COMPLETE METABOLIC PANEL WITH GFR
AG Ratio: 1.7 (calc) (ref 1.0–2.5)
ALT: 13 U/L (ref 6–29)
AST: 17 U/L (ref 10–35)
Albumin: 4.3 g/dL (ref 3.6–5.1)
Alkaline phosphatase (APISO): 45 U/L (ref 37–153)
BUN: 14 mg/dL (ref 7–25)
CO2: 31 mmol/L (ref 20–32)
Calcium: 9.3 mg/dL (ref 8.6–10.4)
Chloride: 105 mmol/L (ref 98–110)
Creat: 0.73 mg/dL (ref 0.60–1.00)
Globulin: 2.6 g/dL (calc) (ref 1.9–3.7)
Glucose, Bld: 102 mg/dL — ABNORMAL HIGH (ref 65–99)
Potassium: 4.1 mmol/L (ref 3.5–5.3)
Sodium: 142 mmol/L (ref 135–146)
Total Bilirubin: 0.8 mg/dL (ref 0.2–1.2)
Total Protein: 6.9 g/dL (ref 6.1–8.1)
eGFR: 85 mL/min/{1.73_m2} (ref >=60)

## 2022-12-04 NOTE — Progress Notes (Signed)
Your lab work is within acceptable range and there are no concerning findings.   ?

## 2022-12-12 DIAGNOSIS — M25561 Pain in right knee: Secondary | ICD-10-CM | POA: Diagnosis not present

## 2022-12-12 DIAGNOSIS — R2681 Unsteadiness on feet: Secondary | ICD-10-CM | POA: Diagnosis not present

## 2022-12-12 DIAGNOSIS — M6281 Muscle weakness (generalized): Secondary | ICD-10-CM | POA: Diagnosis not present

## 2022-12-24 DIAGNOSIS — Z85828 Personal history of other malignant neoplasm of skin: Secondary | ICD-10-CM | POA: Diagnosis not present

## 2022-12-24 DIAGNOSIS — L821 Other seborrheic keratosis: Secondary | ICD-10-CM | POA: Diagnosis not present

## 2022-12-24 DIAGNOSIS — Z08 Encounter for follow-up examination after completed treatment for malignant neoplasm: Secondary | ICD-10-CM | POA: Diagnosis not present

## 2022-12-24 DIAGNOSIS — L720 Epidermal cyst: Secondary | ICD-10-CM | POA: Diagnosis not present

## 2022-12-24 DIAGNOSIS — B351 Tinea unguium: Secondary | ICD-10-CM | POA: Diagnosis not present

## 2022-12-24 DIAGNOSIS — D1801 Hemangioma of skin and subcutaneous tissue: Secondary | ICD-10-CM | POA: Diagnosis not present

## 2022-12-24 DIAGNOSIS — L814 Other melanin hyperpigmentation: Secondary | ICD-10-CM | POA: Diagnosis not present

## 2023-02-05 ENCOUNTER — Telehealth: Payer: Self-pay

## 2023-02-05 DIAGNOSIS — D509 Iron deficiency anemia, unspecified: Secondary | ICD-10-CM

## 2023-02-05 NOTE — Telephone Encounter (Signed)
-----   Message from Nurse [place] P sent at 10/04/2022  4:49 PM EDT ----- Regarding: 3-month labs CBC and IBC / ferritin panel due - need to enter order

## 2023-02-05 NOTE — Telephone Encounter (Signed)
Lab orders in epic. MyChart message sent to patient with lab reminder.

## 2023-02-07 ENCOUNTER — Other Ambulatory Visit (INDEPENDENT_AMBULATORY_CARE_PROVIDER_SITE_OTHER): Payer: Medicare Other

## 2023-02-07 DIAGNOSIS — D509 Iron deficiency anemia, unspecified: Secondary | ICD-10-CM

## 2023-02-07 LAB — CBC WITH DIFFERENTIAL/PLATELET
Basophils Absolute: 0 10*3/uL (ref 0.0–0.1)
Basophils Relative: 0.3 % (ref 0.0–3.0)
Eosinophils Absolute: 0.1 10*3/uL (ref 0.0–0.7)
Eosinophils Relative: 1.5 % (ref 0.0–5.0)
HCT: 43.1 % (ref 36.0–46.0)
Hemoglobin: 14.1 g/dL (ref 12.0–15.0)
Lymphocytes Relative: 22.3 % (ref 12.0–46.0)
Lymphs Abs: 1.5 10*3/uL (ref 0.7–4.0)
MCHC: 32.7 g/dL (ref 30.0–36.0)
MCV: 87.2 fl (ref 78.0–100.0)
Monocytes Absolute: 0.4 10*3/uL (ref 0.1–1.0)
Monocytes Relative: 5.7 % (ref 3.0–12.0)
Neutro Abs: 4.6 10*3/uL (ref 1.4–7.7)
Neutrophils Relative %: 70.2 % (ref 43.0–77.0)
Platelets: 218 10*3/uL (ref 150.0–400.0)
RBC: 4.95 Mil/uL (ref 3.87–5.11)
RDW: 13 % (ref 11.5–15.5)
WBC: 6.5 10*3/uL (ref 4.0–10.5)

## 2023-02-07 LAB — IBC + FERRITIN
Ferritin: 50 ng/mL (ref 10.0–291.0)
Iron: 53 ug/dL (ref 42–145)
Saturation Ratios: 18 % — ABNORMAL LOW (ref 20.0–50.0)
TIBC: 294 ug/dL (ref 250.0–450.0)
Transferrin: 210 mg/dL — ABNORMAL LOW (ref 212.0–360.0)

## 2023-02-08 ENCOUNTER — Other Ambulatory Visit: Payer: Self-pay | Admitting: Gastroenterology

## 2023-02-08 DIAGNOSIS — D509 Iron deficiency anemia, unspecified: Secondary | ICD-10-CM

## 2023-03-05 DIAGNOSIS — H5203 Hypermetropia, bilateral: Secondary | ICD-10-CM | POA: Diagnosis not present

## 2023-03-05 DIAGNOSIS — H43813 Vitreous degeneration, bilateral: Secondary | ICD-10-CM | POA: Diagnosis not present

## 2023-03-05 DIAGNOSIS — H2513 Age-related nuclear cataract, bilateral: Secondary | ICD-10-CM | POA: Diagnosis not present

## 2023-03-09 ENCOUNTER — Other Ambulatory Visit: Payer: Self-pay | Admitting: Family Medicine

## 2023-03-09 DIAGNOSIS — F411 Generalized anxiety disorder: Secondary | ICD-10-CM

## 2023-03-09 DIAGNOSIS — K2100 Gastro-esophageal reflux disease with esophagitis, without bleeding: Secondary | ICD-10-CM

## 2023-03-11 DIAGNOSIS — Z23 Encounter for immunization: Secondary | ICD-10-CM | POA: Diagnosis not present

## 2023-04-17 ENCOUNTER — Ambulatory Visit (INDEPENDENT_AMBULATORY_CARE_PROVIDER_SITE_OTHER): Payer: Medicare Other | Admitting: Gastroenterology

## 2023-04-17 ENCOUNTER — Encounter: Payer: Self-pay | Admitting: Gastroenterology

## 2023-04-17 VITALS — BP 120/70 | HR 73 | Ht 69.25 in | Wt 186.4 lb

## 2023-04-17 DIAGNOSIS — K2270 Barrett's esophagus without dysplasia: Secondary | ICD-10-CM | POA: Diagnosis not present

## 2023-04-17 DIAGNOSIS — K2100 Gastro-esophageal reflux disease with esophagitis, without bleeding: Secondary | ICD-10-CM | POA: Diagnosis not present

## 2023-04-17 DIAGNOSIS — Z860100 Personal history of colon polyps, unspecified: Secondary | ICD-10-CM | POA: Diagnosis not present

## 2023-04-17 DIAGNOSIS — Z79899 Other long term (current) drug therapy: Secondary | ICD-10-CM | POA: Diagnosis not present

## 2023-04-17 DIAGNOSIS — R194 Change in bowel habit: Secondary | ICD-10-CM | POA: Diagnosis not present

## 2023-04-17 DIAGNOSIS — D509 Iron deficiency anemia, unspecified: Secondary | ICD-10-CM

## 2023-04-17 MED ORDER — OMEPRAZOLE 20 MG PO CPDR
DELAYED_RELEASE_CAPSULE | ORAL | 3 refills | Status: AC
Start: 2023-04-17 — End: ?

## 2023-04-17 MED ORDER — CITRUCEL PO POWD: 1.0000 | Freq: Every day | ORAL | Status: AC

## 2023-04-17 NOTE — Patient Instructions (Addendum)
We have sent the following medications to your pharmacy for you to pick up at your convenience: Omeprazole 20 mg: Take once daily  You will be due for labs in March.  You will be due for a recall Endoscopy in 09-2027. We will send you a reminder in the mail when it gets closer to that time.  Please purchase the following medications over the counter and take as directed: Citrucel - use once daily  Please follow up in 18 months.  Thank you for entrusting me with your care and for choosing [facility], Dr. Ileene Patrick  If your blood pressure at your visit was 140/90 or greater, please contact your primary care physician to follow up on this. ______________________________________________________  If you are age 14 or older, your body mass index should be between 23-30. Your Body mass index is 27.32 kg/m. If this is out of the aforementioned range listed, please consider follow up with your Primary Care Provider.  If you are age 19 or younger, your body mass index should be between 19-25. Your Body mass index is 27.32 kg/m. If this is out of the aformentioned range listed, please consider follow up with your Primary Care Provider.  ________________________________________________________  The [facility] providers would like to encourage you to use [facility] to communicate with providers for non-urgent requests or questions.  Due to long hold times on the telephone, sending your provider a message by [facility] may be a faster and more efficient way to get a response.  Please allow 48 business hours for a response.  Please remember that this is for non-urgent requests.  _______________________________________________________  Due to recent changes in healthcare laws, you may see the results of your imaging and laboratory studies on MyChart before your provider has had a chance to review them.  We understand that in some cases there may be results that are confusing or concerning to  you. Not all laboratory results come back in the same time frame and the provider may be waiting for multiple results in order to interpret others.  Please give [place] 48 hours in order for your provider to thoroughly review all the results before contacting the office for clarification of your results.

## 2023-04-17 NOTE — Progress Notes (Signed)
HPI :  77 year old female here for follow-up visit.  Recall she has a history of iron deficiency anemia back in 2023.  Around that time it sounded like she was donating blood, usually 3 times yearly.  She was referred for EGD and colonoscopy which she had with me in April 2024.  These procedure results are as outlined below.  There was no cause for iron deficiency on colonoscopy.  Few small polyps removed, one adenomatous.  EGD showed some gastritis, biopsies negative for H. pylori.  She did have a short segment of Barrett's esophagus which was newly diagnosed and started on omeprazole once daily.  Since that time she is been compliant with taking omeprazole 20 mg daily.  She typically will not have much of any reflux on the regimen, she very rarely may have some breakthrough for which she takes Tums.  Prior to the endoscopy she was using omeprazole as needed but not taking it much at all.  She denies any dysphagia.  She otherwise is eating okay.  She does have a history of osteopenia, and some fractures in the past.  She takes vitamin D supplementation with calcium every day.  She had follow-up labs in September which showed resolution of her anemia, iron studies looked okay.  She remains off of iron and has been holding off on blood donation.  We had plan to repeat her labs in 6 months.  She states when she was on iron her bowels changed and regards to stool form and frequency.  She states that has not really improved and she has some days where she is constipated and other days where she has more loose stools and they can alternate back-and-forth.  She is not using any NSAIDs.  Using Tylenol as needed.   Colonoscopy 06/28/2017:  Path report showed a hyperplastic polyp, recall colonoscopy in 5 years   EGD 10/01/22: - Esophagogastric landmarks were identified: the Z-line was found at 38 cm, the gastroesophageal junction was found at 39 cm and the upper extent of the gastric folds was found at 40  cm from the incisors. Findings: - A 1 cm hiatal hernia was present. - The Z-line was irregular with 2 tongues of salmon colored mucosa about 1 cm in length. Biopsies were taken with a cold forceps for histology. - The exam of the esophagus was otherwise normal. - Patchy inflammation characterized by adherent blood, erythema and friability was found in the gastric body and in the gastric antrum. No focal ulcerations - The exam of the stomach was otherwise normal. - Biopsies were taken with a cold forceps for Helicobacter pylori testing. - The examined duodenum was normal.   Colonoscopy 10/01/22: - The perianal and digital rectal examinations were normal. - The terminal ileum appeared normal. - Two sessile polyps were found in the sigmoid colon. The polyps were 3 to 4 mm in size. These polyps were removed with a cold snare. Resection and retrieval were complete. - Multiple small-mouthed diverticula were found in the sigmoid colon. - Internal hemorrhoids were found during retroflexion. - The exam was otherwise without abnormality.   1. Surgical [P], gastric antrum and gastric body GASTRIC ANTRAL/OXYNTIC MUCOSA WITHOUT SIGNIFICANT DIAGNOSTIC ALTERATION. NO H.PYLORI IDENTIFIED ON H&E. 2. Surgical [P], distal esophagus SQUAMOCOLUMNAR MUCOSA WITH INTESTINAL METAPLASIA. NEGATIVE FOR DYSPLASIA. SEE COMMENT. 3. Surgical [P], colon, sigmoid, polyp (2) FRAGMENTS OF TUBULAR ADENOMA. FRAGMENT OF HYPERPLASTIC POLYP. NEGATIVE FOR HIGH-GRADE DYSPLASIA.   Lab Results  Component Value Date   WBC 6.5 02/07/2023  HGB 14.1 02/07/2023   HCT 43.1 02/07/2023   MCV 87.2 02/07/2023   PLT 218.0 02/07/2023    Lab Results  Component Value Date   IRON 53 02/07/2023   TIBC 294.0 02/07/2023   FERRITIN 50.0 02/07/2023     Past Medical History:  Diagnosis Date   Allergy    Anemia    labs ok now   Arthritis    Basal cell carcinoma    BASAL CELL ON NOSE   Depression    GAD (generalized anxiety disorder)     GERD (gastroesophageal reflux disease)    History of basal cell carcinoma (BCC) excision    nose  x2  last one 2019   Hyperlipidemia    Hyperplastic colon polyp    IDA (iron deficiency anemia)    Knee pain, chronic    Migraine    Osteopenia    PMB (postmenopausal bleeding)    Seasonal allergies    Thickened endometrium    Wears glasses      Past Surgical History:  Procedure Laterality Date   APPENDECTOMY  1959   age 46   BREAST LUMPECTOMY Right 2002   on HRT, fibromadenoma   CHOLECYSTECTOMY N/A 09/17/2013   Procedure: LAPAROSCOPIC CHOLECYSTECTOMY;  Surgeon: Almond Lint, MD;  Location: [facility];  Service: General;  Laterality: N/A;   COLONOSCOPY WITH PROPOFOL  last one 06-28-2017  dr Adela Lank   DILATATION & CURETTAGE/HYSTEROSCOPY WITH MYOSURE N/A 06/15/2020   Procedure: DILATATION & CURETTAGE/HYSTEROSCOPY WITH MYOSURE;  Surgeon: Toy Baker, DO;  Location: [facility];  Service: Gynecology;  Laterality: N/A;   FRACTURE SURGERY     lt. ankle, rt. wrist   JOINT REPLACEMENT  10/2021   rt. knee   LAPAROSCOPIC LIVER CYST UNROOFING N/A 09/17/2013   Procedure: LAPAROSCOPIC LIVER CYST UNROOFING;  Surgeon: Almond Lint, MD;  Location: [facility];  Service: General;  Laterality: N/A;   MOHS SURGERY  x2  last one approx. 2019   bcc of nose   ORIF ANKLE FRACTURE Left 2000   ORIF WRIST FRACTURE Right 10-30-2016  @[facility]    sclerotherapy on left leg  2013   TOTAL KNEE ARTHROPLASTY Right 10/23/2021   Procedure: TOTAL KNEE ARTHROPLASTY;  Surgeon: Ollen Gross, MD;  Location: [facility];  Service: Orthopedics;  Laterality: Right;   TUBAL LIGATION  1982   Family History  Problem Relation Age of Onset   Brain cancer Mother 27   Cancer Mother    Diabetes Father    Hypertension Father    CAD Father    Heart disease Father    Colon polyps Son        Puetz-jehgers syndrome   Hypertension Son    Colon cancer Neg Hx    Esophageal cancer Neg Hx    Rectal cancer Neg Hx     Stomach cancer Neg Hx    Social History   Tobacco Use   Smoking status: Never   Smokeless tobacco: Never  Vaping Use   Vaping status: Never Used  Substance Use Topics   Alcohol use: Yes    Comment: on special occasions only   Drug use: Never   Current Outpatient Medications  Medication Sig Dispense Refill   Calcium Carb-Cholecalciferol (CALTRATE 600+D3 PO) Take 1 tablet by mouth daily.     cholecalciferol (VITAMIN D3) 25 MCG (1000 UNIT) tablet Take 1,000 Units by mouth daily.     ipratropium (ATROVENT) 0.03 % nasal spray Place 2 sprays into both nostrils 2 (two) times daily.  SPRAY 2 SPRAYS INTO BOTH NOSTRILS EVERY 12 HOURS 30 mL 11   minoxidil (ROGAINE) 2 % external solution Apply 1 application. topically 3 (three) times a week.     Multiple Vitamin (MULTIVITAMIN WITH MINERALS) TABS tablet Take 1 tablet by mouth every morning.     sertraline (ZOLOFT) 100 MG tablet TAKE 1 TABLET BY MOUTH EVERY DAY IN THE MORNING 90 tablet 1   SUMAtriptan (IMITREX) 100 MG tablet May repeat in 2 hours if headache persists or recurs. 9 tablet 6   omeprazole (PRILOSEC) 20 MG capsule TAKE 1 CAPSULE BY MOUTH EVERY DAY 90 capsule 3   No current facility-administered medications for this visit.   Allergies  Allergen Reactions   Excedrin Migraine [Aspirin-Acetaminophen-Caffeine] Other (See Comments)    Bruising      Review of Systems: All systems reviewed and negative except where noted in HPI.   Lab Results  Component Value Date   WBC 6.5 02/07/2023   HGB 14.1 02/07/2023   HCT 43.1 02/07/2023   MCV 87.2 02/07/2023   PLT 218.0 02/07/2023    Lab Results  Component Value Date   NA 142 12/03/2022   CL 105 12/03/2022   K 4.1 12/03/2022   CO2 31 12/03/2022   BUN 14 12/03/2022   CREATININE 0.73 12/03/2022   EGFR 85 12/03/2022   CALCIUM 9.3 12/03/2022   PHOS 3.7 09/18/2013   ALBUMIN 4.1 10/12/2021   GLUCOSE 102 (H) 12/03/2022    Lab Results  Component Value Date   ALT 13 12/03/2022    AST 17 12/03/2022   ALKPHOS 47 10/12/2021   BILITOT 0.8 12/03/2022     Physical Exam: BP 120/70 (BP Location: Left Arm, Patient Position: Sitting, Cuff Size: Normal)   Pulse 73   Ht 5' 9.25" (1.759 m)   Wt 186 lb 6 oz (84.5 kg)   SpO2 96%   BMI 27.32 kg/m  Constitutional: Pleasant,well-developed, female in no acute distress. Neurological: Alert and oriented to person place and time. Psychiatric: Normal mood and affect. Behavior is normal.   ASSESSMENT: 77 y.o. female here for assessment of the following  1. Barrett's esophagus without dysplasia   2. Long-term current use of proton pump inhibitor therapy   3. Iron deficiency anemia, unspecified iron deficiency anemia type   4. Altered bowel habits   5. History of colon polyps   6. Gastroesophageal reflux disease with esophagitis without hemorrhage    Newly diagnosed short segment of Barrett's esophagus without dysplasia on EGD.  She does have a history of GERD using intermittent PPI in the past.  She has been on omeprazole daily since that time and tolerating it well.  She really does not want to have any significant breakthrough on a routine basis.  We discussed Barrett's esophagus, long-term risks for esophageal cancer, over that risk is quite low given short segment.  I recommend indefinite omeprazole/PPI use given this finding to help reduce the risk for esophageal cancer.  She agrees with this.  We will consider an EGD in 5 years for surveillance of this, at that time she will be 77 years old, pending on the status of her health will determine if she wants another exam or not.  We otherwise spent some time discussing the long-term risks of chronic PPI use.  She understands risks of fracture are increased on the regimen, she does have a history of osteopenia, needs to make sure she has that followed closely by her PCP, taking vitamin D and calcium.  Otherwise iron deficiency appears to have resolved, perhaps related to blood  donation plus minus component of gastritis.  She has maintained off NSAIDs.  Labs look better in September, we will repeat in March.  She is otherwise having some alternating constipated and loose stools ongoing for some time.  Discussed options, recommend daily fiber supplement such as Citrucel once daily.  If no improvement she will contact me.  Otherwise I do not think she needs further surveillance colonoscopy given polyps on her last exam are quite small.   PLAN: - counseled on Barrett's esophagus and risks - continue omeprazole 20mg  / day - refilled - counseled on long term risks of chronic PPI use. She does have osteopenia and continue regimen for that, ensure DEXA UTD - recall EGD April 2029 if she is up for it at that age - lab in March - CBC, TIBC / ferritin - resolved on last check - start Citrucel once daily OTC - follow up 12-18 months  Harlin Rain, MD [facility]

## 2023-05-20 ENCOUNTER — Ambulatory Visit (INDEPENDENT_AMBULATORY_CARE_PROVIDER_SITE_OTHER): Payer: Medicare Other | Admitting: Family Medicine

## 2023-05-20 ENCOUNTER — Encounter: Payer: Self-pay | Admitting: Family Medicine

## 2023-05-20 VITALS — BP 113/44 | HR 69 | Ht 70.0 in | Wt 186.2 lb

## 2023-05-20 DIAGNOSIS — G43009 Migraine without aura, not intractable, without status migrainosus: Secondary | ICD-10-CM | POA: Diagnosis not present

## 2023-05-20 DIAGNOSIS — R232 Flushing: Secondary | ICD-10-CM | POA: Diagnosis not present

## 2023-05-20 DIAGNOSIS — N644 Mastodynia: Secondary | ICD-10-CM | POA: Diagnosis not present

## 2023-05-20 DIAGNOSIS — E785 Hyperlipidemia, unspecified: Secondary | ICD-10-CM

## 2023-05-20 DIAGNOSIS — K2100 Gastro-esophageal reflux disease with esophagitis, without bleeding: Secondary | ICD-10-CM

## 2023-05-20 DIAGNOSIS — F411 Generalized anxiety disorder: Secondary | ICD-10-CM

## 2023-05-20 DIAGNOSIS — K2270 Barrett's esophagus without dysplasia: Secondary | ICD-10-CM | POA: Diagnosis not present

## 2023-05-20 DIAGNOSIS — M79622 Pain in left upper arm: Secondary | ICD-10-CM

## 2023-05-20 MED ORDER — SUMATRIPTAN SUCCINATE 100 MG PO TABS: ORAL_TABLET | ORAL | 6 refills | Status: DC

## 2023-05-20 NOTE — Progress Notes (Signed)
Established Patient Office Visit  Subjective  Patient ID: Lorraine Woods, female    DOB: 08-28-1945  Age: 77 y.o. MRN: [id number]  Chief Complaint  Patient presents with   mood follow up visit    HPI  For follow-up mood/generalized anxiety disorder currently on sertraline 100 mg daily.  She is happy with her current dose and current regimen she has had some stressors recently mostly in regards to her husband he recently flipped his truck and she really feels like he probably should not be driving he has some vision issues but they can address it with his eye doctor coming up soon.  Migraine headaches-currently uses Imitrex as needed.  Not currently on any prophylaxis.  Still getting a couple of migraines per month.  GERD-on omeprazole daily. Follows with Dr Adela Lank.  She has a diagnosis of Barrett's esophagus.  She complains of some pain underneath that left axilla occasionally radiating into the lateral upper breast area she says it is just intermittent no specific trigger or injury or trauma.  Is been going on for about a month.  Her last mammogram was in April 2024 at [facility].  Reports hot flashes that have been occurring mostly over her face and upper arms for the last 1 to 2 months.  She says it happens about 3-4 times a week and typically last about 4 to 5 minutes when it occurs.  She says it almost feels like hot flashes when she was going through menopause.  She denies any new medications or supplements niacin etc.    ROS    Objective:     BP (!) 113/44 (BP Location: Left Arm, Patient Position: Sitting, Cuff Size: Normal)   Pulse 69   Ht 5\' 10"  (1.778 m)   Wt 186 lb 4 oz (84.5 kg)   SpO2 99%   BMI 26.72 kg/m    Physical Exam Vitals and nursing note reviewed.  Constitutional:      Appearance: Normal appearance.  HENT:     Head: Normocephalic and atraumatic.  Eyes:     Conjunctiva/sclera: Conjunctivae normal.  Cardiovascular:     Rate and Rhythm: Normal  rate and regular rhythm.  Pulmonary:     Effort: Pulmonary effort is normal.     Breath sounds: Normal breath sounds.  Skin:    General: Skin is warm and dry.  Neurological:     Mental Status: She is alert.  Psychiatric:        Mood and Affect: Mood normal.      No results found for any visits on 05/20/23.    The 10-year ASCVD risk score (Arnett DK, et al., 2019) is: 14.3%    Assessment & Plan:   Problem List Items Addressed This Visit       Cardiovascular and Mediastinum   Migraine without aura, not refractory   Continue Imitrex as needed looks like it was last refilled in December of last year so all her prescription should be expiring so I will send over refills today.      Relevant Medications   SUMAtriptan (IMITREX) 100 MG tablet   Other Relevant Orders   CMP14+EGFR   Lipid panel   CBC   TSH     Digestive   GERD (gastroesophageal reflux disease)   On a PPI chronically for Barrett's esophagus.      Relevant Orders   CMP14+EGFR   Lipid panel   CBC   TSH   Barrett esophagus  Other   Hyperlipidemia   Relevant Orders   Lipid panel   Generalized anxiety disorder - Primary   Relevant Orders   CMP14+EGFR   Lipid panel   CBC   TSH   Other Visit Diagnoses       Hot flashes       Relevant Orders   CMP14+EGFR   Lipid panel   CBC   TSH     Left axillary pain       Relevant Orders   [place] LIMITED ULTRASOUND INCLUDING AXILLA LEFT BREAST    MM 3D DIAGNOSTIC MAMMOGRAM UNILATERAL LEFT BREAST     Breast pain, left       Relevant Orders   [place] LIMITED ULTRASOUND INCLUDING AXILLA LEFT BREAST    MM 3D DIAGNOSTIC MAMMOGRAM UNILATERAL LEFT BREAST      Left axillary pain that radiates into the breast area-her mammogram is up-to-date and was performed in April at [facility].  Will go ahead and get diagnostic imaging including ultrasound.  Order placed and faxed today.  Hot flashes-unclear etiology will check thyroid level we will also check for anemia.  Blood  pressure controlled today if anything her diastolic is little bit on the lower side.  Return in about 6 months (around 11/18/2023) for Mood.    Nani Gasser, MD

## 2023-05-20 NOTE — Assessment & Plan Note (Signed)
On a PPI chronically for Barrett's esophagus.

## 2023-05-20 NOTE — Assessment & Plan Note (Signed)
Continue Imitrex as needed looks like it was last refilled in December of last year so all her prescription should be expiring so I will send over refills today.

## 2023-05-21 ENCOUNTER — Encounter: Payer: Self-pay | Admitting: Family Medicine

## 2023-05-21 LAB — CMP14+EGFR
ALT: 14 [IU]/L (ref 0–32)
AST: 21 [IU]/L (ref 0–40)
Albumin: 4.3 g/dL (ref 3.8–4.8)
Alkaline Phosphatase: 65 [IU]/L (ref 44–121)
BUN/Creatinine Ratio: 22 (ref 12–28)
BUN: 15 mg/dL (ref 8–27)
Bilirubin Total: 0.4 mg/dL (ref 0.0–1.2)
CO2: 21 mmol/L (ref 20–29)
Calcium: 9.1 mg/dL (ref 8.7–10.3)
Chloride: 105 mmol/L (ref 96–106)
Creatinine, Ser: 0.67 mg/dL (ref 0.57–1.00)
Globulin, Total: 2.6 g/dL (ref 1.5–4.5)
Glucose: 101 mg/dL — ABNORMAL HIGH (ref 70–99)
Potassium: 4.2 mmol/L (ref 3.5–5.2)
Sodium: 145 mmol/L — ABNORMAL HIGH (ref 134–144)
Total Protein: 6.9 g/dL (ref 6.0–8.5)
eGFR: 91 mL/min/{1.73_m2} (ref >59)

## 2023-05-21 LAB — CBC
Hematocrit: 45.9 % (ref 34.0–46.6)
Hemoglobin: 15.1 g/dL (ref 11.1–15.9)
MCH: 29 pg (ref 26.6–33.0)
MCHC: 32.9 g/dL (ref 31.5–35.7)
MCV: 88 fL (ref 79–97)
Platelets: 236 10*3/uL (ref 150–450)
RBC: 5.21 x10E6/uL (ref 3.77–5.28)
RDW: 12.6 % (ref 11.7–15.4)
WBC: 4.7 10*3/uL (ref 3.4–10.8)

## 2023-05-21 LAB — LIPID PANEL
Chol/HDL Ratio: 3.5 {ratio} (ref 0.0–4.4)
Cholesterol, Total: 200 mg/dL — ABNORMAL HIGH (ref 100–199)
HDL: 57 mg/dL (ref >39)
LDL Chol Calc (NIH): 127 mg/dL — ABNORMAL HIGH (ref 0–99)
Triglycerides: 87 mg/dL (ref 0–149)
VLDL Cholesterol Cal: 16 mg/dL (ref 5–40)

## 2023-05-21 LAB — TSH: TSH: 1.6 u[IU]/mL (ref 0.450–4.500)

## 2023-05-21 MED ORDER — ROSUVASTATIN CALCIUM 10 MG PO TABS: 10.0000 mg | ORAL_TABLET | Freq: Every day | ORAL | 3 refills | Status: DC

## 2023-05-21 NOTE — Progress Notes (Signed)
Hi Totiana, sodium level is just a little borderline elevated.  Not in a worrisome range.  Will keep an eye on that.  Liver and kidney function are normal.  LDL cholesterol is mildly elevated continue to work on healthy diet and regular exercise.  Also your 10-year cardiovascular risk score is 14% which means you are high enough risk that you should consider taking a statin.  Anything 10% or higher we highly recommend a statin to reduce risk for heart attack and stroke.  Blood count is normal no sign of anemia.  Thyroid looks great.  Okay with starting a statin please let us know we would have you take the medication for 3 months and then recheck your levels at that time.  The 10-year ASCVD risk score (Arnett DK, et al., 2019) is: 14.3%   Values used to calculate the score:     Age: 76 years     Sex: Female     Is Non-Hispanic African American: No     Diabetic: No     Tobacco smoker: No     Systolic Blood Pressure: 113 mmHg     Is BP treated: No     HDL Cholesterol: 57 mg/dL     Total Cholesterol: 200 mg/dL

## 2023-06-06 ENCOUNTER — Encounter: Payer: Self-pay | Admitting: Family Medicine

## 2023-06-06 DIAGNOSIS — N644 Mastodynia: Secondary | ICD-10-CM | POA: Diagnosis not present

## 2023-06-06 LAB — HM MAMMOGRAPHY

## 2023-06-07 ENCOUNTER — Encounter: Payer: Self-pay | Admitting: Family Medicine

## 2023-07-19 ENCOUNTER — Other Ambulatory Visit: Payer: Self-pay | Admitting: Family Medicine

## 2023-07-19 DIAGNOSIS — G43009 Migraine without aura, not intractable, without status migrainosus: Secondary | ICD-10-CM

## 2023-07-22 ENCOUNTER — Other Ambulatory Visit: Payer: Self-pay | Admitting: Family Medicine

## 2023-07-22 DIAGNOSIS — F411 Generalized anxiety disorder: Secondary | ICD-10-CM

## 2023-07-31 ENCOUNTER — Telehealth: Payer: Self-pay

## 2023-07-31 NOTE — Telephone Encounter (Signed)
-----   Message from [facility] J sent at 02/08/2023  2:57 PM EDT ----- Dr Adela Lank would like this patient to come for a CBC, TIBC and ferritin level around 08/07/2023. Orders in epic. Please call and remind her to come. Thanks.

## 2023-07-31 NOTE — Telephone Encounter (Signed)
 Called and LM for patient to go to the lab one day next week.

## 2023-08-05 ENCOUNTER — Encounter: Payer: Self-pay | Admitting: Gastroenterology

## 2023-08-05 ENCOUNTER — Other Ambulatory Visit (INDEPENDENT_AMBULATORY_CARE_PROVIDER_SITE_OTHER)

## 2023-08-05 DIAGNOSIS — D509 Iron deficiency anemia, unspecified: Secondary | ICD-10-CM

## 2023-08-05 LAB — CBC WITH DIFFERENTIAL/PLATELET
Basophils Absolute: 0 10*3/uL (ref 0.0–0.1)
Basophils Relative: 0.6 % (ref 0.0–3.0)
Eosinophils Absolute: 0.1 10*3/uL (ref 0.0–0.7)
Eosinophils Relative: 2.7 % (ref 0.0–5.0)
HCT: 44 % (ref 36.0–46.0)
Hemoglobin: 14.6 g/dL (ref 12.0–15.0)
Lymphocytes Relative: 32.8 % (ref 12.0–46.0)
Lymphs Abs: 1.3 10*3/uL (ref 0.7–4.0)
MCHC: 33.1 g/dL (ref 30.0–36.0)
MCV: 89.3 fl (ref 78.0–100.0)
Monocytes Absolute: 0.3 10*3/uL (ref 0.1–1.0)
Monocytes Relative: 7.5 % (ref 3.0–12.0)
Neutro Abs: 2.3 10*3/uL (ref 1.4–7.7)
Neutrophils Relative %: 56.4 % (ref 43.0–77.0)
Platelets: 211 10*3/uL (ref 150.0–400.0)
RBC: 4.93 Mil/uL (ref 3.87–5.11)
RDW: 12.8 % (ref 11.5–15.5)
WBC: 4.1 10*3/uL (ref 4.0–10.5)

## 2023-08-05 LAB — IBC + FERRITIN
Ferritin: 45.1 ng/mL (ref 10.0–291.0)
Iron: 60 ug/dL (ref 42–145)
Saturation Ratios: 20.9 % (ref 20.0–50.0)
TIBC: 287 ug/dL (ref 250.0–450.0)
Transferrin: 205 mg/dL — ABNORMAL LOW (ref 212.0–360.0)

## 2023-09-16 DIAGNOSIS — Z1231 Encounter for screening mammogram for malignant neoplasm of breast: Secondary | ICD-10-CM | POA: Diagnosis not present

## 2023-09-16 LAB — HM MAMMOGRAPHY

## 2023-09-25 ENCOUNTER — Ambulatory Visit: Payer: Medicare Other

## 2023-09-25 VITALS — Ht 70.0 in | Wt 189.0 lb

## 2023-09-25 DIAGNOSIS — Z0000 Encounter for general adult medical examination without abnormal findings: Secondary | ICD-10-CM

## 2023-09-25 NOTE — Progress Notes (Signed)
 Subjective:   Lorraine Woods is a 78 y.o. female who presents for Medicare Annual (Subsequent) preventive examination.  Visit Complete: Virtual I connected with  Lorraine Woods on 09/25/23 by a audio enabled telemedicine application and verified that I am speaking with the correct person using two identifiers.  Patient Location: Home  Provider Location: Office/Clinic  I discussed the limitations of evaluation and management by telemedicine. The patient expressed understanding and agreed to proceed.  Vital Signs: Because this visit was a virtual/telehealth visit, some criteria may be missing or patient reported. Any vitals not documented were not able to be obtained and vitals that have been documented are patient reported.  Patient Medicare AWV questionnaire was completed by the patient on 09/20/2023; I have confirmed that all information answered by patient is correct and no changes since this date.  Cardiac Risk Factors include: advanced age (>80men, >37 women);dyslipidemia;family history of premature cardiovascular disease     Objective:    Today's Vitals   09/25/23 1007 09/25/23 1008  Weight: 189 lb (85.7 kg)   Height: 5\' 10"  (1.778 m)   PainSc:  2    Body mass index is 27.12 kg/m.     09/25/2023   10:17 AM 09/24/2022   10:02 AM 10/24/2021   12:00 AM 10/12/2021   10:37 AM 09/18/2021   10:09 AM 08/03/2020   10:51 AM 06/15/2020    9:21 AM  Advanced Directives  Does Patient Have a Medical Advance Directive? Yes Yes Yes Yes Yes Yes Yes  Type of Estate agent of [place];Living will Living will Healthcare Power of [place];Living will Healthcare Power of [place];Living will Living will;Healthcare Power of [employer] Power of [place];Living will Living will  Does patient want to make changes to medical advance directive?  No - Patient declined No - Patient declined  No - Patient declined No - Patient declined No - Patient declined  Copy of Healthcare  Power of Attorney in Chart? No - copy requested  No - copy requested  Yes - validated most recent copy scanned in chart (See row information) No - copy requested     Current Medications (verified) Outpatient Encounter Medications as of 09/25/2023  Medication Sig   Calcium  Carb-Cholecalciferol (CALTRATE 600+D3 PO) Take 1 tablet by mouth daily.   cholecalciferol (VITAMIN D3) 25 MCG (1000 UNIT) tablet Take 1,000 Units by mouth daily.   ipratropium (ATROVENT ) 0.03 % nasal spray Place 2 sprays into both nostrils 2 (two) times daily. SPRAY 2 SPRAYS INTO BOTH NOSTRILS EVERY 12 HOURS   methylcellulose (CITRUCEL) oral powder Take 1 packet by mouth daily.   minoxidil (ROGAINE) 2 % external solution Apply 1 application. topically 3 (three) times a week.   Multiple Vitamin (MULTIVITAMIN WITH MINERALS) TABS tablet Take 1 tablet by mouth every morning.   omeprazole  (PRILOSEC) 20 MG capsule TAKE 1 CAPSULE BY MOUTH EVERY DAY   rosuvastatin  (CRESTOR ) 10 MG tablet Take 1 tablet (10 mg total) by mouth at bedtime.   sertraline  (ZOLOFT ) 100 MG tablet TAKE 1 TABLET BY MOUTH EVERY DAY IN THE MORNING   SUMAtriptan  (IMITREX ) 100 MG tablet TAKE 1 TABLET BY MOUTH EVERY 2HRS AS NEEDED FOR MIGRAINE. MAY REPEAT IN 2HRS IF HEADACHE PERSISTS   No facility-administered encounter medications on file as of 09/25/2023.    Allergies (verified) Excedrin migraine [aspirin -acetaminophen -caffeine]   History: Past Medical History:  Diagnosis Date   Allergy    Anemia    labs ok now   Arthritis  Basal cell carcinoma    BASAL CELL ON NOSE   Depression    GAD (generalized anxiety disorder)    GERD (gastroesophageal reflux disease)    History of basal cell carcinoma (BCC) excision    nose  x2  last one 2019   Hyperlipidemia    Hyperplastic colon polyp    IDA (iron deficiency anemia)    Knee pain, chronic    Migraine    Osteopenia    PMB (postmenopausal bleeding)    Seasonal allergies    Thickened endometrium    Wears  glasses    Past Surgical History:  Procedure Laterality Date   APPENDECTOMY  57   age 89   BREAST LUMPECTOMY Right 2002   on HRT, fibromadenoma   CHOLECYSTECTOMY N/A 09/17/2013   Procedure: LAPAROSCOPIC CHOLECYSTECTOMY;  Surgeon: Lockie Rima, MD;  Location: [facility];  Service: General;  Laterality: N/A;   COLONOSCOPY WITH PROPOFOL   last one 06-28-2017  dr General Kenner   DILATATION & CURETTAGE/HYSTEROSCOPY WITH MYOSURE N/A 06/15/2020   Procedure: DILATATION & CURETTAGE/HYSTEROSCOPY WITH MYOSURE;  Surgeon: Olin Bertin, DO;  Location: [facility];  Service: Gynecology;  Laterality: N/A;   FRACTURE SURGERY     lt. ankle, rt. wrist   JOINT REPLACEMENT  10/2021   rt. knee   LAPAROSCOPIC LIVER CYST UNROOFING N/A 09/17/2013   Procedure: LAPAROSCOPIC LIVER CYST UNROOFING;  Surgeon: Lockie Rima, MD;  Location: [facility];  Service: General;  Laterality: N/A;   MOHS SURGERY  x2  last one approx. 2019   bcc of nose   ORIF ANKLE FRACTURE Left 2000   ORIF WRIST FRACTURE Right 10-30-2016  @[facility]    sclerotherapy on left leg  2013   TOTAL KNEE ARTHROPLASTY Right 10/23/2021   Procedure: TOTAL KNEE ARTHROPLASTY;  Surgeon: Liliane Rei, MD;  Location: [facility];  Service: Orthopedics;  Laterality: Right;   TUBAL LIGATION  1982   Family History  Problem Relation Age of Onset   Brain cancer Mother 8   Cancer Mother    Diabetes Father    Hypertension Father    CAD Father    Heart disease Father    Colon polyps Son        Puetz-jehgers syndrome   Hypertension Son    Colon cancer Neg Hx    Esophageal cancer Neg Hx    Rectal cancer Neg Hx    Stomach cancer Neg Hx    Social History   Socioeconomic History   Marital status: Married    Spouse name: John    Number of children: 2   Years of education: 16   Highest education level: Tax adviser degree: occupational, Scientist, product/process development, or vocational program  Occupational History   Occupation: [employer] Retail buyer: [employer]    Comment: retired  Tobacco Use   Smoking status: Never   Smokeless tobacco: Never  Vaping Use   Vaping status: Never Used  Substance and Sexual Activity   Alcohol use: Yes    Comment: on special occasions only   Drug use: Never   Sexual activity: Not Currently    Partners: Male    Birth control/protection: Post-menopausal  Other Topics Concern   Not on file  Social History Narrative   Lives with her husband. Enjoys going to the gym.   Social Drivers of Health   Financial Resource Strain: Low Risk  (09/25/2023)   Overall Financial Resource Strain (CARDIA)    Difficulty of Paying Living Expenses: Not hard  at all  Food Insecurity: No Food Insecurity (09/25/2023)   Hunger Vital Sign    Worried About Running Out of Food in the Last Year: Never true    Ran Out of Food in the Last Year: Never true  Transportation Needs: No Transportation Needs (09/25/2023)   PRAPARE - Administrator, Civil Service (Medical): No    Lack of Transportation (Non-Medical): No  Physical Activity: Insufficiently Active (09/25/2023)   Exercise Vital Sign    Days of Exercise per Week: 1 day    Minutes of Exercise per Session: 20 min  Stress: No Stress Concern Present (09/25/2023)   [employer] of Occupational Health - Occupational Stress Questionnaire    Feeling of Stress : Only a little  Social Connections: Socially Integrated (09/25/2023)   Social Connection and Isolation Panel [NHANES]    Frequency of Communication with Friends and Family: Twice a week    Frequency of Social Gatherings with Friends and Family: Once a week    Attends Religious Services: More than 4 times per year    Active Member of [employer] or Organizations: Yes    Attends Engineer, structural: More than 4 times per year    Marital Status: Married    Tobacco Counseling Counseling given: Not Answered   Clinical Intake:  Pre-visit preparation completed: Yes  Pain : 0-10 Pain Score: 2  Pain Type:  Acute pain Pain Location: Neck Pain Descriptors / Indicators: Aching Pain Onset: 1 to 4 weeks ago Pain Frequency: Intermittent Pain Relieving Factors: tylenol   Pain Relieving Factors: tylenol   BMI - recorded: 27.12 Nutritional Status: BMI 25 -29 Overweight Nutritional Risks: None Diabetes: No  How often do you need to have someone help you when you read instructions, pamphlets, or other written materials from your doctor or pharmacy?: 1 - Never What is the last grade level you completed in school?: 14  Interpreter Needed?: No      Activities of Daily Living    09/25/2023   10:10 AM 09/18/2023   11:47 AM  In your present state of health, do you have any difficulty performing the following activities:  Hearing? 0 0  Vision? 0 0  Difficulty concentrating or making decisions? 1 1  Walking or climbing stairs? 1 1  Dressing or bathing? 0 0  Doing errands, shopping? 0 0  Preparing Food and eating ? N   Using the Toilet? N N  In the past six months, have you accidently leaked urine? Y Y  Do you have problems with loss of bowel control? N N  Managing your Medications? N N  Managing your Finances? N N  Housekeeping or managing your Housekeeping? N N    Patient Care Team: Cydney Draft, MD as PCP - General (Family Medicine) Armbruster, Lendon Queen, MD as Consulting Physician (Gastroenterology) Olin Bertin, DO as Consulting Physician (Obstetrics and Gynecology)  Indicate any recent Medical Services you may have received from other than [facility] providers in the past year (date may be approximate).     Assessment:   This is a routine wellness examination for Lorraine Woods.  Hearing/Vision screen No results found.   Goals Addressed             This Visit's Progress    Patient Stated       Patient stated she would like to be more active.       Depression Screen    09/25/2023   10:16 AM 05/20/2023   10:50 AM 12/03/2022  10:13 AM 09/24/2022   10:03 AM 06/01/2022     1:55 PM 09/18/2021   10:10 AM 11/02/2020   11:15 AM  PHQ 2/9 Scores  PHQ - 2 Score 0 2 0 0 0 1 1  PHQ- 9 Score   1  4 1 5     Fall Risk    09/25/2023   10:17 AM 09/18/2023   11:47 AM 05/20/2023    9:07 AM 12/03/2022   10:13 AM 09/24/2022   10:03 AM  Fall Risk   Falls in the past year? 1 1 1 1 1   Number falls in past yr: 1 1 1 1 1   Injury with Fall? 0 0 0 1 1  Risk for fall due to : History of fall(s);Impaired mobility  History of fall(s) History of fall(s) History of fall(s)  Follow up Falls evaluation completed  Falls evaluation completed Falls evaluation completed Falls evaluation completed;Education provided;Falls prevention discussed    MEDICARE RISK AT HOME: Medicare Risk at Home Any stairs in or around the home?: No Home free of loose throw rugs in walkways, pet beds, electrical cords, etc?: Yes Adequate lighting in your home to reduce risk of falls?: Yes Life alert?: No Use of a cane, walker or w/c?: No Grab bars in the bathroom?: No Shower chair or bench in shower?: No Elevated toilet seat or a handicapped toilet?: No  TIMED UP AND GO:  Was the test performed?  No    Cognitive Function:        09/25/2023   10:18 AM 09/24/2022   10:10 AM 09/18/2021   10:21 AM 08/03/2020   11:07 AM 05/11/2019    9:35 AM  6CIT Screen  What Year? 0 points 0 points 0 points 0 points 0 points  What month? 0 points 0 points 0 points 0 points 0 points  What time? 0 points 0 points 0 points 0 points 0 points  Count back from 20 0 points 0 points 0 points 0 points 0 points  Months in reverse 0 points 0 points 0 points 0 points 0 points  Repeat phrase 0 points 0 points 2 points 2 points 0 points  Total Score 0 points 0 points 2 points 2 points 0 points    Immunizations Immunization History  Administered Date(s) Administered   Fluad Quad(high Dose 65+) 02/11/2019, 03/14/2021, 04/30/2022   Influenza, High Dose Seasonal PF 03/12/2016, 03/07/2017, 03/06/2020, 03/11/2023   Influenza, Seasonal,  Injecte, Preservative Fre 03/05/2014   Influenza,inj,Quad PF,6+ Mos 03/10/2018   Influenza-Unspecified 03/04/2013, 03/05/2015, 03/14/2016   PFIZER Comirnaty(Gray Top)Covid-19 Tri-Sucrose Vaccine 10/11/2020   PFIZER(Purple Top)SARS-COV-2 Vaccination 07/06/2019, 07/27/2019, 04/06/2020   Pfizer Covid-19 Vaccine Bivalent Booster 15yrs & up 03/31/2021   Pfizer(Comirnaty)Fall Seasonal Vaccine 12 years and older 04/06/2022, 03/11/2023   Pneumococcal Conjugate-13 12/29/2013   Pneumococcal Polysaccharide-23 06/04/2005, 10/20/2015   Tdap 03/15/2009, 04/02/2020   Zoster, Live 01/03/2012, 01/30/2012    TDAP status: Up to date  Flu Vaccine status: Up to date  Pneumococcal vaccine status: Up to date  Covid-19 vaccine status: Information provided on how to obtain vaccines.   Qualifies for Shingles Vaccine? Yes   Zostavax completed No   Shingrix Completed?: No.    Education has been provided regarding the importance of this vaccine. Patient has been advised to call insurance company to determine out of pocket expense if they have not yet received this vaccine. Advised may also receive vaccine at local pharmacy or Health Dept. Verbalized acceptance and understanding.  Screening Tests Health Maintenance  Topic Date Due   Zoster Vaccines- Shingrix (1 of 2) 08/09/1964   COVID-19 Vaccine (8 - Pfizer risk 2024-25 season) 09/09/2023   INFLUENZA VACCINE  01/03/2024   MAMMOGRAM  09/15/2024   Medicare Annual Wellness (AWV)  09/24/2024   Colonoscopy  10/01/2027   DTaP/Tdap/Td (3 - Td or Tdap) 04/02/2030   Pneumonia Vaccine 47+ Years old  Completed   DEXA SCAN  Completed   Hepatitis C Screening  Completed   HPV VACCINES  Aged Out   Meningococcal B Vaccine  Aged Out    Health Maintenance  Health Maintenance Due  Topic Date Due   Zoster Vaccines- Shingrix (1 of 2) 08/09/1964   COVID-19 Vaccine (8 - Pfizer risk 2024-25 season) 09/09/2023    Colorectal cancer screening: Type of screening:  Colonoscopy. Completed 10/01/2022. Repeat every 5 years  Mammogram status: Completed 09/16/2023. Repeat every year  Bone Density status: Completed 03/30/2021. Results reflect: Bone density results: NORMAL. Repeat every   years.  Lung Cancer Screening: (Low Dose CT Chest recommended if Age 36-80 years, 20 pack-year currently smoking OR have quit w/in 15years.) does not qualify.   Lung Cancer Screening Referral: n/a  Additional Screening:  Hepatitis C Screening: does not qualify; Completed 04/19/2015  Vision Screening: Recommended annual ophthalmology exams for early detection of glaucoma and other disorders of the eye. Is the patient up to date with their annual eye exam?  Yes  Who is the provider or what is the name of the office in which the patient attends annual eye exams? Dr Raquel Cables - [facility] If pt is not established with a provider, would they like to be referred to a provider to establish care?  N/a .   Dental Screening: Recommended annual dental exams for proper oral hygiene    Community Resource Referral / Chronic Care Management: CRR required this visit?  No   CCM required this visit?  No     Plan:     I have personally reviewed and noted the following in the patient's chart:   Medical and social history Use of alcohol, tobacco or illicit drugs  Current medications and supplements including opioid prescriptions. Patient is not currently taking opioid prescriptions. Functional ability and status Nutritional status Physical activity Advanced directives List of other physicians Hospitalizations, surgeries, and ER visits in previous 12 months. None Vitals Screenings to include cognitive, depression, and falls Referrals and appointments  In addition, I have reviewed and discussed with patient certain preventive protocols, quality metrics, and best practice recommendations. A written personalized care plan for preventive services as well as general preventive health  recommendations were provided to patient.     Aubrey Leaf, [place]   09/25/2023   After Visit Summary: (MyChart) Due to this being a telephonic visit, the after visit summary with patients personalized plan was offered to patient via MyChart   Nurse Notes:    Lorraine Woods is a 78 y.o. female patient of Metheney, Corita Diego, MD who had a Medicare Annual Wellness Visit today via telephone. Lorraine Woods is Retired and lives with their spouse. She has 2 children. She reports that she is socially active and does interact with friends/family regularly. She is minimally physically active.

## 2023-09-25 NOTE — Patient Instructions (Signed)
  Lorraine Woods , Thank you for taking time to come for your Medicare Wellness Visit. I appreciate your ongoing commitment to your health goals. Please review the following plan we discussed and let me know if I can assist you in the future.   These are the goals we discussed:  Goals       Exercise 3x per week (30 min per time)      Exercise at [employer] several times a week aas able      Medication Management      Patient Goals/Self-Care Activities Over the next 365 days, patient will:  take medications as prescribed   Follow Up Plan: Telephone follow up appointment with care management team member scheduled for:  1 year      Patient Stated      Would like to go back to gym and [employer] program when ever they open due to COVID      Patient Stated (pt-stated)      08/03/2020 AWV Goal: Fall Prevention  Over the next year, patient will decrease their risk for falls by: Using assistive devices, such as a cane or walker, as needed Identifying fall risks within their home and correcting them by: Removing throw rugs Adding handrails to stairs or ramps Removing clutter and keeping a clear pathway throughout the home Increasing light, especially at night Adding shower handles/bars Raising toilet seat Identifying potential personal risk factors for falls: Medication side effects Incontinence/urgency Vestibular dysfunction Hearing loss Musculoskeletal disorders Neurological disorders Orthostatic hypotension        Patient Stated (pt-stated)      Would like to be able to go back to silver sneakers after her knee replacement.      Patient Stated (pt-stated)      Patient stated that she would like to work on being more physically active.      Patient Stated      Patient stated she would like to be more active.         This is a list of the screening recommended for you and due dates:  Health Maintenance  Topic Date Due   Zoster (Shingles) Vaccine (1 of 2) 08/09/1964    COVID-19 Vaccine (8 - Pfizer risk 2024-25 season) 09/09/2023   Flu Shot  01/03/2024   Mammogram  09/15/2024   Medicare Annual Wellness Visit  09/24/2024   Colon Cancer Screening  10/01/2027   DTaP/Tdap/Td vaccine (3 - Td or Tdap) 04/02/2030   Pneumonia Vaccine  Completed   DEXA scan (bone density measurement)  Completed   Hepatitis C Screening  Completed   HPV Vaccine  Aged Out   Meningitis B Vaccine  Aged Out

## 2023-11-18 ENCOUNTER — Encounter: Payer: Self-pay | Admitting: Family Medicine

## 2023-11-18 ENCOUNTER — Ambulatory Visit (INDEPENDENT_AMBULATORY_CARE_PROVIDER_SITE_OTHER): Payer: Medicare Other | Admitting: Family Medicine

## 2023-11-18 VITALS — BP 115/63 | HR 67 | Ht 70.0 in | Wt 187.2 lb

## 2023-11-18 DIAGNOSIS — M542 Cervicalgia: Secondary | ICD-10-CM | POA: Insufficient documentation

## 2023-11-18 DIAGNOSIS — N760 Acute vaginitis: Secondary | ICD-10-CM

## 2023-11-18 DIAGNOSIS — E785 Hyperlipidemia, unspecified: Secondary | ICD-10-CM

## 2023-11-18 DIAGNOSIS — F411 Generalized anxiety disorder: Secondary | ICD-10-CM | POA: Diagnosis not present

## 2023-11-18 NOTE — Assessment & Plan Note (Signed)
 Significant limitation in range of motion which is fairly symmetric and pretty consistent with osteoarthritis she says she knows she has arthritis in her neck she has a strain down the trapezius muscle on the left side as well.  Given handout with stretches to do on her own at home.  If not improving over the next couple of weeks then please let me know can refer for formal PT.  Discussed just also looking to make sure that there is nothing she is doing like working on her computer etc. where she is in neck flexion for extended periods of time that may be causing some of the symptoms.

## 2023-11-18 NOTE — Progress Notes (Signed)
 Established Patient Office Visit  Subjective  Patient ID: Lorraine Woods, female    DOB: 26-Sep-1945  Age: 78 y.o. MRN: [id number]  Chief Complaint  Patient presents with   general anxiety disorder    Mood follow up visit     HPI  F/U GAD - 6  months for GAD.  She is doing okay overall she is happy with her current regimen.  PHQ-9 score of 2 and GAD-7 score of 3.  Rates her symptoms as not difficult.  Follow-up hyperlipidemia-when she first started the statin she was waking up with muscle aches and pain she spoke with her clinical pharmacist who recommended taking it every other she said that did much better and then she gradually work backed up to daily now she is tolerating it well and she is taking it daily.  Due to recheck labs.  Been having some left-sided neck pain and stiffness.  Has been going on for a few weeks at this point no specific injuries or trauma she says she knows she has a little bit of arthritis in her neck.  He also notices that her neck will pop sometimes when she is just turning it.  Especially if she is sitting in a high-back chair.  She is also had some vaginal irritation.  She did use some over-the-counter badges sill and got some temporary relief but it just has not gone completely away.  She says she has a bump there but no rash etc.  She says the whole area feels irritated though.    ROS    Objective:     BP 115/63   Pulse 67   Ht 5' 10 (1.778 m)   Wt 187 lb 4 oz (84.9 kg)   SpO2 98%   BMI 26.87 kg/m    Physical Exam Vitals and nursing note reviewed.  Constitutional:      Appearance: Normal appearance.  HENT:     Head: Normocephalic and atraumatic.   Eyes:     Conjunctiva/sclera: Conjunctivae normal.    Cardiovascular:     Rate and Rhythm: Normal rate and regular rhythm.  Pulmonary:     Effort: Pulmonary effort is normal.     Breath sounds: Normal breath sounds.   Musculoskeletal:     Comments: With normal flexion, decreased  extension and decreased rotation right and left.  A little bit of tenderness over the left trapezius.   Skin:    General: Skin is warm and dry.   Neurological:     Mental Status: She is alert.   Psychiatric:        Mood and Affect: Mood normal.      No results found for any visits on 11/18/23.    The 10-year ASCVD risk score (Arnett DK, et al., 2019) is: 16.4%    Assessment & Plan:   Problem List Items Addressed This Visit       Other   Hyperlipidemia   Happy to hear that she has been tolerating her statin she just had to gradually work up to daily dosing.  Due to recheck lipids.      Relevant Orders   CMP14+EGFR   Lipid panel   Generalized anxiety disorder - Primary   She is actually doing really well on her current regimen of sertraline .  No changes made today if continuing to do well then plan to follow-up in 6 months.     09/25/2023   10:16 AM 05/20/2023   10:50 AM 12/03/2022  10:13 AM 09/24/2022   10:03 AM 06/01/2022    1:55 PM  Depression screen PHQ 2/9  Decreased Interest 0 1 0 0 0  Down, Depressed, Hopeless 0 1 0 0 0  PHQ - 2 Score 0 2 0 0 0  Altered sleeping   0  2  Tired, decreased energy  1 1  1   Change in appetite  0 0  0  Feeling bad or failure about yourself   0 0  0  Trouble concentrating  0 0  0  Moving slowly or fidgety/restless  0 0  1  Suicidal thoughts  0 0  0  PHQ-9 Score   1  4  Difficult doing work/chores  Not difficult at all Not difficult at all  Not difficult at all      05/20/2023   10:51 AM 12/03/2022   10:13 AM 06/01/2022    1:56 PM 11/02/2020   11:14 AM  GAD 7 : Generalized Anxiety Score  Nervous, Anxious, on Edge 1 2 1 1   Control/stop worrying 2 0 0 1  Worry too much - different things 1 1 1  0  Trouble relaxing 0 0 0 0  Restless 0 0 0 0  Easily annoyed or irritable 0 1 1 1   Afraid - awful might happen 1 0 0 0  Total GAD 7 Score 5 4 3 3   Anxiety Difficulty Not difficult at all Not difficult at all Not difficult at all  Somewhat difficult          Cervical pain   Significant limitation in range of motion which is fairly symmetric and pretty consistent with osteoarthritis she says she knows she has arthritis in her neck she has a strain down the trapezius muscle on the left side as well.  Given handout with stretches to do on her own at home.  If not improving over the next couple of weeks then please let me know can refer for formal PT.  Discussed just also looking to make sure that there is nothing she is doing like working on her computer etc. where she is in neck flexion for extended periods of time that may be causing some of the symptoms.      Other Visit Diagnoses       Acute vaginitis       Relevant Orders   WET PREP FOR TRICH, YEAST, CLUE      Acute vaginitis-self wet prep performed will call with results once available.  I spent 40 minutes on the day of the encounter to include pre-visit record review, face-to-face time with the patient and post visit ordering of test.   Return in about 6 months (around 05/19/2024) for Anxiety and labs .    Lorraine German, MD

## 2023-11-18 NOTE — Assessment & Plan Note (Signed)
 She is actually doing really well on her current regimen of sertraline .  No changes made today if continuing to do well then plan to follow-up in 6 months.     09/25/2023   10:16 AM 05/20/2023   10:50 AM 12/03/2022   10:13 AM 09/24/2022   10:03 AM 06/01/2022    1:55 PM  Depression screen PHQ 2/9  Decreased Interest 0 1 0 0 0  Down, Depressed, Hopeless 0 1 0 0 0  PHQ - 2 Score 0 2 0 0 0  Altered sleeping   0  2  Tired, decreased energy  1 1  1   Change in appetite  0 0  0  Feeling bad or failure about yourself   0 0  0  Trouble concentrating  0 0  0  Moving slowly or fidgety/restless  0 0  1  Suicidal thoughts  0 0  0  PHQ-9 Score   1  4  Difficult doing work/chores  Not difficult at all Not difficult at all  Not difficult at all      05/20/2023   10:51 AM 12/03/2022   10:13 AM 06/01/2022    1:56 PM 11/02/2020   11:14 AM  GAD 7 : Generalized Anxiety Score  Nervous, Anxious, on Edge 1 2 1 1   Control/stop worrying 2 0 0 1  Worry too much - different things 1 1 1  0  Trouble relaxing 0 0 0 0  Restless 0 0 0 0  Easily annoyed or irritable 0 1 1 1   Afraid - awful might happen 1 0 0 0  Total GAD 7 Score 5 4 3 3   Anxiety Difficulty Not difficult at all Not difficult at all Not difficult at all Somewhat difficult

## 2023-11-18 NOTE — Assessment & Plan Note (Signed)
 Happy to hear that she has been tolerating her statin she just had to gradually work up to daily dosing.  Due to recheck lipids.

## 2023-11-19 ENCOUNTER — Ambulatory Visit: Payer: Self-pay | Admitting: Family Medicine

## 2023-11-19 LAB — CMP14+EGFR
ALT: 13 IU/L (ref 0–32)
AST: 20 IU/L (ref 0–40)
Albumin: 4.4 g/dL (ref 3.8–4.8)
Alkaline Phosphatase: 56 IU/L (ref 44–121)
BUN/Creatinine Ratio: 21 (ref 12–28)
BUN: 16 mg/dL (ref 8–27)
Bilirubin Total: 0.6 mg/dL (ref 0.0–1.2)
CO2: 22 mmol/L (ref 20–29)
Calcium: 9.1 mg/dL (ref 8.7–10.3)
Chloride: 105 mmol/L (ref 96–106)
Creatinine, Ser: 0.76 mg/dL (ref 0.57–1.00)
Globulin, Total: 2.4 g/dL (ref 1.5–4.5)
Glucose: 94 mg/dL (ref 70–99)
Potassium: 4.2 mmol/L (ref 3.5–5.2)
Sodium: 143 mmol/L (ref 134–144)
Total Protein: 6.8 g/dL (ref 6.0–8.5)
eGFR: 81 mL/min/{1.73_m2} (ref >59)

## 2023-11-19 LAB — WET PREP FOR TRICH, YEAST, CLUE
Clue Cell Exam: NEGATIVE
Trichomonas Exam: NEGATIVE
Yeast Exam: POSITIVE — AB

## 2023-11-19 LAB — LIPID PANEL
Chol/HDL Ratio: 2.3 ratio (ref 0.0–4.4)
Cholesterol, Total: 127 mg/dL (ref 100–199)
HDL: 55 mg/dL (ref >39)
LDL Chol Calc (NIH): 54 mg/dL (ref 0–99)
Triglycerides: 96 mg/dL (ref 0–149)
VLDL Cholesterol Cal: 18 mg/dL (ref 5–40)

## 2023-11-19 NOTE — Progress Notes (Signed)
 Your lab work is within acceptable range and there are no concerning findings.   ?

## 2023-11-20 ENCOUNTER — Telehealth: Payer: Self-pay

## 2023-11-20 ENCOUNTER — Encounter: Payer: Self-pay | Admitting: Family Medicine

## 2023-11-20 MED ORDER — FLUCONAZOLE 150 MG PO TABS
150.0000 mg | ORAL_TABLET | Freq: Once | ORAL | 1 refills | Status: AC
Start: 2023-11-20 — End: 2023-11-20

## 2023-11-20 NOTE — Telephone Encounter (Signed)
 Copied from CRM [id number]. Topic: Clinical - Prescription Issue >> Nov 20, 2023 12:32 PM Ary Bitter R wrote: Reason for CRM: Pt is currently at her [facility] and they stated that they did not receive the rx for fluconazole  (DIFLUCAN ) 150 MG tablet. Asking if the rx can be sent again.

## 2023-11-20 NOTE — Progress Notes (Signed)
 Hi Lorraine Woods, swab came back positive for yeast infection.  I will send over Diflucan  to your pharmacy to get this cleared up.

## 2023-11-20 NOTE — Telephone Encounter (Signed)
 Sent Mychart message to patient to see if the pharmacy where diflucan  was sent was correct pharmacy. Awaiting response form patient.

## 2023-12-13 ENCOUNTER — Ambulatory Visit: Payer: Self-pay

## 2023-12-13 NOTE — Telephone Encounter (Signed)
 FYI Only or Action Required?: FYI only for provider.  Patient was last seen in primary care on 11/18/2023 by Alvan Dorothyann BIRCH, MD.  Called Nurse Triage reporting Abdominal Pain.  Symptoms began several weeks ago.  Interventions attempted: Rest, hydration, or home remedies.  Symptoms are: gradually worsening.  Triage Disposition: See Physician Within 24 Hours  Patient/caregiver understands and will follow disposition?: Yes     Symptoms have been present for 3 weeks. First available appointment made for 7/16      Copied from CRM [id number]. Topic: Clinical - Red Word Triage >> Dec 13, 2023  9:34 AM Zane F wrote: [facility] that prompted transfer to Nurse Triage:   Pain in upper right abdomen  (Started 3 weeks ago; thought it was a pulled muscle but hasn't seen relief)  Pain Level- 5/10 but occasionally gets intense and when that occurs it is a strong 8/10    Reason for Disposition  [1] MODERATE pain (e.g., interferes with normal activities) AND [2] comes and goes (cramps) AND [3] present > 24 hours  (Exception: Pain with Vomiting or Diarrhea - see that Guideline.)    Symptoms present for 3 weeks, first available appointment made on 7/16  Answer Assessment - Initial Assessment Questions 1. LOCATION: Where does it hurt?      Right upper quadrant  2. RADIATION: Does the pain shoot anywhere else? (e.g., chest, back)     No radiation  3. ONSET: When did the pain begin? (e.g., minutes, hours or days ago)      3 weeks ago 4. SUDDEN: Gradual or sudden onset?     Sudden 5. PATTERN Does the pain come and go, or is it constant?     Intermittent  6. SEVERITY: How bad is the pain?  (e.g., Scale 1-10; mild, moderate, or severe)     5/10-8/10 7. RECURRENT SYMPTOM: Have you ever had this type of stomach pain before? If Yes, ask: When was the last time? and What happened that time?      Yes 8. AGGRAVATING FACTORS: Does anything seem to cause this pain?  (e.g., foods, stress, alcohol)     No 9. CARDIAC SYMPTOMS: Do you have any of the following symptoms: chest pain, difficulty breathing, sweating, nausea?     No 10. OTHER SYMPTOMS: Do you have any other symptoms? (e.g., back pain, diarrhea, fever, urination pain, vomiting)       No  Protocols used: Abdominal Pain - Upper-A-AH

## 2023-12-18 ENCOUNTER — Encounter: Payer: Self-pay | Admitting: Physician Assistant

## 2023-12-18 ENCOUNTER — Ambulatory Visit (INDEPENDENT_AMBULATORY_CARE_PROVIDER_SITE_OTHER): Admitting: Physician Assistant

## 2023-12-18 VITALS — BP 138/65 | HR 65 | Ht 70.0 in | Wt 187.0 lb

## 2023-12-18 DIAGNOSIS — K7689 Other specified diseases of liver: Secondary | ICD-10-CM | POA: Diagnosis not present

## 2023-12-18 DIAGNOSIS — H524 Presbyopia: Secondary | ICD-10-CM | POA: Diagnosis not present

## 2023-12-18 DIAGNOSIS — R1011 Right upper quadrant pain: Secondary | ICD-10-CM | POA: Insufficient documentation

## 2023-12-18 DIAGNOSIS — R198 Other specified symptoms and signs involving the digestive system and abdomen: Secondary | ICD-10-CM | POA: Diagnosis not present

## 2023-12-18 DIAGNOSIS — H2513 Age-related nuclear cataract, bilateral: Secondary | ICD-10-CM | POA: Diagnosis not present

## 2023-12-18 DIAGNOSIS — H35363 Drusen (degenerative) of macula, bilateral: Secondary | ICD-10-CM | POA: Diagnosis not present

## 2023-12-18 DIAGNOSIS — H43813 Vitreous degeneration, bilateral: Secondary | ICD-10-CM | POA: Diagnosis not present

## 2023-12-18 NOTE — Progress Notes (Unsigned)
 Acute Office Visit  Subjective:     Patient ID: Lorraine Woods, female    DOB: 1946/01/17, 78 y.o.   MRN: [id number]  Chief Complaint  Patient presents with   Abdominal Pain    Intermitting abdominal pain onset 3 week, hx of liver cyst    HPI Patient is in today for intermittent right upper quadrant pain with a feeling of fullness and pressure for the last 3 weeks. Pt has hx of benign liver cyst that she had to have drained years ago. She has ongoing GERD. She is taking her omeprazole  daily and is not having any signs or symptoms or acid reflux. She denies and nausea, vomiting, diarrhea or constipation. Her bowel movements are well formed. Denies any melena or hematochezia. Food or drink does not change the pain. She has had no trauma to the area. The day the symptoms started she had been pulling weeds and wondered if it was a muscle she pulled. Tylenol  and ibuprofen have helped minimally.   PMhx of cholecystectomy and appendectomy.   ROS See HPI.      Objective:    BP 138/65   Pulse 65   Ht 5' 10 (1.778 m)   Wt 187 lb (84.8 kg)   SpO2 99%   BMI 26.83 kg/m    Physical Exam Constitutional:      Appearance: She is well-developed.  HENT:     Head: Normocephalic.  Cardiovascular:     Rate and Rhythm: Normal rate and regular rhythm.  Pulmonary:     Effort: Pulmonary effort is normal.     Breath sounds: Normal breath sounds.     Comments: No pain to palpation over right lower ribs or evidence of any step off. No bruising/redness or swelling in the area.  Abdominal:     General: Bowel sounds are normal. There is no distension.     Palpations: Abdomen is soft.     Tenderness: There is abdominal tenderness in the right upper quadrant. There is guarding. There is no right CVA tenderness, left CVA tenderness or rebound. Negative signs include Murphy's sign, Rovsing's sign, McBurney's sign, psoas sign and obturator sign.     Hernia: No hernia is present.  Neurological:      Mental Status: She is alert and oriented to person, place, and time.  Psychiatric:        Mood and Affect: Mood normal.         Assessment & Plan:  Lorraine Woods was seen today for abdominal pain.  Diagnoses and all orders for this visit:  Right upper quadrant guarding -     CBC w/Diff/Platelet -     CMP14+EGFR -     Lipase -     CT ABDOMEN PELVIS W CONTRAST; Future  Right upper quadrant pain -     CBC w/Diff/Platelet -     CMP14+EGFR -     Lipase -     CT ABDOMEN PELVIS W CONTRAST; Future  Benign liver cyst -     CBC w/Diff/Platelet -     CMP14+EGFR -     Lipase -     CT ABDOMEN PELVIS W CONTRAST; Future   Unclear etiology of symptoms Concerned for return of liver cyst Symptoms not consistent with GERD/gastritis/pancreatitis since no exacerbation when eating/drinking or any nausea and vomiting She does have right upper quadrant guarding CT ordered today Cbc/cmp/lipase ordered to evaluate WBC for infection, HgB for any blood loss, lipase for pancreatitis, cmp for liver and  kidney function Follow up after CT  Vermell Bologna, PA-C

## 2023-12-18 NOTE — Patient Instructions (Addendum)
 Ordered labs today.  Ordered CT of abdomen today. Imaging will call you once approved.

## 2023-12-19 ENCOUNTER — Ambulatory Visit: Payer: Self-pay | Admitting: Physician Assistant

## 2023-12-19 DIAGNOSIS — R32 Unspecified urinary incontinence: Secondary | ICD-10-CM

## 2023-12-19 LAB — CBC WITH DIFFERENTIAL/PLATELET
Basophils Absolute: 0 x10E3/uL (ref 0.0–0.2)
Basos: 1 %
EOS (ABSOLUTE): 0.1 x10E3/uL (ref 0.0–0.4)
Eos: 2 %
Hematocrit: 42.8 % (ref 34.0–46.6)
Hemoglobin: 13.8 g/dL (ref 11.1–15.9)
Immature Grans (Abs): 0 x10E3/uL (ref 0.0–0.1)
Immature Granulocytes: 0 %
Lymphocytes Absolute: 1.6 x10E3/uL (ref 0.7–3.1)
Lymphs: 34 %
MCH: 29.1 pg (ref 26.6–33.0)
MCHC: 32.2 g/dL (ref 31.5–35.7)
MCV: 90 fL (ref 79–97)
Monocytes Absolute: 0.3 x10E3/uL (ref 0.1–0.9)
Monocytes: 7 %
Neutrophils Absolute: 2.6 x10E3/uL (ref 1.4–7.0)
Neutrophils: 56 %
Platelets: 223 x10E3/uL (ref 150–450)
RBC: 4.74 x10E6/uL (ref 3.77–5.28)
RDW: 12.2 % (ref 11.7–15.4)
WBC: 4.7 x10E3/uL (ref 3.4–10.8)

## 2023-12-19 LAB — CMP14+EGFR
ALT: 14 IU/L (ref 0–32)
AST: 21 IU/L (ref 0–40)
Albumin: 4.4 g/dL (ref 3.8–4.8)
Alkaline Phosphatase: 61 IU/L (ref 44–121)
BUN/Creatinine Ratio: 24 (ref 12–28)
BUN: 21 mg/dL (ref 8–27)
Bilirubin Total: 0.4 mg/dL (ref 0.0–1.2)
CO2: 19 mmol/L — ABNORMAL LOW (ref 20–29)
Calcium: 9.5 mg/dL (ref 8.7–10.3)
Chloride: 106 mmol/L (ref 96–106)
Creatinine, Ser: 0.87 mg/dL (ref 0.57–1.00)
Globulin, Total: 2.5 g/dL (ref 1.5–4.5)
Glucose: 132 mg/dL — ABNORMAL HIGH (ref 70–99)
Potassium: 4.4 mmol/L (ref 3.5–5.2)
Sodium: 146 mmol/L — ABNORMAL HIGH (ref 134–144)
Total Protein: 6.9 g/dL (ref 6.0–8.5)
eGFR: 69 mL/min/1.73 (ref >59)

## 2023-12-19 LAB — LIPASE: Lipase: 56 U/L (ref 14–85)

## 2023-12-19 NOTE — Progress Notes (Signed)
 Taneika,   Normal WBC and hemoglobin.  Normal lipase.  Kidney function down some.  Liver function looks great.   Will wait to see what CT shows.

## 2023-12-20 ENCOUNTER — Encounter: Payer: Self-pay | Admitting: Physician Assistant

## 2023-12-23 ENCOUNTER — Ambulatory Visit

## 2023-12-23 DIAGNOSIS — R198 Other specified symptoms and signs involving the digestive system and abdomen: Secondary | ICD-10-CM | POA: Diagnosis not present

## 2023-12-23 DIAGNOSIS — K5730 Diverticulosis of large intestine without perforation or abscess without bleeding: Secondary | ICD-10-CM | POA: Diagnosis not present

## 2023-12-23 DIAGNOSIS — K7689 Other specified diseases of liver: Secondary | ICD-10-CM | POA: Diagnosis not present

## 2023-12-23 DIAGNOSIS — R1011 Right upper quadrant pain: Secondary | ICD-10-CM

## 2023-12-23 MED ORDER — IOHEXOL 300 MG/ML  SOLN
100.0000 mL | Freq: Once | INTRAMUSCULAR | Status: AC | PRN
  Administered 2023-12-23: 100 mL via INTRAVENOUS

## 2023-12-25 NOTE — Telephone Encounter (Signed)
 We can see if she wasn't to come by to do a UA and culture with [facility] with the leaking.

## 2023-12-25 NOTE — Progress Notes (Signed)
 Charma,   Hepatic cysts continue to be seen. They do seem to be decreasing in size though. No suspicious masses.  You do have diverticulosis but no inflammation or infection. This is just a risk factor for diverticulitis but not the cause of any of your reported symptoms.   How are you feeling today?

## 2023-12-26 DIAGNOSIS — L821 Other seborrheic keratosis: Secondary | ICD-10-CM | POA: Diagnosis not present

## 2023-12-26 DIAGNOSIS — D225 Melanocytic nevi of trunk: Secondary | ICD-10-CM | POA: Diagnosis not present

## 2023-12-26 DIAGNOSIS — L814 Other melanin hyperpigmentation: Secondary | ICD-10-CM | POA: Diagnosis not present

## 2023-12-26 DIAGNOSIS — Z85828 Personal history of other malignant neoplasm of skin: Secondary | ICD-10-CM | POA: Diagnosis not present

## 2023-12-26 DIAGNOSIS — Z08 Encounter for follow-up examination after completed treatment for malignant neoplasm: Secondary | ICD-10-CM | POA: Diagnosis not present

## 2023-12-27 LAB — URINALYSIS, ROUTINE W REFLEX MICROSCOPIC
Bilirubin, UA: NEGATIVE
Glucose, UA: NEGATIVE
Ketones, UA: NEGATIVE
Nitrite, UA: NEGATIVE
Protein,UA: NEGATIVE
RBC, UA: NEGATIVE
Specific Gravity, UA: 1.017 (ref 1.005–1.030)
Urobilinogen, Ur: 0.2 mg/dL (ref 0.2–1.0)
pH, UA: 6 (ref 5.0–7.5)

## 2023-12-27 LAB — MICROSCOPIC EXAMINATION
Casts: NONE SEEN /LPF
Epithelial Cells (non renal): >10 /HPF — AB (ref 0–10)
RBC, Urine: NONE SEEN /HPF (ref 0–2)

## 2023-12-27 MED ORDER — NITROFURANTOIN MONOHYD MACRO 100 MG PO CAPS: 100.0000 mg | ORAL_CAPSULE | Freq: Two times a day (BID) | ORAL | 0 refills | Status: DC

## 2023-12-27 NOTE — Addendum Note (Signed)
 Addended by: Clemencia Helzer D on: 12/27/2023 08:05 AM   Modules accepted: Orders

## 2023-12-27 NOTE — Progress Notes (Signed)
 Hi Lorraine Woods, the urine sample that you dropped off does look like you probably have a urinary tract infection.  We did send it for culture but it will take 3 days for those results to come back.  Some good to go ahead and send in an antibiotic for you to start.

## 2023-12-28 LAB — URINE CULTURE

## 2023-12-30 NOTE — Telephone Encounter (Signed)
 Dr. Alvan, please see message sent by pt about recent results and advise.

## 2023-12-30 NOTE — Progress Notes (Signed)
 Hi Lorraine Woods, your final urine culture was negative no infection.  For your incontinence are you interested in seeing a specialist such as your gynecologist or urologist?

## 2024-02-13 DIAGNOSIS — Z23 Encounter for immunization: Secondary | ICD-10-CM | POA: Diagnosis not present

## 2024-02-22 DIAGNOSIS — Z23 Encounter for immunization: Secondary | ICD-10-CM | POA: Diagnosis not present

## 2024-03-05 ENCOUNTER — Other Ambulatory Visit: Payer: Self-pay | Admitting: Family Medicine

## 2024-03-05 DIAGNOSIS — F411 Generalized anxiety disorder: Secondary | ICD-10-CM

## 2024-05-18 ENCOUNTER — Encounter: Payer: Self-pay | Admitting: Family Medicine

## 2024-05-18 ENCOUNTER — Ambulatory Visit: Admitting: Family Medicine

## 2024-05-18 VITALS — BP 114/58 | HR 78 | Ht 70.0 in | Wt 185.5 lb

## 2024-05-18 DIAGNOSIS — M545 Low back pain, unspecified: Secondary | ICD-10-CM

## 2024-05-18 DIAGNOSIS — R4789 Other speech disturbances: Secondary | ICD-10-CM | POA: Insufficient documentation

## 2024-05-18 DIAGNOSIS — J3489 Other specified disorders of nose and nasal sinuses: Secondary | ICD-10-CM | POA: Diagnosis not present

## 2024-05-18 DIAGNOSIS — G8929 Other chronic pain: Secondary | ICD-10-CM | POA: Diagnosis not present

## 2024-05-18 DIAGNOSIS — N3946 Mixed incontinence: Secondary | ICD-10-CM | POA: Diagnosis not present

## 2024-05-18 DIAGNOSIS — M542 Cervicalgia: Secondary | ICD-10-CM | POA: Diagnosis not present

## 2024-05-18 DIAGNOSIS — E785 Hyperlipidemia, unspecified: Secondary | ICD-10-CM

## 2024-05-18 MED ORDER — ROSUVASTATIN CALCIUM 10 MG PO TABS: 10.0000 mg | ORAL_TABLET | Freq: Every day | ORAL | 3 refills | Status: AC

## 2024-05-18 MED ORDER — IPRATROPIUM BROMIDE 0.03 % NA SOLN: 2.0000 | Freq: Two times a day (BID) | NASAL | 11 refills | Status: AC

## 2024-05-18 NOTE — Progress Notes (Unsigned)
 Established Patient Office Visit  Patient ID: Lorraine Woods, female    DOB: 1945/11/09  Age: 78 y.o. MRN: [id number] PCP: Alvan Dorothyann BIRCH, MD  Chief Complaint  Patient presents with   Anxiety    Subjective:     HPI  Discussed the use of AI scribe software for clinical note transcription with the patient, who gave verbal consent to proceed.  History of Present Illness Lorraine Woods is a 78 year old female who presents with difficulty in word finding and neck pain.  Aphasia and word-finding difficulty - Difficulty with word finding for the past couple of months - Pauses and inability to recall names of objects, such as 'soap dispenser', instead using descriptive phrases like 'the thing to wash your hands' - No recent falls, changes in medication, or significant stress at onset of symptoms  Neck pain - Persistent neck pain for an extended period - No significant relief with changes in computer setup or neck exercises - Applies liquid painkiller to neck in the morning - Receives monthly massages, which help with muscle tightness - No prior neck x-rays - No physical therapy specifically for neck, but has done physical therapy for knee and balance issues in the past - Occasional lower back pain  Urinary incontinence - Urinary leakage before reaching the toilet - Difficulty maintaining the sensation during Kegel exercises after a few repetitions - Wears a pad regularly due to incontinence  Intermittent cough and sneezing - Monthly episodes of coughing followed by sneezing - No associated phlegm production - History of using nasal spray for runny nose, which has been helpful  Oropharyngeal discomfort - No persistent cough or throat pain - History of tonsil stones causing right-sided sore throat, alleviated with water  pick  Hyperlipidemia management - Currently taking cholesterol medication - Significant reduction in cholesterol levels with medication   {History  (Optional):23778}  ROS    Objective:     BP (!) 114/58   Pulse 78   Ht 5' 10 (1.778 m)   Wt 185 lb 8 oz (84.1 kg)   SpO2 99%   BMI 26.62 kg/m  {Vitals History (Optional):23777}  Physical Exam Vitals and nursing note reviewed.  Constitutional:      Appearance: Normal appearance.  HENT:     Head: Normocephalic and atraumatic.  Eyes:     Conjunctiva/sclera: Conjunctivae normal.  Cardiovascular:     Rate and Rhythm: Normal rate and regular rhythm.  Pulmonary:     Effort: Pulmonary effort is normal.     Breath sounds: Normal breath sounds.  Skin:    General: Skin is warm and dry.  Neurological:     Mental Status: She is alert.  Psychiatric:        Mood and Affect: Mood normal.     {PhysExam Abridge (Optional):210964309} No results found for any visits on 05/18/24.  {Labs (Optional):23779}  The ASCVD Risk score (Arnett DK, et al., 2019) failed to calculate for the following reasons:   The valid total cholesterol range is 130 to 320 mg/dL    Assessment & Plan:   Problem List Items Addressed This Visit       Other   Mixed stress and urge urinary incontinence   Hyperlipidemia   Relevant Medications   rosuvastatin  (CRESTOR ) 10 MG tablet   Other Relevant Orders   CMP14+EGFR   Lipid Panel With LDL/HDL Ratio   Other Visit Diagnoses       Word finding difficulty    -  Primary  Rhinorrhea       Relevant Medications   ipratropium (ATROVENT ) 0.03 % nasal spray       Assessment and Plan Assessment & Plan Word-finding difficulty Performed memory test.  Cervicalgia Chronic neck pain persists despite ergonomic adjustments and exercises. Previous chiropractic care helped migraines. - Consider x-rays if symptoms persist. - Consider referral to chiropractor if symptoms persist. - Continue topical pain relief and Tylenol .  Low back pain Intermittent low back pain associated with neck pain. No recent imaging or specialist evaluation. - Consider x-rays if  symptoms persist. - Continue topical pain relief and Tylenol .  Mixed urinary incontinence Urgency and stress components. Discussed pelvic floor exercises and potential referral if no improvement. - Perform Kegel exercises multiple times a day. - Try stopping urination midstream to assess pelvic floor strength. - Consider referral to pelvic physical therapy or urogynecologist if no improvement in a couple of months.  Hyperlipidemia LDL levels decreased significantly. - Ordered blood work to assess current cholesterol levels.  General Health Maintenance Routine blood work due as it has been six months since last test. - Ordered routine blood work.    Return in about 6 months (around 11/16/2024) for mood.    Dorothyann Byars, MD [facility] at [facility]

## 2024-05-18 NOTE — Assessment & Plan Note (Signed)
 MOCA 28/20, missed points on clock drawing.  Started 2 months ago.

## 2024-05-19 ENCOUNTER — Other Ambulatory Visit: Payer: Self-pay | Admitting: Family Medicine

## 2024-05-19 ENCOUNTER — Ambulatory Visit: Payer: Self-pay | Admitting: Family Medicine

## 2024-05-19 ENCOUNTER — Encounter: Payer: Self-pay | Admitting: Family Medicine

## 2024-05-19 DIAGNOSIS — R413 Other amnesia: Secondary | ICD-10-CM

## 2024-05-19 DIAGNOSIS — R4789 Other speech disturbances: Secondary | ICD-10-CM

## 2024-05-19 LAB — CMP14+EGFR
ALT: 15 IU/L (ref 0–32)
AST: 25 IU/L (ref 0–40)
Albumin: 4.3 g/dL (ref 3.8–4.8)
Alkaline Phosphatase: 48 IU/L — ABNORMAL LOW (ref 49–135)
BUN/Creatinine Ratio: 17 (ref 12–28)
BUN: 14 mg/dL (ref 8–27)
Bilirubin Total: 0.8 mg/dL (ref 0.0–1.2)
CO2: 24 mmol/L (ref 20–29)
Calcium: 9.5 mg/dL (ref 8.7–10.3)
Chloride: 103 mmol/L (ref 96–106)
Creatinine, Ser: 0.82 mg/dL (ref 0.57–1.00)
Globulin, Total: 2.4 g/dL (ref 1.5–4.5)
Glucose: 97 mg/dL (ref 70–99)
Potassium: 3.7 mmol/L (ref 3.5–5.2)
Sodium: 143 mmol/L (ref 134–144)
Total Protein: 6.7 g/dL (ref 6.0–8.5)
eGFR: 74 mL/min/1.73 (ref >59)

## 2024-05-19 LAB — LIPID PANEL WITH LDL/HDL RATIO
Cholesterol, Total: 157 mg/dL (ref 100–199)
HDL: 64 mg/dL (ref >39)
LDL Chol Calc (NIH): 75 mg/dL (ref 0–99)
LDL/HDL Ratio: 1.2 ratio (ref 0.0–3.2)
Triglycerides: 102 mg/dL (ref 0–149)
VLDL Cholesterol Cal: 18 mg/dL (ref 5–40)

## 2024-05-19 NOTE — Assessment & Plan Note (Signed)
 Cervicalgia Chronic neck pain persists despite ergonomic adjustments and exercises. Previous chiropractic care helped migraines. - Consider x-rays if symptoms persist. - Consider referral to chiropractor if symptoms persist. - Continue topical pain relief and Tylenol .

## 2024-05-19 NOTE — Assessment & Plan Note (Signed)
 Mixed urinary incontinence Urgency and stress components. Discussed pelvic floor exercises and potential referral if no improvement. - Perform Kegel exercises multiple times a day. - Try stopping urination midstream to assess pelvic floor strength. - Consider referral to pelvic physical therapy or urogynecologist if no improvement in a couple of months.

## 2024-05-19 NOTE — Progress Notes (Signed)
 Orders Placed This Encounter  Procedures   MR Brain W Wo Contrast    Standing Status:   Future    Expiration Date:   05/19/2025    If indicated for the ordered procedure, I authorize the administration of contrast media per Radiology protocol:   Yes    What is the patient's sedation requirement?:   No Sedation    Does the patient have a pacemaker or implanted devices?:   No    Preferred imaging location?:   [facility] (table limit-500lbs)

## 2024-05-19 NOTE — Assessment & Plan Note (Signed)
°  Hyperlipidemia LDL levels decreased significantly. - Ordered blood work to assess current cholesterol levels.

## 2024-05-19 NOTE — Progress Notes (Signed)
 Hi Lorraine Woods, electrolytes are normal.  Your alkaline phosphatase is on the low end but that is not concerning we only care if it is elevated.  The rest of the liver enzymes are normal.  Cholesterol overall looks good.  How would you feel about doing some brain imaging and you had mentioned feeling like you are having some difficulty with word finding over the last couple of months without any other specific causes or triggers I would like to consider brain MRI for further workup.

## 2024-06-01 ENCOUNTER — Ambulatory Visit

## 2024-06-01 DIAGNOSIS — R413 Other amnesia: Secondary | ICD-10-CM | POA: Diagnosis not present

## 2024-06-01 DIAGNOSIS — R4789 Other speech disturbances: Secondary | ICD-10-CM | POA: Diagnosis not present

## 2024-06-01 MED ORDER — GADOBUTROL 1 MMOL/ML IV SOLN
8.5000 mL | Freq: Once | INTRAVENOUS | Status: AC | PRN
  Administered 2024-06-01: 8.5 mL via INTRAVENOUS

## 2024-06-08 ENCOUNTER — Encounter: Payer: Self-pay | Admitting: Family Medicine

## 2024-06-10 ENCOUNTER — Ambulatory Visit: Payer: Self-pay | Admitting: Family Medicine

## 2024-06-10 NOTE — Progress Notes (Signed)
 Hi Lorraine Woods, brain MRI honestly looks good they did not see any sign of stroke or old stroke or mass or extra fluid.  So this is very reassuring.

## 2024-07-07 ENCOUNTER — Telehealth: Payer: Self-pay

## 2024-07-07 DIAGNOSIS — D509 Iron deficiency anemia, unspecified: Secondary | ICD-10-CM

## 2024-07-07 NOTE — Telephone Encounter (Signed)
-----   Message from [facility] sent at 02/06/2024  8:34 AM EDT ----- Dr. DELENA wants patient to have repeat CBC and TIBC/ferritin in 07/2024. ----- Message ----- From: Leigh Elspeth SQUIBB, MD Sent: 02/05/2024   5:19 PM EDT To: Alan LITTIE Fusi, CMA  I think we can do recall for CBC and TIBC / ferritin next February 2026. Thanks for helping to cover Jan for this. ----- Message ----- From: Fusi Alan LITTIE, CMA Sent: 02/05/2024  10:01 AM EDT To: Elspeth SQUIBB Leigh, MD  Good morning Dr. DELENA, Jan had a reminder for this patient to have repeat CBC and TIBC in 6 months. Looks like the patient just had a CBC in July that her PCP ordered. Do you want me just have her come in for a TIBC or do you want to repeat the CBC also? Thanks.

## 2024-09-29 ENCOUNTER — Ambulatory Visit

## 2024-11-16 ENCOUNTER — Ambulatory Visit: Admitting: Family Medicine
